# Patient Record
Sex: Male | Born: 1940 | Race: White | Hispanic: No | Marital: Married | Smoking: Former smoker
Health system: Southern US, Community
[De-identification: ages and names within clinical notes are randomized; demographics above are authoritative.]

## PROBLEM LIST (undated history)

## (undated) DIAGNOSIS — L02419 Cutaneous abscess of limb, unspecified: Secondary | ICD-10-CM

## (undated) DIAGNOSIS — I10 Essential (primary) hypertension: Secondary | ICD-10-CM

## (undated) DIAGNOSIS — E1142 Type 2 diabetes mellitus with diabetic polyneuropathy: Secondary | ICD-10-CM

## (undated) DIAGNOSIS — C9002 Multiple myeloma in relapse: Secondary | ICD-10-CM

## (undated) DIAGNOSIS — L03119 Cellulitis of unspecified part of limb: Secondary | ICD-10-CM

## (undated) DIAGNOSIS — N183 Chronic kidney disease, stage 3 unspecified: Secondary | ICD-10-CM

## (undated) DIAGNOSIS — K439 Ventral hernia without obstruction or gangrene: Secondary | ICD-10-CM

## (undated) DIAGNOSIS — M8448XA Pathological fracture, other site, initial encounter for fracture: Secondary | ICD-10-CM

## (undated) DIAGNOSIS — E1121 Type 2 diabetes mellitus with diabetic nephropathy: Secondary | ICD-10-CM

## (undated) DIAGNOSIS — R509 Fever, unspecified: Secondary | ICD-10-CM

## (undated) HISTORY — PX: BACK SURGERY: SHX140

## (undated) HISTORY — DX: Type 2 diabetes mellitus with diabetic nephropathy: E11.21

## (undated) HISTORY — DX: Type 2 diabetes mellitus with diabetic polyneuropathy: E11.42

## (undated) HISTORY — DX: Essential (primary) hypertension: I10

## (undated) HISTORY — DX: Multiple myeloma in relapse: C90.02

## (undated) HISTORY — DX: Pathological fracture, other site, initial encounter for fracture: M84.48XA

---

## 2006-09-08 ENCOUNTER — Encounter (INDEPENDENT_AMBULATORY_CARE_PROVIDER_SITE_OTHER): Payer: Self-pay | Admitting: *Deleted

## 2006-09-08 ENCOUNTER — Ambulatory Visit (HOSPITAL_COMMUNITY): Admission: RE | Admit: 2006-09-08 | Discharge: 2006-09-08 | Payer: Self-pay | Admitting: Neurosurgery

## 2006-09-08 ENCOUNTER — Ambulatory Visit: Payer: Self-pay | Admitting: Oncology

## 2006-09-11 LAB — CBC WITH DIFFERENTIAL/PLATELET
Basophils Absolute: 0 10*3/uL (ref 0.0–0.1)
EOS%: 0 % (ref 0.0–7.0)
Eosinophils Absolute: 0 10*3/uL (ref 0.0–0.5)
HGB: 11.9 g/dL — ABNORMAL LOW (ref 13.0–17.1)
LYMPH%: 5.1 % — ABNORMAL LOW (ref 14.0–48.0)
MCH: 29.2 pg (ref 28.0–33.4)
MCV: 85.3 fL (ref 81.6–98.0)
MONO%: 5.5 % (ref 0.0–13.0)
NEUT#: 12.4 10*3/uL — ABNORMAL HIGH (ref 1.5–6.5)
Platelets: 231 10*3/uL (ref 145–400)
RBC: 4.05 10*6/uL — ABNORMAL LOW (ref 4.20–5.71)
RDW: 14.2 % (ref 11.2–14.6)

## 2006-09-11 LAB — MORPHOLOGY

## 2006-09-11 LAB — ERYTHROCYTE SEDIMENTATION RATE: Sed Rate: 65 mm/hr — ABNORMAL HIGH (ref 0–20)

## 2006-09-11 LAB — CHCC SMEAR

## 2006-09-13 LAB — COMPREHENSIVE METABOLIC PANEL
AST: 16 U/L (ref 0–37)
Albumin: 3.1 g/dL — ABNORMAL LOW (ref 3.5–5.2)
Alkaline Phosphatase: 114 U/L (ref 39–117)
BUN: 65 mg/dL — ABNORMAL HIGH (ref 6–23)
Calcium: 8.4 mg/dL (ref 8.4–10.5)
Chloride: 98 mEq/L (ref 96–112)
Glucose, Bld: 336 mg/dL — ABNORMAL HIGH (ref 70–99)
Potassium: 4 mEq/L (ref 3.5–5.3)
Sodium: 133 mEq/L — ABNORMAL LOW (ref 135–145)
Total Protein: 6.8 g/dL (ref 6.0–8.3)

## 2006-09-13 LAB — ANA: Anti Nuclear Antibody(ANA): NEGATIVE

## 2006-09-13 LAB — IMMUNOFIXATION ELECTROPHORESIS
IgG (Immunoglobin G), Serum: 1720 mg/dL — ABNORMAL HIGH (ref 694–1618)
Total Protein, Serum Electrophoresis: 6.8 g/dL (ref 6.0–8.3)

## 2006-09-13 LAB — KAPPA/LAMBDA LIGHT CHAINS
Kappa:Lambda Ratio: 3.44 — ABNORMAL HIGH (ref 0.26–1.65)
Lambda Free Lght Chn: 3.08 mg/dL — ABNORMAL HIGH (ref 0.57–2.63)

## 2006-09-13 LAB — BETA 2 MICROGLOBULIN, SERUM: Beta-2 Microglobulin: 3.69 mg/L — ABNORMAL HIGH (ref 1.01–1.73)

## 2006-09-18 ENCOUNTER — Ambulatory Visit: Payer: Self-pay | Admitting: Oncology

## 2006-09-18 ENCOUNTER — Encounter (INDEPENDENT_AMBULATORY_CARE_PROVIDER_SITE_OTHER): Payer: Self-pay | Admitting: Specialist

## 2006-09-18 ENCOUNTER — Other Ambulatory Visit: Admission: RE | Admit: 2006-09-18 | Discharge: 2006-09-18 | Payer: Self-pay | Admitting: Oncology

## 2006-09-18 LAB — CBC WITH DIFFERENTIAL/PLATELET
BASO%: 0.3 % (ref 0.0–2.0)
EOS%: 1.1 % (ref 0.0–7.0)
HGB: 12.1 g/dL — ABNORMAL LOW (ref 13.0–17.1)
MCH: 29.5 pg (ref 28.0–33.4)
MCV: 84.7 fL (ref 81.6–98.0)
MONO%: 7.5 % (ref 0.0–13.0)
RBC: 4.09 10*6/uL — ABNORMAL LOW (ref 4.20–5.71)
RDW: 14.2 % (ref 11.2–14.6)
lymph#: 1.1 10*3/uL (ref 0.9–3.3)

## 2006-09-20 LAB — BASIC METABOLIC PANEL
Chloride: 102 mEq/L (ref 96–112)
Creatinine, Ser: 1.6 mg/dL — ABNORMAL HIGH (ref 0.40–1.50)
Sodium: 140 mEq/L (ref 135–145)

## 2006-09-21 ENCOUNTER — Ambulatory Visit: Admission: RE | Admit: 2006-09-21 | Discharge: 2006-11-17 | Payer: Self-pay | Admitting: Radiation Oncology

## 2006-09-21 LAB — UIFE/LIGHT CHAINS/TP QN, 24-HR UR
Albumin, U: DETECTED
Alpha 1, Urine: DETECTED — AB
Alpha 2, Urine: DETECTED — AB
Beta, Urine: DETECTED — AB
Free Kappa/Lambda Ratio: 10.16 ratio — ABNORMAL HIGH (ref 0.46–4.00)
Free Lt Chn Excr Rate: 410.13 mg/d
Total Protein, Urine-Ur/day: 1573 mg/d — ABNORMAL HIGH (ref 10–140)
Total Protein, Urine: 74 mg/dL

## 2006-09-21 LAB — CREATININE CLEARANCE, URINE, 24 HOUR
Collection Interval-CRCL: 24 hours
Creatinine Clearance: 84 mL/min (ref 75–125)
Creatinine: 1.6 mg/dL — ABNORMAL HIGH (ref 0.40–1.50)
Urine Total Volume-CRCL: 2125 mL

## 2006-10-04 ENCOUNTER — Ambulatory Visit (HOSPITAL_COMMUNITY): Admission: RE | Admit: 2006-10-04 | Discharge: 2006-10-04 | Payer: Self-pay | Admitting: Radiation Oncology

## 2006-10-09 LAB — CBC WITH DIFFERENTIAL/PLATELET
BASO%: 0.2 % (ref 0.0–2.0)
EOS%: 2 % (ref 0.0–7.0)
HCT: 35 % — ABNORMAL LOW (ref 38.7–49.9)
LYMPH%: 8.4 % — ABNORMAL LOW (ref 14.0–48.0)
MCH: 28.9 pg (ref 28.0–33.4)
MCHC: 34.4 g/dL (ref 32.0–35.9)
NEUT%: 86.6 % — ABNORMAL HIGH (ref 40.0–75.0)
lymph#: 0.9 10*3/uL (ref 0.9–3.3)

## 2006-10-09 LAB — PROTIME-INR: Protime: 14.4 Seconds — ABNORMAL HIGH (ref 10.6–13.4)

## 2006-10-09 LAB — BASIC METABOLIC PANEL
BUN: 35 mg/dL — ABNORMAL HIGH (ref 6–23)
CO2: 25 mEq/L (ref 19–32)
Chloride: 99 mEq/L (ref 96–112)
Glucose, Bld: 182 mg/dL — ABNORMAL HIGH (ref 70–99)
Potassium: 3.5 mEq/L (ref 3.5–5.3)
Sodium: 139 mEq/L (ref 135–145)

## 2006-10-16 LAB — PROTIME-INR: Protime: 16.8 Seconds — ABNORMAL HIGH (ref 10.6–13.4)

## 2006-10-16 LAB — BASIC METABOLIC PANEL
BUN: 38 mg/dL — ABNORMAL HIGH (ref 6–23)
CO2: 24 mEq/L (ref 19–32)
Calcium: 8.4 mg/dL (ref 8.4–10.5)
Glucose, Bld: 218 mg/dL — ABNORMAL HIGH (ref 70–99)
Potassium: 3.2 mEq/L — ABNORMAL LOW (ref 3.5–5.3)
Sodium: 134 mEq/L — ABNORMAL LOW (ref 135–145)

## 2006-10-16 LAB — CBC WITH DIFFERENTIAL/PLATELET
BASO%: 0.1 % (ref 0.0–2.0)
HCT: 31.7 % — ABNORMAL LOW (ref 38.7–49.9)
LYMPH%: 5.6 % — ABNORMAL LOW (ref 14.0–48.0)
MCHC: 34.4 g/dL (ref 32.0–35.9)
MCV: 83.6 fL (ref 81.6–98.0)
MONO%: 8.1 % (ref 0.0–13.0)
NEUT%: 84.4 % — ABNORMAL HIGH (ref 40.0–75.0)
Platelets: 137 10*3/uL — ABNORMAL LOW (ref 145–400)
RBC: 3.79 10*6/uL — ABNORMAL LOW (ref 4.20–5.71)

## 2006-10-19 ENCOUNTER — Ambulatory Visit: Payer: Self-pay | Admitting: Oncology

## 2006-10-23 LAB — COMPREHENSIVE METABOLIC PANEL
ALT: 19 U/L (ref 0–53)
AST: 13 U/L (ref 0–37)
Creatinine, Ser: 1.15 mg/dL (ref 0.40–1.50)
Total Bilirubin: 0.3 mg/dL (ref 0.3–1.2)

## 2006-10-23 LAB — CBC WITH DIFFERENTIAL/PLATELET
BASO%: 0.1 % (ref 0.0–2.0)
EOS%: 2.1 % (ref 0.0–7.0)
HCT: 32 % — ABNORMAL LOW (ref 38.7–49.9)
LYMPH%: 8.2 % — ABNORMAL LOW (ref 14.0–48.0)
MCH: 28.7 pg (ref 28.0–33.4)
MCHC: 34.3 g/dL (ref 32.0–35.9)
MONO#: 0.6 10*3/uL (ref 0.1–0.9)
NEUT%: 82.4 % — ABNORMAL HIGH (ref 40.0–75.0)
Platelets: 175 10*3/uL (ref 145–400)
RBC: 3.83 10*6/uL — ABNORMAL LOW (ref 4.20–5.71)
WBC: 8.3 10*3/uL (ref 4.0–10.0)

## 2006-10-23 LAB — PROTIME-INR
INR: 2 (ref 2.00–3.50)
Protime: 24 Seconds — ABNORMAL HIGH (ref 10.6–13.4)

## 2006-10-23 LAB — MORPHOLOGY: PLT EST: ADEQUATE

## 2006-10-23 LAB — LACTATE DEHYDROGENASE: LDH: 166 U/L (ref 94–250)

## 2006-10-30 LAB — BASIC METABOLIC PANEL
BUN: 27 mg/dL — ABNORMAL HIGH (ref 6–23)
CO2: 24 mEq/L (ref 19–32)
Chloride: 98 mEq/L (ref 96–112)
Creatinine, Ser: 1.2 mg/dL (ref 0.40–1.50)
Potassium: 3.7 mEq/L (ref 3.5–5.3)

## 2006-10-30 LAB — CBC WITH DIFFERENTIAL/PLATELET
Basophils Absolute: 0 10*3/uL (ref 0.0–0.1)
EOS%: 2 % (ref 0.0–7.0)
HCT: 32.4 % — ABNORMAL LOW (ref 38.7–49.9)
HGB: 11 g/dL — ABNORMAL LOW (ref 13.0–17.1)
MCH: 28.4 pg (ref 28.0–33.4)
MCV: 83.7 fL (ref 81.6–98.0)
NEUT%: 77.4 % — ABNORMAL HIGH (ref 40.0–75.0)
lymph#: 1.1 10*3/uL (ref 0.9–3.3)

## 2006-11-14 LAB — CBC WITH DIFFERENTIAL/PLATELET
Basophils Absolute: 0 10*3/uL (ref 0.0–0.1)
Eosinophils Absolute: 0.7 10*3/uL — ABNORMAL HIGH (ref 0.0–0.5)
HGB: 10.8 g/dL — ABNORMAL LOW (ref 13.0–17.1)
LYMPH%: 11.3 % — ABNORMAL LOW (ref 14.0–48.0)
MCV: 84.3 fL (ref 81.6–98.0)
MONO#: 0.6 10*3/uL (ref 0.1–0.9)
MONO%: 6.9 % (ref 0.0–13.0)
NEUT#: 6 10*3/uL (ref 1.5–6.5)
Platelets: 210 10*3/uL (ref 145–400)
WBC: 8.3 10*3/uL (ref 4.0–10.0)

## 2006-11-14 LAB — PROTHROMBIN TIME
INR: 1.2 (ref 0.0–1.5)
Prothrombin Time: 15.6 seconds — ABNORMAL HIGH (ref 11.6–15.2)

## 2006-11-21 LAB — CBC WITH DIFFERENTIAL/PLATELET
Basophils Absolute: 0.1 10*3/uL (ref 0.0–0.1)
EOS%: 5.7 % (ref 0.0–7.0)
Eosinophils Absolute: 0.4 10*3/uL (ref 0.0–0.5)
HCT: 32.1 % — ABNORMAL LOW (ref 38.7–49.9)
HGB: 10.9 g/dL — ABNORMAL LOW (ref 13.0–17.1)
MCH: 28.6 pg (ref 28.0–33.4)
MCV: 84.5 fL (ref 81.6–98.0)
NEUT#: 5.2 10*3/uL (ref 1.5–6.5)
NEUT%: 70.1 % (ref 40.0–75.0)
RDW: 16.4 % — ABNORMAL HIGH (ref 11.2–14.6)
lymph#: 0.9 10*3/uL (ref 0.9–3.3)

## 2006-11-21 LAB — MORPHOLOGY: PLT EST: ADEQUATE

## 2006-11-21 LAB — PROTHROMBIN TIME
INR: 2.3 — ABNORMAL HIGH (ref 0.0–1.5)
Prothrombin Time: 26.7 seconds — ABNORMAL HIGH (ref 11.6–15.2)

## 2006-11-24 LAB — COMPREHENSIVE METABOLIC PANEL
ALT: 11 U/L (ref 0–53)
AST: 8 U/L (ref 0–37)
Alkaline Phosphatase: 80 U/L (ref 39–117)
Creatinine, Ser: 1.44 mg/dL (ref 0.40–1.50)
Sodium: 134 mEq/L — ABNORMAL LOW (ref 135–145)
Total Bilirubin: 0.5 mg/dL (ref 0.3–1.2)
Total Protein: 5.8 g/dL — ABNORMAL LOW (ref 6.0–8.3)

## 2006-11-24 LAB — KAPPA/LAMBDA LIGHT CHAINS
Kappa free light chain: 6.58 mg/dL — ABNORMAL HIGH (ref 0.33–1.94)
Kappa:Lambda Ratio: 1.65 (ref 0.26–1.65)
Lambda Free Lght Chn: 3.99 mg/dL — ABNORMAL HIGH (ref 0.57–2.63)

## 2006-11-24 LAB — LACTATE DEHYDROGENASE: LDH: 113 U/L (ref 94–250)

## 2006-11-30 ENCOUNTER — Ambulatory Visit: Payer: Self-pay | Admitting: Oncology

## 2006-12-05 LAB — CBC WITH DIFFERENTIAL/PLATELET
BASO%: 0.2 % (ref 0.0–2.0)
EOS%: 1.5 % (ref 0.0–7.0)
HCT: 32.7 % — ABNORMAL LOW (ref 38.7–49.9)
MCH: 28.8 pg (ref 28.0–33.4)
MCHC: 34.4 g/dL (ref 32.0–35.9)
NEUT%: 79.7 % — ABNORMAL HIGH (ref 40.0–75.0)
RBC: 3.91 10*6/uL — ABNORMAL LOW (ref 4.20–5.71)
WBC: 7.2 10*3/uL (ref 4.0–10.0)
lymph#: 1.1 10*3/uL (ref 0.9–3.3)

## 2006-12-05 LAB — PROTIME-INR: INR: 2.7 (ref 2.00–3.50)

## 2006-12-05 LAB — MORPHOLOGY

## 2006-12-26 LAB — CBC WITH DIFFERENTIAL/PLATELET
BASO%: 0.8 % (ref 0.0–2.0)
EOS%: 2.2 % (ref 0.0–7.0)
HGB: 10.6 g/dL — ABNORMAL LOW (ref 13.0–17.1)
MCH: 28.2 pg (ref 28.0–33.4)
MCHC: 34.2 g/dL (ref 32.0–35.9)
RDW: 18.1 % — ABNORMAL HIGH (ref 11.2–14.6)
WBC: 4.8 10*3/uL (ref 4.0–10.0)
lymph#: 0.8 10*3/uL — ABNORMAL LOW (ref 0.9–3.3)

## 2006-12-26 LAB — MORPHOLOGY

## 2006-12-26 LAB — PROTIME-INR: INR: 1.9 — ABNORMAL LOW (ref 2.00–3.50)

## 2006-12-28 LAB — COMPREHENSIVE METABOLIC PANEL
BUN: 33 mg/dL — ABNORMAL HIGH (ref 6–23)
CO2: 30 mEq/L (ref 19–32)
Creatinine, Ser: 1.47 mg/dL (ref 0.40–1.50)
Glucose, Bld: 163 mg/dL — ABNORMAL HIGH (ref 70–99)
Total Bilirubin: 0.3 mg/dL (ref 0.3–1.2)

## 2006-12-28 LAB — KAPPA/LAMBDA LIGHT CHAINS
Kappa free light chain: 4.17 mg/dL — ABNORMAL HIGH (ref 0.33–1.94)
Kappa:Lambda Ratio: 1.55 (ref 0.26–1.65)
Lambda Free Lght Chn: 2.69 mg/dL — ABNORMAL HIGH (ref 0.57–2.63)

## 2006-12-28 LAB — LACTATE DEHYDROGENASE: LDH: 75 U/L — ABNORMAL LOW (ref 94–250)

## 2006-12-28 LAB — BETA 2 MICROGLOBULIN, SERUM: Beta-2 Microglobulin: 3.12 mg/L — ABNORMAL HIGH (ref 1.01–1.73)

## 2006-12-29 LAB — UIFE/LIGHT CHAINS/TP QN, 24-HR UR
Albumin, U: DETECTED
Alpha 1, Urine: DETECTED — AB
Alpha 2, Urine: DETECTED — AB
Beta, Urine: DETECTED — AB
Free Kappa Lt Chains,Ur: 12.2 mg/dL — ABNORMAL HIGH (ref 0.04–1.51)
Free Kappa/Lambda Ratio: 11.62 ratio — ABNORMAL HIGH (ref 0.46–4.00)
Free Lambda Excretion/Day: 25.99 mg/d
Free Lambda Lt Chains,Ur: 1.05 mg/dL — ABNORMAL HIGH (ref 0.08–1.01)
Free Lt Chn Excr Rate: 301.95 mg/d
Gamma Globulin, Urine: DETECTED — AB
Time: 24 hours
Total Protein, Urine-Ur/day: 1673 mg/d — ABNORMAL HIGH (ref 10–140)
Total Protein, Urine: 67.6 mg/dL
Volume, Urine: 2475 mL

## 2007-01-05 ENCOUNTER — Encounter: Admission: RE | Admit: 2007-01-05 | Discharge: 2007-01-05 | Payer: Self-pay | Admitting: Oncology

## 2007-01-22 ENCOUNTER — Ambulatory Visit: Payer: Self-pay | Admitting: Oncology

## 2007-01-24 LAB — PROTIME-INR

## 2007-02-05 ENCOUNTER — Inpatient Hospital Stay (HOSPITAL_COMMUNITY): Admission: RE | Admit: 2007-02-05 | Discharge: 2007-02-08 | Payer: Self-pay | Admitting: Neurosurgery

## 2007-02-05 ENCOUNTER — Encounter (INDEPENDENT_AMBULATORY_CARE_PROVIDER_SITE_OTHER): Payer: Self-pay | Admitting: *Deleted

## 2007-02-07 ENCOUNTER — Ambulatory Visit: Payer: Self-pay | Admitting: Oncology

## 2007-02-19 LAB — MORPHOLOGY

## 2007-02-19 LAB — CBC WITH DIFFERENTIAL/PLATELET
BASO%: 0 % (ref 0.0–2.0)
HCT: 27.6 % — ABNORMAL LOW (ref 38.7–49.9)
MCHC: 35.1 g/dL (ref 32.0–35.9)
MONO#: 0.7 10*3/uL (ref 0.1–0.9)
NEUT%: 79.8 % — ABNORMAL HIGH (ref 40.0–75.0)
RBC: 3.27 10*6/uL — ABNORMAL LOW (ref 4.20–5.71)
WBC: 7.7 10*3/uL (ref 4.0–10.0)
lymph#: 0.8 10*3/uL — ABNORMAL LOW (ref 0.9–3.3)

## 2007-02-22 LAB — KAPPA/LAMBDA LIGHT CHAINS
Kappa:Lambda Ratio: 2.07 — ABNORMAL HIGH (ref 0.26–1.65)
Lambda Free Lght Chn: 2.21 mg/dL (ref 0.57–2.63)

## 2007-02-22 LAB — COMPREHENSIVE METABOLIC PANEL
AST: 12 U/L (ref 0–37)
Albumin: 3.3 g/dL — ABNORMAL LOW (ref 3.5–5.2)
Alkaline Phosphatase: 97 U/L (ref 39–117)
Calcium: 8.7 mg/dL (ref 8.4–10.5)
Chloride: 102 mEq/L (ref 96–112)
Glucose, Bld: 214 mg/dL — ABNORMAL HIGH (ref 70–99)
Potassium: 4 mEq/L (ref 3.5–5.3)
Sodium: 139 mEq/L (ref 135–145)
Total Protein: 6 g/dL (ref 6.0–8.3)

## 2007-02-22 LAB — PROTHROMBIN TIME: Prothrombin Time: 17.5 seconds — ABNORMAL HIGH (ref 11.6–15.2)

## 2007-02-22 LAB — IMMUNOFIXATION ELECTROPHORESIS

## 2007-02-22 LAB — BETA 2 MICROGLOBULIN, SERUM: Beta-2 Microglobulin: 3.11 mg/L — ABNORMAL HIGH (ref 1.01–1.73)

## 2007-02-27 LAB — PROTIME-INR
INR: 2.4 (ref 2.00–3.50)
Protime: 28.8 Seconds — ABNORMAL HIGH (ref 10.6–13.4)

## 2007-03-09 ENCOUNTER — Ambulatory Visit: Payer: Self-pay | Admitting: Oncology

## 2007-03-13 LAB — CBC WITH DIFFERENTIAL/PLATELET
EOS%: 2 % (ref 0.0–7.0)
MCH: 29.6 pg (ref 28.0–33.4)
MCV: 83.8 fL (ref 81.6–98.0)
MONO%: 7.4 % (ref 0.0–13.0)
RBC: 3.5 10*6/uL — ABNORMAL LOW (ref 4.20–5.71)
RDW: 16.3 % — ABNORMAL HIGH (ref 11.2–14.6)

## 2007-03-13 LAB — PROTIME-INR
INR: 4.2 — ABNORMAL HIGH (ref 2.00–3.50)
Protime: 50.4 Seconds — ABNORMAL HIGH (ref 10.6–13.4)

## 2007-03-20 ENCOUNTER — Encounter: Admission: RE | Admit: 2007-03-20 | Discharge: 2007-03-20 | Payer: Self-pay | Admitting: Neurosurgery

## 2007-03-20 LAB — PROTIME-INR

## 2007-03-26 LAB — CBC WITH DIFFERENTIAL/PLATELET
Basophils Absolute: 0.1 10*3/uL (ref 0.0–0.1)
EOS%: 0.4 % (ref 0.0–7.0)
Eosinophils Absolute: 0 10*3/uL (ref 0.0–0.5)
HCT: 33.9 % — ABNORMAL LOW (ref 38.7–49.9)
HGB: 11.5 g/dL — ABNORMAL LOW (ref 13.0–17.1)
MCH: 28.7 pg (ref 28.0–33.4)
NEUT%: 83.9 % — ABNORMAL HIGH (ref 40.0–75.0)
lymph#: 0.6 10*3/uL — ABNORMAL LOW (ref 0.9–3.3)

## 2007-03-26 LAB — PROTIME-INR

## 2007-03-30 LAB — PROTIME-INR: INR: 1.9 — ABNORMAL LOW (ref 2.00–3.50)

## 2007-04-03 LAB — KAPPA/LAMBDA LIGHT CHAINS
Kappa free light chain: 3.16 mg/dL — ABNORMAL HIGH (ref 0.33–1.94)
Kappa:Lambda Ratio: 0.99 (ref 0.26–1.65)
Lambda Free Lght Chn: 3.2 mg/dL — ABNORMAL HIGH (ref 0.57–2.63)

## 2007-04-03 LAB — COMPREHENSIVE METABOLIC PANEL
Albumin: 3.3 g/dL — ABNORMAL LOW (ref 3.5–5.2)
Alkaline Phosphatase: 91 U/L (ref 39–117)
BUN: 36 mg/dL — ABNORMAL HIGH (ref 6–23)
CO2: 25 mEq/L (ref 19–32)
Calcium: 8.2 mg/dL — ABNORMAL LOW (ref 8.4–10.5)
Creatinine, Ser: 1.59 mg/dL — ABNORMAL HIGH (ref 0.40–1.50)
Sodium: 139 mEq/L (ref 135–145)

## 2007-04-10 LAB — CBC WITH DIFFERENTIAL/PLATELET
BASO%: 0.8 % (ref 0.0–2.0)
EOS%: 1.7 % (ref 0.0–7.0)
MCH: 28.9 pg (ref 28.0–33.4)
MCV: 84 fL (ref 81.6–98.0)
MONO%: 9.8 % (ref 0.0–13.0)
RBC: 3.91 10*6/uL — ABNORMAL LOW (ref 4.20–5.71)
RDW: 13.4 % (ref 11.2–14.6)

## 2007-04-17 ENCOUNTER — Encounter: Admission: RE | Admit: 2007-04-17 | Discharge: 2007-04-17 | Payer: Self-pay | Admitting: Neurosurgery

## 2007-04-23 LAB — URINALYSIS, MICROSCOPIC - CHCC
Bilirubin (Urine): NEGATIVE
Ketones: NEGATIVE mg/dL
Specific Gravity, Urine: 1.02 (ref 1.003–1.035)

## 2007-04-23 LAB — CBC WITH DIFFERENTIAL/PLATELET
BASO%: 0.8 % (ref 0.0–2.0)
Basophils Absolute: 0.1 10*3/uL (ref 0.0–0.1)
HCT: 35.9 % — ABNORMAL LOW (ref 38.7–49.9)
HGB: 12.2 g/dL — ABNORMAL LOW (ref 13.0–17.1)
LYMPH%: 8.6 % — ABNORMAL LOW (ref 14.0–48.0)
MCHC: 34.1 g/dL (ref 32.0–35.9)
MONO#: 0.1 10*3/uL (ref 0.1–0.9)
NEUT%: 87.3 % — ABNORMAL HIGH (ref 40.0–75.0)
Platelets: 133 10*3/uL — ABNORMAL LOW (ref 145–400)
WBC: 6.4 10*3/uL (ref 4.0–10.0)
lymph#: 0.5 10*3/uL — ABNORMAL LOW (ref 0.9–3.3)

## 2007-04-23 LAB — PROTIME-INR

## 2007-04-25 LAB — URINE CULTURE

## 2007-05-07 ENCOUNTER — Ambulatory Visit: Payer: Self-pay | Admitting: Oncology

## 2007-05-08 LAB — CBC WITH DIFFERENTIAL/PLATELET
Basophils Absolute: 0.1 10*3/uL (ref 0.0–0.1)
Eosinophils Absolute: 0.1 10*3/uL (ref 0.0–0.5)
HGB: 11.9 g/dL — ABNORMAL LOW (ref 13.0–17.1)
MCV: 81.6 fL (ref 81.6–98.0)
MONO%: 13.2 % — ABNORMAL HIGH (ref 0.0–13.0)
NEUT#: 3.6 10*3/uL (ref 1.5–6.5)
RBC: 4.07 10*6/uL — ABNORMAL LOW (ref 4.20–5.71)
RDW: 13.2 % (ref 11.2–14.6)
WBC: 5.2 10*3/uL (ref 4.0–10.0)
lymph#: 0.7 10*3/uL — ABNORMAL LOW (ref 0.9–3.3)

## 2007-05-08 LAB — PROTIME-INR: Protime: 18 Seconds — ABNORMAL HIGH (ref 10.6–13.4)

## 2007-05-15 LAB — CBC WITH DIFFERENTIAL/PLATELET
Basophils Absolute: 0.1 10*3/uL (ref 0.0–0.1)
Eosinophils Absolute: 0.2 10*3/uL (ref 0.0–0.5)
HGB: 12 g/dL — ABNORMAL LOW (ref 13.0–17.1)
LYMPH%: 10.7 % — ABNORMAL LOW (ref 14.0–48.0)
MCV: 82.4 fL (ref 81.6–98.0)
MONO#: 0.9 10*3/uL (ref 0.1–0.9)
NEUT#: 4.9 10*3/uL (ref 1.5–6.5)
Platelets: 173 10*3/uL (ref 145–400)
RBC: 4.17 10*6/uL — ABNORMAL LOW (ref 4.20–5.71)
RDW: 13.1 % (ref 11.2–14.6)
WBC: 6.8 10*3/uL (ref 4.0–10.0)

## 2007-05-15 LAB — MORPHOLOGY: PLT EST: ADEQUATE

## 2007-05-15 LAB — PROTIME-INR: Protime: 26.4 Seconds — ABNORMAL HIGH (ref 10.6–13.4)

## 2007-05-29 LAB — CBC WITH DIFFERENTIAL/PLATELET
BASO%: 0.6 % (ref 0.0–2.0)
EOS%: 0.3 % (ref 0.0–7.0)
HCT: 33.1 % — ABNORMAL LOW (ref 38.7–49.9)
LYMPH%: 10.5 % — ABNORMAL LOW (ref 14.0–48.0)
MCH: 29.3 pg (ref 28.0–33.4)
MCHC: 35 g/dL (ref 32.0–35.9)
MCV: 83.7 fL (ref 81.6–98.0)
MONO%: 10.8 % (ref 0.0–13.0)
NEUT%: 77.8 % — ABNORMAL HIGH (ref 40.0–75.0)
Platelets: 153 10*3/uL (ref 145–400)
RBC: 3.95 10*6/uL — ABNORMAL LOW (ref 4.20–5.71)

## 2007-05-29 LAB — PROTIME-INR
INR: 2.8 (ref 2.00–3.50)
Protime: 33.6 s — ABNORMAL HIGH (ref 10.6–13.4)

## 2007-05-29 LAB — MORPHOLOGY: PLT EST: ADEQUATE

## 2007-06-12 LAB — CBC WITH DIFFERENTIAL/PLATELET
Eosinophils Absolute: 0 10*3/uL (ref 0.0–0.5)
HCT: 33.4 % — ABNORMAL LOW (ref 38.7–49.9)
LYMPH%: 6.7 % — ABNORMAL LOW (ref 14.0–48.0)
MONO#: 0.8 10*3/uL (ref 0.1–0.9)
NEUT#: 6 10*3/uL (ref 1.5–6.5)
NEUT%: 82.1 % — ABNORMAL HIGH (ref 40.0–75.0)
Platelets: 137 10*3/uL — ABNORMAL LOW (ref 145–400)
WBC: 7.3 10*3/uL (ref 4.0–10.0)
lymph#: 0.5 10*3/uL — ABNORMAL LOW (ref 0.9–3.3)

## 2007-06-12 LAB — PROTIME-INR

## 2007-06-13 LAB — COMPREHENSIVE METABOLIC PANEL
ALT: 24 U/L (ref 0–53)
Albumin: 3.5 g/dL (ref 3.5–5.2)
Alkaline Phosphatase: 91 U/L (ref 39–117)
CO2: 20 mEq/L (ref 19–32)
Glucose, Bld: 283 mg/dL — ABNORMAL HIGH (ref 70–99)
Potassium: 4.1 mEq/L (ref 3.5–5.3)
Sodium: 137 mEq/L (ref 135–145)
Total Bilirubin: 1 mg/dL (ref 0.3–1.2)
Total Protein: 6 g/dL (ref 6.0–8.3)

## 2007-06-13 LAB — PROTIME-INR
INR: 2.9 (ref 2.00–3.50)
Protime: 34.8 Seconds — ABNORMAL HIGH (ref 10.6–13.4)

## 2007-06-27 ENCOUNTER — Encounter: Admission: RE | Admit: 2007-06-27 | Discharge: 2007-06-27 | Payer: Self-pay | Admitting: Neurosurgery

## 2007-06-27 ENCOUNTER — Ambulatory Visit: Payer: Self-pay | Admitting: Oncology

## 2007-06-27 LAB — PROTIME-INR: INR: 1.8 — ABNORMAL LOW (ref 2.00–3.50)

## 2007-07-23 ENCOUNTER — Encounter: Admission: RE | Admit: 2007-07-23 | Discharge: 2007-07-23 | Payer: Self-pay | Admitting: Neurosurgery

## 2007-07-24 ENCOUNTER — Ambulatory Visit (HOSPITAL_COMMUNITY): Admission: RE | Admit: 2007-07-24 | Discharge: 2007-07-24 | Payer: Self-pay | Admitting: Oncology

## 2007-07-24 LAB — CBC WITH DIFFERENTIAL/PLATELET
Eosinophils Absolute: 0 10*3/uL (ref 0.0–0.5)
HCT: 33.8 % — ABNORMAL LOW (ref 38.7–49.9)
LYMPH%: 8.6 % — ABNORMAL LOW (ref 14.0–48.0)
MCH: 30.9 pg (ref 28.0–33.4)
MCHC: 35.8 g/dL (ref 32.0–35.9)
MCV: 86.3 fL (ref 81.6–98.0)
MONO#: 0.6 10*3/uL (ref 0.1–0.9)

## 2007-07-24 LAB — MORPHOLOGY: PLT EST: ADEQUATE

## 2007-07-24 LAB — PROTIME-INR
INR: 1.9 — ABNORMAL LOW (ref 2.00–3.50)
Protime: 22.8 Seconds — ABNORMAL HIGH (ref 10.6–13.4)

## 2007-07-26 LAB — COMPREHENSIVE METABOLIC PANEL
ALT: 29 U/L (ref 0–53)
AST: 10 U/L (ref 0–37)
Albumin: 3.6 g/dL (ref 3.5–5.2)
Alkaline Phosphatase: 84 U/L (ref 39–117)
BUN: 39 mg/dL — ABNORMAL HIGH (ref 6–23)
Calcium: 8.6 mg/dL (ref 8.4–10.5)
Chloride: 103 mEq/L (ref 96–112)
Potassium: 4.1 mEq/L (ref 3.5–5.3)
Sodium: 139 mEq/L (ref 135–145)
Total Protein: 6.3 g/dL (ref 6.0–8.3)

## 2007-07-26 LAB — KAPPA/LAMBDA LIGHT CHAINS
Kappa free light chain: 1.68 mg/dL (ref 0.33–1.94)
Kappa:Lambda Ratio: 0.6 (ref 0.26–1.65)
Lambda Free Lght Chn: 2.78 mg/dL — ABNORMAL HIGH (ref 0.57–2.63)

## 2007-07-26 LAB — IMMUNOFIXATION ELECTROPHORESIS

## 2007-07-31 LAB — CBC WITH DIFFERENTIAL/PLATELET
BASO%: 0.8 % (ref 0.0–2.0)
HCT: 34.3 % — ABNORMAL LOW (ref 38.7–49.9)
LYMPH%: 10.3 % — ABNORMAL LOW (ref 14.0–48.0)
MCHC: 34.9 g/dL (ref 32.0–35.9)
MCV: 84.9 fL (ref 81.6–98.0)
MONO#: 0.7 10*3/uL (ref 0.1–0.9)
MONO%: 13.7 % — ABNORMAL HIGH (ref 0.0–13.0)
NEUT%: 74.8 % (ref 40.0–75.0)
Platelets: 128 10*3/uL — ABNORMAL LOW (ref 145–400)
WBC: 5.2 10*3/uL (ref 4.0–10.0)

## 2007-07-31 LAB — UIFE/LIGHT CHAINS/TP QN, 24-HR UR
Albumin, U: DETECTED
Beta, Urine: DETECTED — AB
Free Kappa Lt Chains,Ur: 11 mg/dL — ABNORMAL HIGH (ref 0.04–1.51)
Free Lambda Excretion/Day: 48.24 mg/d
Free Lambda Lt Chains,Ur: 1.34 mg/dL — ABNORMAL HIGH (ref 0.08–1.01)
Free Lt Chn Excr Rate: 396 mg/d
Gamma Globulin, Urine: DETECTED — AB
Time: 24 hours
Volume, Urine: 3600 mL

## 2007-07-31 LAB — MORPHOLOGY: PLT EST: DECREASED

## 2007-07-31 LAB — PROTIME-INR: INR: 1.7 — ABNORMAL LOW (ref 2.00–3.50)

## 2007-08-14 ENCOUNTER — Ambulatory Visit: Payer: Self-pay | Admitting: Oncology

## 2007-08-14 LAB — CBC WITH DIFFERENTIAL/PLATELET
BASO%: 0.3 % (ref 0.0–2.0)
EOS%: 0.9 % (ref 0.0–7.0)
LYMPH%: 8.4 % — ABNORMAL LOW (ref 14.0–48.0)
MCHC: 35.4 g/dL (ref 32.0–35.9)
MCV: 86.7 fL (ref 81.6–98.0)
MONO%: 12.9 % (ref 0.0–13.0)
Platelets: 160 10*3/uL (ref 145–400)
RBC: 3.8 10*6/uL — ABNORMAL LOW (ref 4.20–5.71)
RDW: 17.9 % — ABNORMAL HIGH (ref 11.2–14.6)

## 2007-08-14 LAB — MORPHOLOGY: PLT EST: ADEQUATE

## 2007-08-14 LAB — PROTHROMBIN TIME: Prothrombin Time: 21 seconds — ABNORMAL HIGH (ref 11.6–15.2)

## 2007-08-28 LAB — BASIC METABOLIC PANEL
BUN: 31 mg/dL — ABNORMAL HIGH (ref 6–23)
CO2: 23 mEq/L (ref 19–32)
Calcium: 8.8 mg/dL (ref 8.4–10.5)
Creatinine, Ser: 1.7 mg/dL — ABNORMAL HIGH (ref 0.40–1.50)
Glucose, Bld: 410 mg/dL — ABNORMAL HIGH (ref 70–99)

## 2007-08-28 LAB — CBC WITH DIFFERENTIAL/PLATELET
Basophils Absolute: 0 10*3/uL (ref 0.0–0.1)
EOS%: 0.2 % (ref 0.0–7.0)
HCT: 35.5 % — ABNORMAL LOW (ref 38.7–49.9)
HGB: 12.5 g/dL — ABNORMAL LOW (ref 13.0–17.1)
LYMPH%: 6.6 % — ABNORMAL LOW (ref 14.0–48.0)
MCH: 30.6 pg (ref 28.0–33.4)
MCV: 86.8 fL (ref 81.6–98.0)
MONO%: 11.1 % (ref 0.0–13.0)
NEUT%: 82 % — ABNORMAL HIGH (ref 40.0–75.0)
Platelets: 161 10*3/uL (ref 145–400)

## 2007-08-28 LAB — MORPHOLOGY: PLT EST: ADEQUATE

## 2007-08-28 LAB — PROTIME-INR: Protime: 56.4 Seconds — ABNORMAL HIGH (ref 10.6–13.4)

## 2007-08-30 LAB — PROTIME-INR
INR: 2.7 (ref 2.00–3.50)
Protime: 32.4 Seconds — ABNORMAL HIGH (ref 10.6–13.4)

## 2007-09-03 LAB — PROTIME-INR: INR: 1.7 — ABNORMAL LOW (ref 2.00–3.50)

## 2007-09-11 LAB — CBC WITH DIFFERENTIAL/PLATELET
BASO%: 0.2 % (ref 0.0–2.0)
EOS%: 0.3 % (ref 0.0–7.0)
HCT: 35.3 % — ABNORMAL LOW (ref 38.7–49.9)
MCH: 30.5 pg (ref 28.0–33.4)
MCHC: 35 g/dL (ref 32.0–35.9)
NEUT%: 83.5 % — ABNORMAL HIGH (ref 40.0–75.0)
RBC: 4.06 10*6/uL — ABNORMAL LOW (ref 4.20–5.71)
WBC: 5.7 10*3/uL (ref 4.0–10.0)
lymph#: 0.4 10*3/uL — ABNORMAL LOW (ref 0.9–3.3)

## 2007-09-11 LAB — COMPREHENSIVE METABOLIC PANEL
ALT: 27 U/L (ref 0–53)
Alkaline Phosphatase: 64 U/L (ref 39–117)
CO2: 21 mEq/L (ref 19–32)
Creatinine, Ser: 1.56 mg/dL — ABNORMAL HIGH (ref 0.40–1.50)
Glucose, Bld: 288 mg/dL — ABNORMAL HIGH (ref 70–99)
Total Bilirubin: 0.4 mg/dL (ref 0.3–1.2)

## 2007-09-11 LAB — PROTIME-INR: Protime: 18 Seconds — ABNORMAL HIGH (ref 10.6–13.4)

## 2007-09-12 LAB — IGG: IgG (Immunoglobin G), Serum: 633 mg/dL — ABNORMAL LOW (ref 694–1618)

## 2007-09-12 LAB — KAPPA/LAMBDA LIGHT CHAINS: Kappa free light chain: 1.21 mg/dL (ref 0.33–1.94)

## 2007-09-20 LAB — PROTIME-INR: INR: 1.6 — ABNORMAL LOW (ref 2.00–3.50)

## 2007-09-25 LAB — BASIC METABOLIC PANEL
BUN: 35 mg/dL — ABNORMAL HIGH (ref 6–23)
Chloride: 101 mEq/L (ref 96–112)
Potassium: 4 mEq/L (ref 3.5–5.3)
Sodium: 138 mEq/L (ref 135–145)

## 2007-09-30 ENCOUNTER — Ambulatory Visit: Payer: Self-pay | Admitting: Oncology

## 2007-10-03 LAB — PROTIME-INR: INR: 1.6 — ABNORMAL LOW (ref 2.00–3.50)

## 2007-10-18 LAB — PROTIME-INR
INR: 1.9 — ABNORMAL LOW (ref 2.00–3.50)
Protime: 22.8 Seconds — ABNORMAL HIGH (ref 10.6–13.4)

## 2007-10-23 LAB — COMPREHENSIVE METABOLIC PANEL
Albumin: 3.1 g/dL — ABNORMAL LOW (ref 3.5–5.2)
Alkaline Phosphatase: 71 U/L (ref 39–117)
CO2: 21 mEq/L (ref 19–32)
Calcium: 8.7 mg/dL (ref 8.4–10.5)
Chloride: 100 mEq/L (ref 96–112)
Glucose, Bld: 544 mg/dL (ref 70–99)
Potassium: 3.7 mEq/L (ref 3.5–5.3)
Sodium: 135 mEq/L (ref 135–145)
Total Protein: 6.2 g/dL (ref 6.0–8.3)

## 2007-10-23 LAB — CBC WITH DIFFERENTIAL/PLATELET
BASO%: 0.1 % (ref 0.0–2.0)
EOS%: 0 % (ref 0.0–7.0)
LYMPH%: 9 % — ABNORMAL LOW (ref 14.0–48.0)
MCHC: 35.2 g/dL (ref 32.0–35.9)
MCV: 86.2 fL (ref 81.6–98.0)
MONO%: 6.3 % (ref 0.0–13.0)
Platelets: 136 10*3/uL — ABNORMAL LOW (ref 145–400)
RBC: 4.18 10*6/uL — ABNORMAL LOW (ref 4.20–5.71)
RDW: 18 % — ABNORMAL HIGH (ref 11.2–14.6)

## 2007-10-23 LAB — PROTIME-INR: Protime: 24 Seconds — ABNORMAL HIGH (ref 10.6–13.4)

## 2007-10-24 LAB — KAPPA/LAMBDA LIGHT CHAINS: Kappa free light chain: 1.17 mg/dL (ref 0.33–1.94)

## 2007-11-16 ENCOUNTER — Ambulatory Visit: Payer: Self-pay | Admitting: Oncology

## 2007-11-20 LAB — BASIC METABOLIC PANEL
BUN: 39 mg/dL — ABNORMAL HIGH (ref 6–23)
Chloride: 102 mEq/L (ref 96–112)
Glucose, Bld: 267 mg/dL — ABNORMAL HIGH (ref 70–99)
Potassium: 3.5 mEq/L (ref 3.5–5.3)

## 2007-11-27 LAB — CBC WITH DIFFERENTIAL/PLATELET
Basophils Absolute: 0.1 10*3/uL (ref 0.0–0.1)
Eosinophils Absolute: 0.1 10*3/uL (ref 0.0–0.5)
HCT: 35.4 % — ABNORMAL LOW (ref 38.7–49.9)
HGB: 12.3 g/dL — ABNORMAL LOW (ref 13.0–17.1)
LYMPH%: 13.7 % — ABNORMAL LOW (ref 14.0–48.0)
MCV: 86.1 fL (ref 81.6–98.0)
MONO%: 6.1 % (ref 0.0–13.0)
NEUT#: 4.7 10*3/uL (ref 1.5–6.5)
NEUT%: 78 % — ABNORMAL HIGH (ref 40.0–75.0)
Platelets: 133 10*3/uL — ABNORMAL LOW (ref 145–400)

## 2007-11-27 LAB — COMPREHENSIVE METABOLIC PANEL
Albumin: 3.5 g/dL (ref 3.5–5.2)
Alkaline Phosphatase: 57 U/L (ref 39–117)
BUN: 34 mg/dL — ABNORMAL HIGH (ref 6–23)
Glucose, Bld: 213 mg/dL — ABNORMAL HIGH (ref 70–99)
Potassium: 3.5 mEq/L (ref 3.5–5.3)
Total Bilirubin: 0.5 mg/dL (ref 0.3–1.2)

## 2007-12-03 LAB — UIFE/LIGHT CHAINS/TP QN, 24-HR UR
Albumin, U: DETECTED
Alpha 1, Urine: DETECTED — AB
Alpha 2, Urine: DETECTED — AB
Beta, Urine: DETECTED — AB
Free Kappa/Lambda Ratio: 4.09 ratio — ABNORMAL HIGH (ref 0.46–4.00)
Free Lt Chn Excr Rate: 281.3 mg/d
Total Protein, Urine-Ur/day: 2894 mg/d — ABNORMAL HIGH (ref 10–140)
Total Protein, Urine: 99.8 mg/dL

## 2007-12-03 LAB — CREATININE CLEARANCE, URINE, 24 HOUR
Collection Interval-CRCL: 24 hours
Creatinine Clearance: 100 mL/min (ref 75–125)
Creatinine: 1.62 mg/dL — ABNORMAL HIGH (ref 0.40–1.50)
Urine Total Volume-CRCL: 2900 mL

## 2007-12-04 LAB — PROTIME-INR
INR: 2.1 (ref 2.00–3.50)
Protime: 25.2 Seconds — ABNORMAL HIGH (ref 10.6–13.4)

## 2007-12-18 LAB — BASIC METABOLIC PANEL
BUN: 41 mg/dL — ABNORMAL HIGH (ref 6–23)
CO2: 18 mEq/L — ABNORMAL LOW (ref 19–32)
Chloride: 103 mEq/L (ref 96–112)
Creatinine, Ser: 1.44 mg/dL (ref 0.40–1.50)
Glucose, Bld: 291 mg/dL — ABNORMAL HIGH (ref 70–99)

## 2008-01-01 ENCOUNTER — Ambulatory Visit: Payer: Self-pay | Admitting: Oncology

## 2008-01-01 LAB — CBC WITH DIFFERENTIAL/PLATELET
Basophils Absolute: 0 10*3/uL (ref 0.0–0.1)
Eosinophils Absolute: 0 10*3/uL (ref 0.0–0.5)
HCT: 34.7 % — ABNORMAL LOW (ref 38.7–49.9)
HGB: 11.9 g/dL — ABNORMAL LOW (ref 13.0–17.1)
LYMPH%: 6 % — ABNORMAL LOW (ref 14.0–48.0)
MCV: 86.9 fL (ref 81.6–98.0)
MONO#: 0.5 10*3/uL (ref 0.1–0.9)
MONO%: 9.4 % (ref 0.0–13.0)
NEUT#: 4.9 10*3/uL (ref 1.5–6.5)
Platelets: 111 10*3/uL — ABNORMAL LOW (ref 145–400)

## 2008-01-01 LAB — MORPHOLOGY: PLT EST: DECREASED

## 2008-01-02 LAB — COMPREHENSIVE METABOLIC PANEL
ALT: 30 U/L (ref 0–53)
BUN: 41 mg/dL — ABNORMAL HIGH (ref 6–23)
CO2: 21 mEq/L (ref 19–32)
Calcium: 8.5 mg/dL (ref 8.4–10.5)
Chloride: 102 mEq/L (ref 96–112)
Creatinine, Ser: 1.75 mg/dL — ABNORMAL HIGH (ref 0.40–1.50)
Glucose, Bld: 213 mg/dL — ABNORMAL HIGH (ref 70–99)

## 2008-01-02 LAB — LACTATE DEHYDROGENASE: LDH: 85 U/L — ABNORMAL LOW (ref 94–250)

## 2008-01-02 LAB — KAPPA/LAMBDA LIGHT CHAINS
Kappa:Lambda Ratio: 0.63 (ref 0.26–1.65)
Lambda Free Lght Chn: 3.33 mg/dL — ABNORMAL HIGH (ref 0.57–2.63)

## 2008-01-08 ENCOUNTER — Ambulatory Visit: Payer: Self-pay | Admitting: Oncology

## 2008-01-15 ENCOUNTER — Ambulatory Visit (HOSPITAL_COMMUNITY): Admission: RE | Admit: 2008-01-15 | Discharge: 2008-01-15 | Payer: Self-pay | Admitting: Oncology

## 2008-01-15 LAB — CBC WITH DIFFERENTIAL/PLATELET
Basophils Absolute: 0 10*3/uL (ref 0.0–0.1)
HCT: 34.3 % — ABNORMAL LOW (ref 38.7–49.9)
HGB: 11.8 g/dL — ABNORMAL LOW (ref 13.0–17.1)
LYMPH%: 9.7 % — ABNORMAL LOW (ref 14.0–48.0)
MCH: 30.1 pg (ref 28.0–33.4)
MCHC: 34.4 g/dL (ref 32.0–35.9)
MONO#: 0.5 10*3/uL (ref 0.1–0.9)
NEUT%: 79 % — ABNORMAL HIGH (ref 40.0–75.0)
Platelets: 121 10*3/uL — ABNORMAL LOW (ref 145–400)
WBC: 5.4 10*3/uL (ref 4.0–10.0)
lymph#: 0.5 10*3/uL — ABNORMAL LOW (ref 0.9–3.3)

## 2008-01-15 LAB — BASIC METABOLIC PANEL
CO2: 20 mEq/L (ref 19–32)
Chloride: 104 mEq/L (ref 96–112)
Glucose, Bld: 307 mg/dL — ABNORMAL HIGH (ref 70–99)
Sodium: 133 mEq/L — ABNORMAL LOW (ref 135–145)

## 2008-01-15 LAB — PROTIME-INR

## 2008-02-12 LAB — PROTIME-INR: Protime: 20.4 Seconds — ABNORMAL HIGH (ref 10.6–13.4)

## 2008-02-12 LAB — BASIC METABOLIC PANEL
BUN: 42 mg/dL — ABNORMAL HIGH (ref 6–23)
Calcium: 8.7 mg/dL (ref 8.4–10.5)
Creatinine, Ser: 1.73 mg/dL — ABNORMAL HIGH (ref 0.40–1.50)
Glucose, Bld: 295 mg/dL — ABNORMAL HIGH (ref 70–99)
Potassium: 4 mEq/L (ref 3.5–5.3)

## 2008-02-19 LAB — CBC WITH DIFFERENTIAL/PLATELET
BASO%: 0 % (ref 0.0–2.0)
LYMPH%: 6.9 % — ABNORMAL LOW (ref 14.0–48.0)
MCHC: 35.1 g/dL (ref 32.0–35.9)
MONO#: 0.6 10*3/uL (ref 0.1–0.9)
MONO%: 10.2 % (ref 0.0–13.0)
Platelets: 136 10*3/uL — ABNORMAL LOW (ref 145–400)
RBC: 3.71 10*6/uL — ABNORMAL LOW (ref 4.20–5.71)
WBC: 6.3 10*3/uL (ref 4.0–10.0)

## 2008-02-20 LAB — IGG: IgG (Immunoglobin G), Serum: 756 mg/dL (ref 694–1618)

## 2008-02-20 LAB — KAPPA/LAMBDA LIGHT CHAINS: Kappa:Lambda Ratio: 0.93 (ref 0.26–1.65)

## 2008-02-29 ENCOUNTER — Ambulatory Visit: Payer: Self-pay | Admitting: Oncology

## 2008-03-11 LAB — BASIC METABOLIC PANEL
BUN: 57 mg/dL — ABNORMAL HIGH (ref 6–23)
Calcium: 8.8 mg/dL (ref 8.4–10.5)
Creatinine, Ser: 2.14 mg/dL — ABNORMAL HIGH (ref 0.40–1.50)

## 2008-03-11 LAB — PROTIME-INR
INR: 1.6 — ABNORMAL LOW (ref 2.00–3.50)
Protime: 19.2 Seconds — ABNORMAL HIGH (ref 10.6–13.4)

## 2008-03-11 LAB — CBC WITH DIFFERENTIAL/PLATELET
Basophils Absolute: 0 10*3/uL (ref 0.0–0.1)
MCH: 30.2 pg (ref 28.0–33.4)
MCHC: 35 g/dL (ref 32.0–35.9)
MCV: 86.2 fL (ref 81.6–98.0)
RBC: 3.76 10*6/uL — ABNORMAL LOW (ref 4.20–5.71)
RDW: 17.4 % — ABNORMAL HIGH (ref 11.2–14.6)

## 2008-03-18 LAB — CBC WITH DIFFERENTIAL/PLATELET
BASO%: 0 % (ref 0.0–2.0)
Basophils Absolute: 0 10*3/uL (ref 0.0–0.1)
EOS%: 0.9 % (ref 0.0–7.0)
HCT: 31.3 % — ABNORMAL LOW (ref 38.7–49.9)
HGB: 11 g/dL — ABNORMAL LOW (ref 13.0–17.1)
LYMPH%: 11.3 % — ABNORMAL LOW (ref 14.0–48.0)
MCH: 30.2 pg (ref 28.0–33.4)
MCHC: 35.2 g/dL (ref 32.0–35.9)
MONO#: 0.6 10*3/uL (ref 0.1–0.9)
NEUT%: 79.1 % — ABNORMAL HIGH (ref 40.0–75.0)
Platelets: 135 10*3/uL — ABNORMAL LOW (ref 145–400)

## 2008-03-19 LAB — KAPPA/LAMBDA LIGHT CHAINS
Kappa free light chain: 2.74 mg/dL — ABNORMAL HIGH (ref 0.33–1.94)
Kappa:Lambda Ratio: 1.09 (ref 0.26–1.65)
Lambda Free Lght Chn: 2.52 mg/dL (ref 0.57–2.63)

## 2008-03-19 LAB — IGG: IgG (Immunoglobin G), Serum: 763 mg/dL (ref 694–1618)

## 2008-04-08 LAB — CBC WITH DIFFERENTIAL/PLATELET
BASO%: 0.8 % (ref 0.0–2.0)
EOS%: 1 % (ref 0.0–7.0)
HGB: 10.8 g/dL — ABNORMAL LOW (ref 13.0–17.1)
LYMPH%: 9.5 % — ABNORMAL LOW (ref 14.0–48.0)
MCV: 86.6 fL (ref 81.6–98.0)
NEUT#: 4.1 10*3/uL (ref 1.5–6.5)
NEUT%: 80.3 % — ABNORMAL HIGH (ref 40.0–75.0)
RDW: 15.5 % — ABNORMAL HIGH (ref 11.2–14.6)
WBC: 5.1 10*3/uL (ref 4.0–10.0)

## 2008-04-08 LAB — COMPREHENSIVE METABOLIC PANEL
Albumin: 3.3 g/dL — ABNORMAL LOW (ref 3.5–5.2)
Alkaline Phosphatase: 60 U/L (ref 39–117)
BUN: 40 mg/dL — ABNORMAL HIGH (ref 6–23)
Glucose, Bld: 218 mg/dL — ABNORMAL HIGH (ref 70–99)
Potassium: 4.1 mEq/L (ref 3.5–5.3)

## 2008-04-08 LAB — PROTIME-INR: Protime: 25.2 Seconds — ABNORMAL HIGH (ref 10.6–13.4)

## 2008-04-25 ENCOUNTER — Ambulatory Visit: Payer: Self-pay | Admitting: Oncology

## 2008-04-29 LAB — CBC WITH DIFFERENTIAL/PLATELET
BASO%: 0.4 % (ref 0.0–2.0)
Basophils Absolute: 0 10*3/uL (ref 0.0–0.1)
EOS%: 0.1 % (ref 0.0–7.0)
HCT: 31.6 % — ABNORMAL LOW (ref 38.7–49.9)
HGB: 11 g/dL — ABNORMAL LOW (ref 13.0–17.1)
MCH: 30.2 pg (ref 28.0–33.4)
MCHC: 34.9 g/dL (ref 32.0–35.9)
MCV: 86.5 fL (ref 81.6–98.0)
MONO%: 11.6 % (ref 0.0–13.0)
NEUT%: 79.3 % — ABNORMAL HIGH (ref 40.0–75.0)

## 2008-05-08 ENCOUNTER — Encounter: Admission: RE | Admit: 2008-05-08 | Discharge: 2008-05-08 | Payer: Self-pay | Admitting: Neurosurgery

## 2008-05-13 ENCOUNTER — Ambulatory Visit (HOSPITAL_COMMUNITY): Admission: RE | Admit: 2008-05-13 | Discharge: 2008-05-13 | Payer: Self-pay | Admitting: Oncology

## 2008-05-13 LAB — BASIC METABOLIC PANEL
Calcium: 8.9 mg/dL (ref 8.4–10.5)
Creatinine, Ser: 2.56 mg/dL — ABNORMAL HIGH (ref 0.40–1.50)

## 2008-05-13 LAB — CBC WITH DIFFERENTIAL/PLATELET
BASO%: 1.1 % (ref 0.0–2.0)
EOS%: 0.3 % (ref 0.0–7.0)
MCH: 30.3 pg (ref 28.0–33.4)
MCHC: 35.2 g/dL (ref 32.0–35.9)
RBC: 3.6 10*6/uL — ABNORMAL LOW (ref 4.20–5.71)
RDW: 15.1 % — ABNORMAL HIGH (ref 11.2–14.6)
lymph#: 0.7 10*3/uL — ABNORMAL LOW (ref 0.9–3.3)

## 2008-05-13 LAB — PROTIME-INR: INR: 1.8 — ABNORMAL LOW (ref 2.00–3.50)

## 2008-05-14 LAB — KAPPA/LAMBDA LIGHT CHAINS
Kappa free light chain: 2.04 mg/dL — ABNORMAL HIGH (ref 0.33–1.94)
Lambda Free Lght Chn: 2.43 mg/dL (ref 0.57–2.63)

## 2008-05-19 LAB — UIFE/LIGHT CHAINS/TP QN, 24-HR UR
Albumin, U: DETECTED
Alpha 1, Urine: DETECTED — AB
Beta, Urine: DETECTED — AB
Free Lambda Excretion/Day: 35 mg/d
Gamma Globulin, Urine: DETECTED — AB

## 2008-05-19 LAB — CREATININE CLEARANCE, URINE, 24 HOUR
Creatinine, 24H Ur: 2098 mg/d — ABNORMAL HIGH (ref 800–2000)
Creatinine, Urine: 83.9 mg/dL
Creatinine: 2.56 mg/dL — ABNORMAL HIGH (ref 0.40–1.50)
Urine Total Volume-CRCL: 2500 mL

## 2008-06-10 ENCOUNTER — Ambulatory Visit: Payer: Self-pay | Admitting: Oncology

## 2008-06-10 LAB — BASIC METABOLIC PANEL
BUN: 30 mg/dL — ABNORMAL HIGH (ref 6–23)
CO2: 21 mEq/L (ref 19–32)
Chloride: 106 mEq/L (ref 96–112)
Creatinine, Ser: 1.77 mg/dL — ABNORMAL HIGH (ref 0.40–1.50)
Glucose, Bld: 257 mg/dL — ABNORMAL HIGH (ref 70–99)
Potassium: 4.1 mEq/L (ref 3.5–5.3)

## 2008-06-24 LAB — MORPHOLOGY: PLT EST: ADEQUATE

## 2008-06-24 LAB — COMPREHENSIVE METABOLIC PANEL
ALT: 48 U/L (ref 0–53)
CO2: 22 mEq/L (ref 19–32)
Calcium: 8.6 mg/dL (ref 8.4–10.5)
Chloride: 100 mEq/L (ref 96–112)
Creatinine, Ser: 2.16 mg/dL — ABNORMAL HIGH (ref 0.40–1.50)
Glucose, Bld: 267 mg/dL — ABNORMAL HIGH (ref 70–99)
Total Bilirubin: 0.4 mg/dL (ref 0.3–1.2)

## 2008-06-24 LAB — CBC WITH DIFFERENTIAL/PLATELET
BASO%: 0.3 % (ref 0.0–2.0)
EOS%: 2.5 % (ref 0.0–7.0)
LYMPH%: 12.8 % — ABNORMAL LOW (ref 14.0–48.0)
MCH: 30.6 pg (ref 28.0–33.4)
MCHC: 34.4 g/dL (ref 32.0–35.9)
MCV: 89 fL (ref 81.6–98.0)
MONO%: 10.7 % (ref 0.0–13.0)
NEUT#: 3.7 10*3/uL (ref 1.5–6.5)
Platelets: 167 10*3/uL (ref 145–400)
RBC: 3.56 10*6/uL — ABNORMAL LOW (ref 4.20–5.71)
RDW: 18.9 % — ABNORMAL HIGH (ref 11.2–14.6)

## 2008-06-24 LAB — LACTATE DEHYDROGENASE: LDH: 94 U/L (ref 94–250)

## 2008-07-28 ENCOUNTER — Ambulatory Visit: Payer: Self-pay | Admitting: Oncology

## 2008-07-30 LAB — CBC WITH DIFFERENTIAL/PLATELET
BASO%: 0.3 % (ref 0.0–2.0)
EOS%: 1.3 % (ref 0.0–7.0)
LYMPH%: 12.8 % — ABNORMAL LOW (ref 14.0–48.0)
MCH: 30.9 pg (ref 28.0–33.4)
MCHC: 34.6 g/dL (ref 32.0–35.9)
MONO#: 0.6 10*3/uL (ref 0.1–0.9)
NEUT%: 76.3 % — ABNORMAL HIGH (ref 40.0–75.0)
Platelets: 177 10*3/uL (ref 145–400)
RBC: 3.68 10*6/uL — ABNORMAL LOW (ref 4.20–5.71)
WBC: 6.1 10*3/uL (ref 4.0–10.0)
lymph#: 0.8 10*3/uL — ABNORMAL LOW (ref 0.9–3.3)

## 2008-07-30 LAB — MORPHOLOGY

## 2008-07-31 LAB — COMPREHENSIVE METABOLIC PANEL

## 2008-07-31 LAB — LACTATE DEHYDROGENASE: LDH: 95 U/L (ref 94–250)

## 2008-07-31 LAB — KAPPA/LAMBDA LIGHT CHAINS
Kappa free light chain: 2.39 mg/dL — ABNORMAL HIGH (ref 0.33–1.94)
Kappa:Lambda Ratio: 0.82 (ref 0.26–1.65)
Lambda Free Lght Chn: 2.91 mg/dL — ABNORMAL HIGH (ref 0.57–2.63)

## 2008-08-07 LAB — UIFE/LIGHT CHAINS/TP QN, 24-HR UR
Alpha 2, Urine: DETECTED — AB
Free Kappa Lt Chains,Ur: 13.4 mg/dL — ABNORMAL HIGH (ref 0.04–1.51)
Free Kappa/Lambda Ratio: 5.73 ratio — ABNORMAL HIGH (ref 0.46–4.00)
Free Lt Chn Excr Rate: 388.6 mg/d
Gamma Globulin, Urine: DETECTED — AB
Total Protein, Urine: 50 mg/dL

## 2008-09-02 LAB — BASIC METABOLIC PANEL
CO2: 27 mEq/L (ref 19–32)
Chloride: 99 mEq/L (ref 96–112)
Creatinine, Ser: 1.98 mg/dL — ABNORMAL HIGH (ref 0.40–1.50)
Glucose, Bld: 199 mg/dL — ABNORMAL HIGH (ref 70–99)

## 2008-09-26 ENCOUNTER — Ambulatory Visit: Payer: Self-pay | Admitting: Oncology

## 2008-09-30 ENCOUNTER — Ambulatory Visit (HOSPITAL_COMMUNITY): Admission: RE | Admit: 2008-09-30 | Discharge: 2008-09-30 | Payer: Self-pay | Admitting: Oncology

## 2008-09-30 LAB — CBC WITH DIFFERENTIAL/PLATELET
BASO%: 0.2 % (ref 0.0–2.0)
Basophils Absolute: 0 10*3/uL (ref 0.0–0.1)
Eosinophils Absolute: 0.1 10*3/uL (ref 0.0–0.5)
HCT: 34.9 % — ABNORMAL LOW (ref 38.7–49.9)
HGB: 12.1 g/dL — ABNORMAL LOW (ref 13.0–17.1)
MCHC: 34.7 g/dL (ref 32.0–35.9)
MONO#: 0.6 10*3/uL (ref 0.1–0.9)
NEUT#: 5.3 10*3/uL (ref 1.5–6.5)
NEUT%: 76.6 % — ABNORMAL HIGH (ref 40.0–75.0)
Platelets: 174 10*3/uL (ref 145–400)
WBC: 6.9 10*3/uL (ref 4.0–10.0)
lymph#: 0.9 10*3/uL (ref 0.9–3.3)

## 2008-09-30 LAB — BASIC METABOLIC PANEL
CO2: 27 mEq/L (ref 19–32)
Chloride: 102 mEq/L (ref 96–112)
Creatinine, Ser: 1.99 mg/dL — ABNORMAL HIGH (ref 0.40–1.50)
Glucose, Bld: 202 mg/dL — ABNORMAL HIGH (ref 70–99)

## 2008-10-01 LAB — KAPPA/LAMBDA LIGHT CHAINS: Kappa:Lambda Ratio: 1.52 (ref 0.26–1.65)

## 2008-10-29 LAB — BASIC METABOLIC PANEL
Calcium: 9 mg/dL (ref 8.4–10.5)
Potassium: 4.1 mEq/L (ref 3.5–5.3)
Sodium: 138 mEq/L (ref 135–145)

## 2008-11-14 ENCOUNTER — Ambulatory Visit: Payer: Self-pay | Admitting: Oncology

## 2008-11-18 ENCOUNTER — Ambulatory Visit (HOSPITAL_COMMUNITY): Admission: RE | Admit: 2008-11-18 | Discharge: 2008-11-18 | Payer: Self-pay | Admitting: Oncology

## 2008-11-18 LAB — CBC WITH DIFFERENTIAL/PLATELET
BASO%: 0.3 % (ref 0.0–2.0)
Eosinophils Absolute: 0.1 10*3/uL (ref 0.0–0.5)
MCHC: 34.4 g/dL (ref 32.0–35.9)
MONO#: 0.5 10*3/uL (ref 0.1–0.9)
MONO%: 8 % (ref 0.0–13.0)
NEUT#: 5 10*3/uL (ref 1.5–6.5)
RBC: 4.25 10*6/uL (ref 4.20–5.71)
RDW: 15.1 % — ABNORMAL HIGH (ref 11.2–14.6)
WBC: 6.4 10*3/uL (ref 4.0–10.0)

## 2008-11-20 LAB — COMPREHENSIVE METABOLIC PANEL
Albumin: 3.9 g/dL (ref 3.5–5.2)
CO2: 25 mEq/L (ref 19–32)
Chloride: 101 mEq/L (ref 96–112)
Glucose, Bld: 160 mg/dL — ABNORMAL HIGH (ref 70–99)
Potassium: 3.9 mEq/L (ref 3.5–5.3)
Sodium: 138 mEq/L (ref 135–145)
Total Protein: 6.9 g/dL (ref 6.0–8.3)

## 2008-11-20 LAB — IMMUNOFIXATION ELECTROPHORESIS: Total Protein, Serum Electrophoresis: 6.9 g/dL (ref 6.0–8.3)

## 2008-11-20 LAB — KAPPA/LAMBDA LIGHT CHAINS
Kappa:Lambda Ratio: 1.43 (ref 0.26–1.65)
Lambda Free Lght Chn: 2.44 mg/dL (ref 0.57–2.63)

## 2008-11-20 LAB — LACTATE DEHYDROGENASE: LDH: 88 U/L — ABNORMAL LOW (ref 94–250)

## 2008-11-24 LAB — UIFE/LIGHT CHAINS/TP QN, 24-HR UR
Albumin, U: DETECTED
Alpha 1, Urine: DETECTED — AB
Free Kappa/Lambda Ratio: 8.31 ratio — ABNORMAL HIGH (ref 0.46–4.00)
Free Lambda Excretion/Day: 23.75 mg/d
Free Lambda Lt Chains,Ur: 0.95 mg/dL (ref 0.08–1.01)
Time: 24 hours
Total Protein, Urine-Ur/day: 1018 mg/d — ABNORMAL HIGH (ref 10–140)
Volume, Urine: 2500 mL

## 2008-12-24 LAB — BASIC METABOLIC PANEL
BUN: 29 mg/dL — ABNORMAL HIGH (ref 6–23)
Creatinine, Ser: 1.85 mg/dL — ABNORMAL HIGH (ref 0.40–1.50)
Glucose, Bld: 294 mg/dL — ABNORMAL HIGH (ref 70–99)

## 2009-01-19 ENCOUNTER — Ambulatory Visit: Payer: Self-pay | Admitting: Oncology

## 2009-01-21 LAB — BASIC METABOLIC PANEL
BUN: 29 mg/dL — ABNORMAL HIGH (ref 6–23)
CO2: 22 mEq/L (ref 19–32)
Chloride: 101 mEq/L (ref 96–112)
Creatinine, Ser: 1.97 mg/dL — ABNORMAL HIGH (ref 0.40–1.50)
Glucose, Bld: 234 mg/dL — ABNORMAL HIGH (ref 70–99)
Potassium: 3.9 mEq/L (ref 3.5–5.3)

## 2009-02-17 LAB — CBC WITH DIFFERENTIAL/PLATELET
BASO%: 0.4 % (ref 0.0–2.0)
Basophils Absolute: 0 10*3/uL (ref 0.0–0.1)
EOS%: 1.7 % (ref 0.0–7.0)
HCT: 37.4 % — ABNORMAL LOW (ref 38.4–49.9)
HGB: 12.7 g/dL — ABNORMAL LOW (ref 13.0–17.1)
MONO#: 0.5 10*3/uL (ref 0.1–0.9)
NEUT#: 4.7 10*3/uL (ref 1.5–6.5)
NEUT%: 76.6 % — ABNORMAL HIGH (ref 39.0–75.0)
RDW: 15.2 % — ABNORMAL HIGH (ref 11.0–14.6)
WBC: 6.2 10*3/uL (ref 4.0–10.3)
lymph#: 0.8 10*3/uL — ABNORMAL LOW (ref 0.9–3.3)

## 2009-02-17 LAB — MORPHOLOGY: PLT EST: ADEQUATE

## 2009-02-19 LAB — COMPREHENSIVE METABOLIC PANEL
ALT: 48 U/L (ref 0–53)
AST: 31 U/L (ref 0–37)
Alkaline Phosphatase: 57 U/L (ref 39–117)
BUN: 37 mg/dL — ABNORMAL HIGH (ref 6–23)
Calcium: 9 mg/dL (ref 8.4–10.5)
Chloride: 100 mEq/L (ref 96–112)
Creatinine, Ser: 2.13 mg/dL — ABNORMAL HIGH (ref 0.40–1.50)
Potassium: 4.2 mEq/L (ref 3.5–5.3)

## 2009-02-19 LAB — IMMUNOFIXATION ELECTROPHORESIS: IgM, Serum: 60 mg/dL (ref 60–263)

## 2009-02-19 LAB — KAPPA/LAMBDA LIGHT CHAINS
Kappa free light chain: 4.16 mg/dL — ABNORMAL HIGH (ref 0.33–1.94)
Kappa:Lambda Ratio: 1.36 (ref 0.26–1.65)
Lambda Free Lght Chn: 3.06 mg/dL — ABNORMAL HIGH (ref 0.57–2.63)

## 2009-02-24 LAB — UIFE/LIGHT CHAINS/TP QN, 24-HR UR
Alpha 2, Urine: DETECTED — AB
Beta, Urine: DETECTED — AB
Free Kappa Lt Chains,Ur: 10.3 mg/dL — ABNORMAL HIGH (ref 0.04–1.51)
Gamma Globulin, Urine: DETECTED — AB
Total Protein, Urine: 34.6 mg/dL

## 2009-03-16 ENCOUNTER — Ambulatory Visit: Payer: Self-pay | Admitting: Oncology

## 2009-03-18 LAB — BASIC METABOLIC PANEL
Calcium: 8.9 mg/dL (ref 8.4–10.5)
Creatinine, Ser: 2.4 mg/dL — ABNORMAL HIGH (ref 0.40–1.50)
Sodium: 138 mEq/L (ref 135–145)

## 2009-04-15 LAB — BASIC METABOLIC PANEL
CO2: 23 mEq/L (ref 19–32)
Calcium: 8.6 mg/dL (ref 8.4–10.5)
Creatinine, Ser: 1.96 mg/dL — ABNORMAL HIGH (ref 0.40–1.50)
Glucose, Bld: 198 mg/dL — ABNORMAL HIGH (ref 70–99)

## 2009-05-15 ENCOUNTER — Ambulatory Visit: Payer: Self-pay | Admitting: Oncology

## 2009-05-19 ENCOUNTER — Ambulatory Visit (HOSPITAL_COMMUNITY): Admission: RE | Admit: 2009-05-19 | Discharge: 2009-05-19 | Payer: Self-pay | Admitting: Oncology

## 2009-05-19 LAB — MORPHOLOGY

## 2009-05-19 LAB — CBC WITH DIFFERENTIAL/PLATELET
BASO%: 0.3 % (ref 0.0–2.0)
Basophils Absolute: 0 10*3/uL (ref 0.0–0.1)
EOS%: 1.8 % (ref 0.0–7.0)
HCT: 37.2 % — ABNORMAL LOW (ref 38.4–49.9)
HGB: 12.8 g/dL — ABNORMAL LOW (ref 13.0–17.1)
LYMPH%: 16.5 % (ref 14.0–49.0)
MCH: 28.5 pg (ref 27.2–33.4)
MCHC: 34.4 g/dL (ref 32.0–36.0)
MCV: 82.9 fL (ref 79.3–98.0)
MONO%: 9.1 % (ref 0.0–14.0)
NEUT%: 72.3 % (ref 39.0–75.0)
Platelets: 127 10*3/uL — ABNORMAL LOW (ref 140–400)

## 2009-05-21 LAB — IMMUNOFIXATION ELECTROPHORESIS
IgA: 458 mg/dL — ABNORMAL HIGH (ref 68–378)
IgM, Serum: 60 mg/dL (ref 60–263)
Total Protein, Serum Electrophoresis: 7.1 g/dL (ref 6.0–8.3)

## 2009-05-21 LAB — COMPREHENSIVE METABOLIC PANEL
Alkaline Phosphatase: 50 U/L (ref 39–117)
CO2: 22 mEq/L (ref 19–32)
Creatinine, Ser: 1.83 mg/dL — ABNORMAL HIGH (ref 0.40–1.50)
Glucose, Bld: 155 mg/dL — ABNORMAL HIGH (ref 70–99)
Sodium: 138 mEq/L (ref 135–145)
Total Bilirubin: 0.4 mg/dL (ref 0.3–1.2)
Total Protein: 7.1 g/dL (ref 6.0–8.3)

## 2009-05-21 LAB — LACTATE DEHYDROGENASE: LDH: 90 U/L — ABNORMAL LOW (ref 94–250)

## 2009-05-21 LAB — KAPPA/LAMBDA LIGHT CHAINS: Kappa:Lambda Ratio: 1.02 (ref 0.26–1.65)

## 2009-05-25 LAB — UIFE/LIGHT CHAINS/TP QN, 24-HR UR
Albumin, U: DETECTED
Beta, Urine: DETECTED — AB
Free Lambda Excretion/Day: 40.89 mg/d
Free Lambda Lt Chains,Ur: 1.74 mg/dL — ABNORMAL HIGH (ref 0.08–1.01)
Free Lt Chn Excr Rate: 223.25 mg/d
Time: 24 hours
Total Protein, Urine-Ur/day: 952 mg/d — ABNORMAL HIGH (ref 10–140)
Total Protein, Urine: 40.5 mg/dL
Volume, Urine: 2350 mL

## 2009-06-10 LAB — BASIC METABOLIC PANEL
BUN: 41 mg/dL — ABNORMAL HIGH (ref 6–23)
CO2: 21 mEq/L (ref 19–32)
Calcium: 8.6 mg/dL (ref 8.4–10.5)
Glucose, Bld: 300 mg/dL — ABNORMAL HIGH (ref 70–99)
Potassium: 4.1 mEq/L (ref 3.5–5.3)

## 2009-08-10 ENCOUNTER — Ambulatory Visit: Payer: Self-pay | Admitting: Oncology

## 2009-08-12 LAB — CBC WITH DIFFERENTIAL/PLATELET
Basophils Absolute: 0 10*3/uL (ref 0.0–0.1)
EOS%: 1.6 % (ref 0.0–7.0)
HGB: 13.1 g/dL (ref 13.0–17.1)
LYMPH%: 19.7 % (ref 14.0–49.0)
MCH: 28.7 pg (ref 27.2–33.4)
MCV: 84.9 fL (ref 79.3–98.0)
MONO%: 9 % (ref 0.0–14.0)
NEUT%: 69.3 % (ref 39.0–75.0)
Platelets: 128 10*3/uL — ABNORMAL LOW (ref 140–400)
RDW: 14.8 % — ABNORMAL HIGH (ref 11.0–14.6)

## 2009-08-12 LAB — COMPREHENSIVE METABOLIC PANEL
Alkaline Phosphatase: 53 U/L (ref 39–117)
BUN: 38 mg/dL — ABNORMAL HIGH (ref 6–23)
Creatinine, Ser: 1.88 mg/dL — ABNORMAL HIGH (ref 0.40–1.50)
Glucose, Bld: 101 mg/dL — ABNORMAL HIGH (ref 70–99)
Sodium: 141 mEq/L (ref 135–145)
Total Bilirubin: 0.6 mg/dL (ref 0.3–1.2)

## 2009-08-12 LAB — LACTATE DEHYDROGENASE: LDH: 85 U/L — ABNORMAL LOW (ref 94–250)

## 2009-08-13 LAB — KAPPA/LAMBDA LIGHT CHAINS
Kappa free light chain: 2.82 mg/dL — ABNORMAL HIGH (ref 0.33–1.94)
Kappa:Lambda Ratio: 1.23 (ref 0.26–1.65)
Lambda Free Lght Chn: 2.3 mg/dL (ref 0.57–2.63)

## 2009-09-25 ENCOUNTER — Ambulatory Visit: Payer: Self-pay | Admitting: Oncology

## 2009-09-30 LAB — CBC WITH DIFFERENTIAL/PLATELET
Basophils Absolute: 0 10*3/uL (ref 0.0–0.1)
EOS%: 1.7 % (ref 0.0–7.0)
HCT: 39.1 % (ref 38.4–49.9)
HGB: 13.1 g/dL (ref 13.0–17.1)
MCH: 29.2 pg (ref 27.2–33.4)
MCV: 87 fL (ref 79.3–98.0)
MONO%: 9.7 % (ref 0.0–14.0)
NEUT%: 71.6 % (ref 39.0–75.0)
lymph#: 1.1 10*3/uL (ref 0.9–3.3)

## 2009-09-30 LAB — BASIC METABOLIC PANEL
BUN: 30 mg/dL — ABNORMAL HIGH (ref 6–23)
Chloride: 107 mEq/L (ref 96–112)
Creatinine, Ser: 1.7 mg/dL — ABNORMAL HIGH (ref 0.40–1.50)

## 2009-10-02 LAB — KAPPA/LAMBDA LIGHT CHAINS: Lambda Free Lght Chn: 2.41 mg/dL (ref 0.57–2.63)

## 2009-12-17 ENCOUNTER — Ambulatory Visit: Payer: Self-pay | Admitting: Oncology

## 2009-12-21 LAB — CBC WITH DIFFERENTIAL/PLATELET
Basophils Absolute: 0 10*3/uL (ref 0.0–0.1)
Eosinophils Absolute: 0.1 10*3/uL (ref 0.0–0.5)
HCT: 36.5 % — ABNORMAL LOW (ref 38.4–49.9)
HGB: 12.4 g/dL — ABNORMAL LOW (ref 13.0–17.1)
MONO#: 0.5 10*3/uL (ref 0.1–0.9)
NEUT#: 3.4 10*3/uL (ref 1.5–6.5)
NEUT%: 66.5 % (ref 39.0–75.0)
WBC: 5.1 10*3/uL (ref 4.0–10.3)
lymph#: 1.1 10*3/uL (ref 0.9–3.3)

## 2009-12-21 LAB — MORPHOLOGY: PLT EST: DECREASED

## 2009-12-23 LAB — COMPREHENSIVE METABOLIC PANEL
ALT: 51 U/L (ref 0–53)
Albumin: 3.8 g/dL (ref 3.5–5.2)
Alkaline Phosphatase: 53 U/L (ref 39–117)
Glucose, Bld: 92 mg/dL (ref 70–99)
Potassium: 3.7 mEq/L (ref 3.5–5.3)
Sodium: 140 mEq/L (ref 135–145)
Total Bilirubin: 0.3 mg/dL (ref 0.3–1.2)
Total Protein: 6.8 g/dL (ref 6.0–8.3)

## 2009-12-23 LAB — KAPPA/LAMBDA LIGHT CHAINS: Kappa free light chain: 5.48 mg/dL — ABNORMAL HIGH (ref 0.33–1.94)

## 2009-12-28 LAB — UIFE/LIGHT CHAINS/TP QN, 24-HR UR
Albumin, U: DETECTED
Alpha 1, Urine: DETECTED — AB
Free Lambda Lt Chains,Ur: 2.96 mg/dL — ABNORMAL HIGH (ref 0.08–1.01)
Gamma Globulin, Urine: DETECTED — AB
Total Protein, Urine: 130.3 mg/dL

## 2010-01-13 ENCOUNTER — Ambulatory Visit (HOSPITAL_COMMUNITY): Admission: RE | Admit: 2010-01-13 | Discharge: 2010-01-13 | Payer: Self-pay | Admitting: Oncology

## 2010-01-13 LAB — BASIC METABOLIC PANEL
CO2: 25 mEq/L (ref 19–32)
Calcium: 9.1 mg/dL (ref 8.4–10.5)
Sodium: 141 mEq/L (ref 135–145)

## 2010-02-18 ENCOUNTER — Ambulatory Visit: Payer: Self-pay | Admitting: Oncology

## 2010-02-23 LAB — UIFE/LIGHT CHAINS/TP QN, 24-HR UR
Alpha 2, Urine: DETECTED — AB
Beta, Urine: DETECTED — AB
Free Lt Chn Excr Rate: 372.5 mg/d
Total Protein, Urine-Ur/day: 3123 mg/d — ABNORMAL HIGH (ref 10–140)

## 2010-02-23 LAB — KAPPA/LAMBDA LIGHT CHAINS
Kappa free light chain: 4.36 mg/dL — ABNORMAL HIGH (ref 0.33–1.94)
Kappa:Lambda Ratio: 1.43 (ref 0.26–1.65)
Lambda Free Lght Chn: 3.04 mg/dL — ABNORMAL HIGH (ref 0.57–2.63)

## 2010-04-19 ENCOUNTER — Ambulatory Visit: Payer: Self-pay | Admitting: Oncology

## 2010-04-21 LAB — CBC WITH DIFFERENTIAL/PLATELET
BASO%: 0.3 % (ref 0.0–2.0)
MCHC: 34 g/dL (ref 32.0–36.0)
MONO#: 0.6 10*3/uL (ref 0.1–0.9)
RBC: 4.58 10*6/uL (ref 4.20–5.82)
RDW: 15.8 % — ABNORMAL HIGH (ref 11.0–14.6)
WBC: 6.5 10*3/uL (ref 4.0–10.3)
lymph#: 1 10*3/uL (ref 0.9–3.3)

## 2010-04-21 LAB — MORPHOLOGY: PLT EST: ADEQUATE

## 2010-04-26 LAB — COMPREHENSIVE METABOLIC PANEL
ALT: 44 U/L (ref 0–53)
Alkaline Phosphatase: 55 U/L (ref 39–117)
Glucose, Bld: 112 mg/dL — ABNORMAL HIGH (ref 70–99)
Sodium: 141 mEq/L (ref 135–145)
Total Bilirubin: 0.3 mg/dL (ref 0.3–1.2)
Total Protein: 6.7 g/dL (ref 6.0–8.3)

## 2010-04-26 LAB — IMMUNOFIXATION ELECTROPHORESIS
IgM, Serum: 67 mg/dL (ref 60–263)
Total Protein, Serum Electrophoresis: 6.7 g/dL (ref 6.0–8.3)

## 2010-04-26 LAB — KAPPA/LAMBDA LIGHT CHAINS: Kappa:Lambda Ratio: 1.8 — ABNORMAL HIGH (ref 0.26–1.65)

## 2010-06-10 ENCOUNTER — Ambulatory Visit: Payer: Self-pay | Admitting: Oncology

## 2010-06-14 LAB — CBC WITH DIFFERENTIAL/PLATELET
Basophils Absolute: 0 10*3/uL (ref 0.0–0.1)
Eosinophils Absolute: 0.1 10*3/uL (ref 0.0–0.5)
HCT: 40.9 % (ref 38.4–49.9)
HGB: 14.1 g/dL (ref 13.0–17.1)
MCH: 29.9 pg (ref 27.2–33.4)
MONO#: 0.6 10*3/uL (ref 0.1–0.9)
NEUT#: 5.1 10*3/uL (ref 1.5–6.5)
NEUT%: 72.3 % (ref 39.0–75.0)
RDW: 14.7 % — ABNORMAL HIGH (ref 11.0–14.6)
lymph#: 1.2 10*3/uL (ref 0.9–3.3)

## 2010-06-14 LAB — MORPHOLOGY: PLT EST: ADEQUATE

## 2010-06-15 LAB — COMPREHENSIVE METABOLIC PANEL
Alkaline Phosphatase: 60 U/L (ref 39–117)
BUN: 43 mg/dL — ABNORMAL HIGH (ref 6–23)
Glucose, Bld: 138 mg/dL — ABNORMAL HIGH (ref 70–99)
Potassium: 4.2 mEq/L (ref 3.5–5.3)
Sodium: 139 mEq/L (ref 135–145)
Total Bilirubin: 0.3 mg/dL (ref 0.3–1.2)

## 2010-06-15 LAB — KAPPA/LAMBDA LIGHT CHAINS
Kappa:Lambda Ratio: 1.96 — ABNORMAL HIGH (ref 0.26–1.65)
Lambda Free Lght Chn: 2.82 mg/dL — ABNORMAL HIGH (ref 0.57–2.63)

## 2010-07-13 ENCOUNTER — Ambulatory Visit: Payer: Self-pay | Admitting: Oncology

## 2010-07-13 LAB — BASIC METABOLIC PANEL
CO2: 27 mEq/L (ref 19–32)
Calcium: 9.2 mg/dL (ref 8.4–10.5)
Chloride: 102 mEq/L (ref 96–112)
Creatinine, Ser: 1.65 mg/dL — ABNORMAL HIGH (ref 0.40–1.50)
Sodium: 140 mEq/L (ref 135–145)

## 2010-08-20 ENCOUNTER — Ambulatory Visit: Payer: Self-pay | Admitting: Oncology

## 2010-08-23 LAB — CBC WITH DIFFERENTIAL/PLATELET
EOS%: 1.7 % (ref 0.0–7.0)
HCT: 37 % — ABNORMAL LOW (ref 38.4–49.9)
MCHC: 34.5 g/dL (ref 32.0–36.0)
MCV: 87.8 fL (ref 79.3–98.0)
MONO#: 0.7 10*3/uL (ref 0.1–0.9)
MONO%: 8.8 % (ref 0.0–14.0)
NEUT#: 6.2 10*3/uL (ref 1.5–6.5)
Platelets: 155 10*3/uL (ref 140–400)
RDW: 15.1 % — ABNORMAL HIGH (ref 11.0–14.6)

## 2010-08-25 LAB — UIFE/LIGHT CHAINS/TP QN, 24-HR UR
Albumin, U: DETECTED
Free Kappa Lt Chains,Ur: 37 mg/dL — ABNORMAL HIGH (ref 0.04–1.51)
Free Lambda Excretion/Day: 68.8 mg/d
Free Lambda Lt Chains,Ur: 3.44 mg/dL — ABNORMAL HIGH (ref 0.08–1.01)
Free Lt Chn Excr Rate: 740 mg/d
Total Protein, Urine-Ur/day: 4768 mg/d — ABNORMAL HIGH (ref 10–140)
Total Protein, Urine: 238.4 mg/dL

## 2010-08-25 LAB — CREATININE CLEARANCE, URINE, 24 HOUR
Collection Interval-CRCL: 24 hours
Creatinine Clearance: 80 mL/min (ref 75–125)
Creatinine, 24H Ur: 2154 mg/d — ABNORMAL HIGH (ref 800–2000)
Creatinine, Urine: 107.7 mg/dL
Creatinine: 1.88 mg/dL — ABNORMAL HIGH (ref 0.40–1.50)
Urine Total Volume-CRCL: 2000 mL

## 2010-08-25 LAB — KAPPA/LAMBDA LIGHT CHAINS: Kappa free light chain: 5.3 mg/dL — ABNORMAL HIGH (ref 0.33–1.94)

## 2010-08-25 LAB — COMPREHENSIVE METABOLIC PANEL
AST: 23 U/L (ref 0–37)
Chloride: 104 mEq/L (ref 96–112)
Creatinine, Ser: 1.88 mg/dL — ABNORMAL HIGH (ref 0.40–1.50)
Potassium: 4.2 mEq/L (ref 3.5–5.3)
Total Bilirubin: 0.4 mg/dL (ref 0.3–1.2)
Total Protein: 6.5 g/dL (ref 6.0–8.3)

## 2010-08-25 LAB — PROTEIN ELECTROPHORESIS, SERUM: Albumin ELP: 49.5 % — ABNORMAL LOW (ref 55.8–66.1)

## 2010-08-25 LAB — LACTATE DEHYDROGENASE: LDH: 91 U/L — ABNORMAL LOW (ref 94–250)

## 2010-10-20 ENCOUNTER — Ambulatory Visit (HOSPITAL_BASED_OUTPATIENT_CLINIC_OR_DEPARTMENT_OTHER): Payer: Medicare Other | Admitting: Oncology

## 2010-10-22 LAB — CBC WITH DIFFERENTIAL/PLATELET
BASO%: 0.2 % (ref 0.0–2.0)
Eosinophils Absolute: 0.2 10*3/uL (ref 0.0–0.5)
LYMPH%: 11.5 % — ABNORMAL LOW (ref 14.0–49.0)
MCHC: 33.8 g/dL (ref 32.0–36.0)
MONO#: 0.7 10*3/uL (ref 0.1–0.9)
NEUT#: 5.8 10*3/uL (ref 1.5–6.5)
RBC: 4.15 10*6/uL — ABNORMAL LOW (ref 4.20–5.82)
RDW: 15 % — ABNORMAL HIGH (ref 11.0–14.6)
WBC: 7.6 10*3/uL (ref 4.0–10.3)
lymph#: 0.9 10*3/uL (ref 0.9–3.3)

## 2010-10-22 LAB — MORPHOLOGY

## 2010-10-25 LAB — BASIC METABOLIC PANEL
BUN: 26 mg/dL — ABNORMAL HIGH (ref 6–23)
Calcium: 8.7 mg/dL (ref 8.4–10.5)
Glucose, Bld: 196 mg/dL — ABNORMAL HIGH (ref 70–99)
Potassium: 4.1 mEq/L (ref 3.5–5.3)
Sodium: 139 mEq/L (ref 135–145)

## 2010-10-25 LAB — KAPPA/LAMBDA LIGHT CHAINS
Kappa free light chain: 11 mg/dL — ABNORMAL HIGH (ref 0.33–1.94)
Lambda Free Lght Chn: 3.18 mg/dL — ABNORMAL HIGH (ref 0.57–2.63)

## 2010-10-29 LAB — UIFE/LIGHT CHAINS/TP QN, 24-HR UR
Albumin, U: DETECTED
Alpha 1, Urine: DETECTED — AB
Beta, Urine: DETECTED — AB
Free Kappa Lt Chains,Ur: 122 mg/dL — ABNORMAL HIGH (ref 0.04–1.51)
Gamma Globulin, Urine: DETECTED — AB
Time: 24 hours
Volume, Urine: 1500 mL

## 2010-10-29 LAB — CREATININE CLEARANCE, URINE, 24 HOUR
Creatinine Clearance: 78 mL/min (ref 75–125)
Creatinine, 24H Ur: 2211 mg/d — ABNORMAL HIGH (ref 800–2000)
Creatinine, Urine: 147.4 mg/dL
Creatinine: 1.98 mg/dL — ABNORMAL HIGH (ref 0.40–1.50)
Urine Total Volume-CRCL: 1500 mL

## 2010-11-25 ENCOUNTER — Other Ambulatory Visit: Payer: Self-pay | Admitting: Oncology

## 2010-11-25 DIAGNOSIS — C9000 Multiple myeloma not having achieved remission: Secondary | ICD-10-CM

## 2010-11-28 ENCOUNTER — Encounter: Payer: Self-pay | Admitting: Oncology

## 2010-11-28 ENCOUNTER — Encounter: Payer: Self-pay | Admitting: Neurosurgery

## 2010-11-28 ENCOUNTER — Encounter: Payer: Self-pay | Admitting: Radiation Oncology

## 2010-12-09 ENCOUNTER — Other Ambulatory Visit (HOSPITAL_COMMUNITY)
Admission: RE | Admit: 2010-12-09 | Discharge: 2010-12-09 | Disposition: A | Discharge status: Home / Self Care | Payer: Medicare Other | Source: Ambulatory Visit | Attending: Oncology | Admitting: Oncology

## 2010-12-09 ENCOUNTER — Ambulatory Visit (HOSPITAL_COMMUNITY)
Admission: RE | Admit: 2010-12-09 | Discharge: 2010-12-09 | Disposition: A | Discharge status: Home / Self Care | Payer: Medicare Other | Source: Ambulatory Visit | Attending: Oncology | Admitting: Oncology

## 2010-12-09 ENCOUNTER — Encounter (HOSPITAL_BASED_OUTPATIENT_CLINIC_OR_DEPARTMENT_OTHER): Payer: Medicare Other | Admitting: Oncology

## 2010-12-09 ENCOUNTER — Other Ambulatory Visit: Payer: Self-pay | Admitting: Oncology

## 2010-12-09 DIAGNOSIS — E118 Type 2 diabetes mellitus with unspecified complications: Secondary | ICD-10-CM

## 2010-12-09 DIAGNOSIS — C9000 Multiple myeloma not having achieved remission: Secondary | ICD-10-CM

## 2010-12-09 DIAGNOSIS — R809 Proteinuria, unspecified: Secondary | ICD-10-CM

## 2010-12-09 DIAGNOSIS — N049 Nephrotic syndrome with unspecified morphologic changes: Secondary | ICD-10-CM

## 2010-12-09 DIAGNOSIS — M899 Disorder of bone, unspecified: Secondary | ICD-10-CM | POA: Insufficient documentation

## 2010-12-09 LAB — CBC WITH DIFFERENTIAL/PLATELET
BASO%: 0 % (ref 0.0–2.0)
Basophils Absolute: 0 10*3/uL (ref 0.0–0.1)
EOS%: 2.5 % (ref 0.0–7.0)
HCT: 36.3 % — ABNORMAL LOW (ref 38.4–49.9)
LYMPH%: 17.9 % (ref 14.0–49.0)
MCH: 29.1 pg (ref 27.2–33.4)
MCHC: 33.7 g/dL (ref 32.0–36.0)
MCV: 86.3 fL (ref 79.3–98.0)
MONO#: 0.5 10*3/uL (ref 0.1–0.9)
NEUT#: 4.4 10*3/uL (ref 1.5–6.5)
Platelets: 163 10*3/uL (ref 140–400)
RBC: 4.21 10*6/uL (ref 4.20–5.82)
RDW: 15.5 % — ABNORMAL HIGH (ref 11.0–14.6)
lymph#: 1.1 10*3/uL (ref 0.9–3.3)

## 2010-12-09 LAB — MORPHOLOGY

## 2010-12-13 LAB — IMMUNOFIXATION ELECTROPHORESIS
IgG (Immunoglobin G), Serum: 1560 mg/dL (ref 694–1618)
IgM, Serum: 68 mg/dL (ref 60–263)
Total Protein, Serum Electrophoresis: 6.6 g/dL (ref 6.0–8.3)

## 2010-12-13 LAB — COMPREHENSIVE METABOLIC PANEL
BUN: 35 mg/dL — ABNORMAL HIGH (ref 6–23)
CO2: 25 mEq/L (ref 19–32)
Potassium: 4 mEq/L (ref 3.5–5.3)
Sodium: 142 mEq/L (ref 135–145)
Total Bilirubin: 0.3 mg/dL (ref 0.3–1.2)

## 2010-12-17 ENCOUNTER — Encounter (HOSPITAL_BASED_OUTPATIENT_CLINIC_OR_DEPARTMENT_OTHER): Payer: Medicare Other | Admitting: Oncology

## 2010-12-17 ENCOUNTER — Other Ambulatory Visit: Payer: Self-pay | Admitting: Oncology

## 2010-12-17 DIAGNOSIS — R809 Proteinuria, unspecified: Secondary | ICD-10-CM

## 2010-12-17 DIAGNOSIS — C9000 Multiple myeloma not having achieved remission: Secondary | ICD-10-CM

## 2010-12-17 DIAGNOSIS — N049 Nephrotic syndrome with unspecified morphologic changes: Secondary | ICD-10-CM

## 2010-12-23 ENCOUNTER — Ambulatory Visit (HOSPITAL_COMMUNITY)
Admission: RE | Admit: 2010-12-23 | Discharge: 2010-12-23 | Disposition: A | Discharge status: Home / Self Care | Payer: Medicare Other | Source: Ambulatory Visit | Attending: Oncology | Admitting: Oncology

## 2010-12-23 DIAGNOSIS — C9000 Multiple myeloma not having achieved remission: Secondary | ICD-10-CM | POA: Insufficient documentation

## 2010-12-23 DIAGNOSIS — M899 Disorder of bone, unspecified: Secondary | ICD-10-CM | POA: Insufficient documentation

## 2010-12-23 DIAGNOSIS — M949 Disorder of cartilage, unspecified: Secondary | ICD-10-CM | POA: Insufficient documentation

## 2011-01-03 ENCOUNTER — Ambulatory Visit: Payer: Medicare Other | Attending: Radiation Oncology | Admitting: Radiation Oncology

## 2011-01-03 DIAGNOSIS — C9000 Multiple myeloma not having achieved remission: Secondary | ICD-10-CM | POA: Insufficient documentation

## 2011-01-03 DIAGNOSIS — Z510 Encounter for antineoplastic radiation therapy: Secondary | ICD-10-CM | POA: Insufficient documentation

## 2011-01-20 ENCOUNTER — Encounter (HOSPITAL_BASED_OUTPATIENT_CLINIC_OR_DEPARTMENT_OTHER): Payer: Medicare Other | Admitting: Oncology

## 2011-01-20 ENCOUNTER — Other Ambulatory Visit: Payer: Self-pay | Admitting: Oncology

## 2011-01-20 DIAGNOSIS — C9000 Multiple myeloma not having achieved remission: Secondary | ICD-10-CM

## 2011-01-20 LAB — BASIC METABOLIC PANEL
BUN: 30 mg/dL — ABNORMAL HIGH (ref 6–23)
Calcium: 8.5 mg/dL (ref 8.4–10.5)
Creatinine, Ser: 1.7 mg/dL — ABNORMAL HIGH (ref 0.40–1.50)

## 2011-02-11 ENCOUNTER — Other Ambulatory Visit: Payer: Self-pay | Admitting: Oncology

## 2011-02-11 ENCOUNTER — Encounter (HOSPITAL_BASED_OUTPATIENT_CLINIC_OR_DEPARTMENT_OTHER): Payer: Medicare Other | Admitting: Oncology

## 2011-02-11 DIAGNOSIS — R809 Proteinuria, unspecified: Secondary | ICD-10-CM

## 2011-02-11 DIAGNOSIS — N049 Nephrotic syndrome with unspecified morphologic changes: Secondary | ICD-10-CM

## 2011-02-11 DIAGNOSIS — C9000 Multiple myeloma not having achieved remission: Secondary | ICD-10-CM

## 2011-02-11 DIAGNOSIS — N390 Urinary tract infection, site not specified: Secondary | ICD-10-CM

## 2011-02-11 LAB — CBC WITH DIFFERENTIAL/PLATELET
EOS%: 2.3 % (ref 0.0–7.0)
Eosinophils Absolute: 0.1 10*3/uL (ref 0.0–0.5)
LYMPH%: 14.2 % (ref 14.0–49.0)
MCH: 28.9 pg (ref 27.2–33.4)
MCV: 86.2 fL (ref 79.3–98.0)
MONO%: 8.3 % (ref 0.0–14.0)
NEUT#: 4.2 10*3/uL (ref 1.5–6.5)
Platelets: 142 10*3/uL (ref 140–400)
RBC: 4.34 10*6/uL (ref 4.20–5.82)
RDW: 15.7 % — ABNORMAL HIGH (ref 11.0–14.6)

## 2011-02-14 LAB — KAPPA/LAMBDA LIGHT CHAINS
Kappa free light chain: 11.2 mg/dL — ABNORMAL HIGH (ref 0.33–1.94)
Lambda Free Lght Chn: 3.53 mg/dL — ABNORMAL HIGH (ref 0.57–2.63)

## 2011-02-14 LAB — BASIC METABOLIC PANEL
BUN: 34 mg/dL — ABNORMAL HIGH (ref 6–23)
Calcium: 8.8 mg/dL (ref 8.4–10.5)
Glucose, Bld: 119 mg/dL — ABNORMAL HIGH (ref 70–99)
Sodium: 141 mEq/L (ref 135–145)

## 2011-02-18 ENCOUNTER — Encounter (HOSPITAL_BASED_OUTPATIENT_CLINIC_OR_DEPARTMENT_OTHER): Payer: Medicare Other | Admitting: Oncology

## 2011-02-18 DIAGNOSIS — N049 Nephrotic syndrome with unspecified morphologic changes: Secondary | ICD-10-CM

## 2011-02-18 DIAGNOSIS — R809 Proteinuria, unspecified: Secondary | ICD-10-CM

## 2011-02-18 DIAGNOSIS — E119 Type 2 diabetes mellitus without complications: Secondary | ICD-10-CM

## 2011-02-18 DIAGNOSIS — C9000 Multiple myeloma not having achieved remission: Secondary | ICD-10-CM

## 2011-02-24 ENCOUNTER — Ambulatory Visit: Payer: Medicare Other | Attending: Radiation Oncology | Admitting: Radiation Oncology

## 2011-03-25 ENCOUNTER — Other Ambulatory Visit: Payer: Self-pay | Admitting: Oncology

## 2011-03-25 ENCOUNTER — Encounter (HOSPITAL_BASED_OUTPATIENT_CLINIC_OR_DEPARTMENT_OTHER): Payer: Medicare Other | Admitting: Oncology

## 2011-03-25 DIAGNOSIS — R809 Proteinuria, unspecified: Secondary | ICD-10-CM

## 2011-03-25 DIAGNOSIS — N049 Nephrotic syndrome with unspecified morphologic changes: Secondary | ICD-10-CM

## 2011-03-25 DIAGNOSIS — C9000 Multiple myeloma not having achieved remission: Secondary | ICD-10-CM

## 2011-03-25 LAB — CBC WITH DIFFERENTIAL/PLATELET
BASO%: 0.2 % (ref 0.0–2.0)
EOS%: 2.2 % (ref 0.0–7.0)
HCT: 36.9 % — ABNORMAL LOW (ref 38.4–49.9)
MCH: 28.3 pg (ref 27.2–33.4)
MCHC: 33.3 g/dL (ref 32.0–36.0)
MCV: 85 fL (ref 79.3–98.0)
MONO%: 9 % (ref 0.0–14.0)
NEUT%: 72.1 % (ref 39.0–75.0)
RDW: 15 % — ABNORMAL HIGH (ref 11.0–14.6)
lymph#: 1 10*3/uL (ref 0.9–3.3)

## 2011-03-25 NOTE — Discharge Summary (Signed)
William Gaines, William Gaines               ACCOUNT NO.:  [id number]   MEDICAL RECORD NO.:  [id number]          PATIENT TYPE:  INP   LOCATION:  3011                         FACILITY:  [facility]   PHYSICIAN:  Donalee Citrin, M.D.        DATE OF BIRTH:  [birth year]   DATE OF ADMISSION:  02/05/2007  DATE OF DISCHARGE:  02/08/2007                               DISCHARGE SUMMARY   ADMISSION DIAGNOSIS:  Multiple myeloma with L2 and L4 vertebral body  involvement.   PROCEDURES THIS HOSPITALIZATION:  An L2 __________ vertebrectomy and  posterior lumbar stabilization from L1-5.   HOSPITAL COURSE:  Patient was admitted.  __________  Micah Flesher to the  operating room.  Underwent the aforementioned procedure.  Postoperatively did very well in the recovery room.  On the floor, the  patient was mobilized in his brace.  He was doing very well.  Significant improvement in strength and feeling in his lower  extremities.  Patient was initiated on Lovenox and managed  prophylactically for DVT.  Patient was seen by hematology while in the  hospital and patient continued to do well with therapy and was able to  be discharged home with outpatient therapy on April 3.  At the time of  discharge, the patient was ambulating and voiding spontaneously,  tolerating pain and p.o. medications.           ______________________________  Donalee Citrin, M.D.     GC/MEDQ  D:  03/29/2007  T:  03/29/2007  Job:  [id number]

## 2011-03-25 NOTE — Op Note (Signed)
William Gaines, William Gaines               ACCOUNT NO.:  [id number]   MEDICAL RECORD NO.:  [id number]          PATIENT TYPE:  INP   LOCATION:  2550                         FACILITY:  [facility]   PHYSICIAN:  Donalee Citrin, M.D.        DATE OF BIRTH:  Mar 11, 1941   DATE OF PROCEDURE:  02/05/2007  DATE OF DISCHARGE:                               OPERATIVE REPORT   PREOPERATIVE DIAGNOSIS:  Multiple myeloma with pathologic burst fracture  of L2, severe lumbar spinal stenosis at L2.  Multiple involvement at  multiple vertebral levels with lumbar spinal stenosis L4-5 with left-  sided L5 radiculopathy.   POSTOPERATIVE DIAGNOSIS:   PROCEDURE:  1. Posterior transpedicular vertebrectomy at L2 with reconstruction      using a Pyramesh ovoid cage packed, filled with methyl methacrylate      both in the cage and in the corpectomy vertebrectomy defect.  2. Right-sided L4-5 decompressive laminectomy with microscopic      dissection of the right L5 nerve root and microscopic foraminotomy.  3. Posterior segmental fixation L1 to L5 using the 6.35 Legacy pedicle      screw system with posterolateral arthrodesis L1 to L5 using      Mastergraft matrix synthetic bone substitute secondary to the      patient's history of multiple myeloma.   SURGEON:  Donalee Citrin, M.D.   ASSISTANT:  Marikay Alar, M.D.   ANESTHESIA:  General endotracheal.   HISTORY OF PRESENT ILLNESS:  The patient is a very pleasant 70 year old  gentleman who has had multiple myeloma diagnosed several months ago when  he presented with stenosis at L4-5.  The patient received maximal  medical treatment with chemotherapy and radiation.  The patient  responded well at L4-5, however developed a pathologic burst fracture at  L2 causing severe and critical lumbar spinal stenosis and spinal cord  compression with a recent loss of the height of his L2 vertebral body.  Due to the patient's progression at a level above at L2 as well as some  residual stenosis  and some persistent L5 radicular symptoms and the  failure of conservative treatment and medical treatment, the patient was  recommended decompression, reconstruction and stabilization procedure.  Due to his involvement of both the L2 vertebral body as well as the L4-5  posterior elements it was felt that best to do this with a posterior  approach.   The risks and benefits of the operation were explained to the patient  and family, and they understand and agree to proceed forward.   OPERATION:  The patient was brought to the operating room and was  induced under anesthesia.  He was positioned prone on the Wilson frame.  The back was prepped and draped in the usual sterile fashion.  A midline  incision was made infiltration with 10 cc of lidocaine with epinephrine,  and Bovie electrocautery was used to take down the subcutaneous tissue.  Subperiosteal dissection was carried out on the lamina of L2, 3, 4 and 5  bilaterally.  TP's at L1, L2, L3, L4 and L5 were also dissected out.  After adequate exposure had been obtained,  intraoperative x-ray  confirmed localization and identification of the L3 pedicle.  So it was  felt to start here with decompression and vertebrectomy at L2.  The  posterior elements, spinous process and lamina were completely removed  at L2.  Complete medial facetectomies were performed at L2 skeletonizing  the L2 nerve root out the foramen.  The L1-2 disk space was identified  as well as the L2-3 disk space with the L3 nerve root skeletonized along  the L3 pedicle.   After identification and incising both disk spaces as margins, attention  was taken to the transpedicular decompression.  So, with the thecal sac  and nerve root protected both pedicles at L2 were drilled medially and  into the vertebral body defect.  The L2 vertebral bodies were markedly  collapsed and disintegrated with tumor riddled throughout it.  After  this was drilled in the vertebral body and  several large components of  the residual tumor, bone as well as tumor specimen were sent off to  pathology and removed, this radically decompressed the ventral aspect of  the thecal sac which had been noted to be markedly stenotic on the  initial part of the decompression.   Both end plates at L2 and L3 were scraped and squared off taking care to  preserve the bony end-plate.  Then fluoroscopy confirmed the depth of  the corpectomy leaving a little bit of residual rim as protection from  the peritoneal structures and vasculature.  The remainder of this was  squared off.  Then a medium size ovoid Pyramesh cage was selected, about  28 mm using calipers to measure.  This was inserted into the corpectomy  defect from behind taking care to thread it around the L2 nerve root and  then was turned and rotated into position, had good apposition of the  end plates at L1 and L3.  After this was positioned and fluoroscopy  confirmed good position both in the lateral and in the AP trajectory,  methyl methacrylate was mixed up and in liquid form was injected through  a large 60-mL syringe with a 14-gauge Angiocath into the cage filling up  the cage and then filling up the area around the cage, taking care not  to allow any methyl methacrylate to create any ventral stenosis.   After this had all been inserted and hardened up, attention was taken to  the L4-5 decompression.  Using a high-speed drill, immediately grasped  with _______ circumflex drilled down using a 3-mm Kerrison punch.  ________ medial facet complex was taken off and bent off.  The  ligamentum flavum was then removed in piecemeal fashion exposing the  thecal sac and proximal L5 nerve root.  This was all underbitten  laterally. At this point the operative microscope draped _______  elimination.  The undersurface of the gutter was again underbitten to  gain lateral access to the lateral aspect of the L5 nerve root.  The L5 nerve root  was identified flush with the pedicle, decompressed as  foramen.  There was a marked stenosis from what appeared to be  degenerative synovial cyst versus tumor, probably tumor coming off the  facet of L4-5.  After this was all removed in piecemeal fashion  decompressing the L5 root, the coronary dilator was used to explore the  5 and 4 foramen, and both were noted to be decompressed.  Then Gelfoam  was overlaid around this site.  The microscope was  removed. Attention  was taken back to the reconstruction phase.   At this point I scrubbed out, put a light on and using a high-speed  drill pilot holes were drilled at L1 on the right genuinely with the  _______ probed from within the pedicle as well as within the canal.  Tapped with a 5.5 tap and 65/45 screws inserted at L1 on the right.  Fluoroscopy confirmed good depth and correct trajectory and depth of the  pedicle screws as well as probing from within the pedicle and within the  canal and using the external bony landmarks confirmed good position of  the screw.  The L3 screw was inserted in similar fashion as well as the  L4 screw and the L5 screw on the right.  The L4 screw on the right broke  out laterally, so it was repositioned more medially and had excellent  bony purchase.   Then the screws on the left side in similar fashion, L1, L3, L4 and 5  were inserted.  Again the L4 screw on the left due to I think the  abnormal anatomy from this tumor at L4, this screw broke out inferiorly  and laterally.  So, a new pilot hole was drilled after utilizing AP  fluoroscopy to confirm good position as well as lateral fluoroscopy.  A  new pilot hole was drilled in the trajectory.  After all 8 screws were  placed fluoroscopy confirmed good position and trajectory of all 8  screws.  Then the wound was copiously irrigated.  Excellent hemostasis  was obtained.  Aggressive decortication was carried out in the TPs and  lateral gutters.  Mastergraft  Matrix was packed in the lateral gutters  along the TPs.  Rods were fashioned and cut, slightly lordosed.  Nuts  were tightened down at L5, 4 and 3 as well as the L1 was gently  compressed against L3 just to load the cage a little bit but not so much  to fracture the endplate.   Then the wound was inspected.  Meticulous hemostasis was maintained.  Gelfoam was placed on top of the dura.  A _______ cross loop was  inserted.  Postop fluoroscopy confirmed good position of the screws,  rods and bone graft.  Then the wound was closed in layers with Vicryl,  and the skin was closed with running nylon secondary to his medical  condition and preoperative chemotherapy and radiation. At the end of  case the counts were correct.           ______________________________  Donalee Citrin, M.D.     GC/MEDQ  D:  02/05/2007  T:  02/06/2007  Job:  [id number]

## 2011-03-25 NOTE — Consult Note (Signed)
William Gaines, William Gaines               ACCOUNT NO.:  [id number]   MEDICAL RECORD NO.:  [id number]          PATIENT TYPE:  OUT   LOCATION:  XRAY                         FACILITY:  [facility]   PHYSICIAN:  Marin Roberts, MDDATE OF BIRTH:  1941/01/21   DATE OF CONSULTATION:  DATE OF DISCHARGE:  09/26/2006                                   CONSULTATION   CHIEF COMPLAINT:  Back pain.   HISTORY OF PRESENT ILLNESS:  This is a pleasant 70 year old male who has a  long history of back pain.  The patient was evaluated by Dr. Wynetta Emery and  subsequently referred to Dr. Cyndie Chime.  We do not have all of those  records. The patient had a CT-guided L4 needle biopsy performed by Dr. Lowella Dandy  on September 08, 2006, for multiple vertebral body lesions and central canal  stenosis with a question of  metastatic disease of unknown etiology.  Apparently this biopsy was unrevealing. The patient has had a bone marrow  biopsy and some other studies performed.  He has a tentative diagnosis of  multiple myeloma; however, this has not been confirmed. Radiation therapy  was recommended with possible vertebral body augmentation.  The patient has  been referred to Dr. Alfredo Batty for further evaluation.  The patient presents  today with his wife and daughter for this consult.   PAST MEDICAL HISTORY:  1. Significant for borderline diabetes mellitus.  The patient was recently      put on steroid therapy and may require medical management of his      diabetes.  2. He has borderline hyperlipidemia.  3. He has a history of hypertension is on multiple medications for this      problem.  4. He has gastroesophageal reflux disease.  5. Chronic renal insufficiency.  6. A remote history of gout.  7. He had a normal echocardiogram performed on July 17, 2006, except      for minimal tricuspid regurgitation.  The ejection fraction was 60%.   SURGICAL HISTORY:  Significant for testicular surgery.  He has had some skin  cancer  lesions removed as well.   ALLERGIES:  LORAZEPAM given for a bone marrow biopsy caused extreme  agitation. He has no other known allergies.  He denies allergies to shrimp,  iodine, latex or shellfish.   CURRENT MEDICATIONS:  1. Benicar 40 mg daily.  2. Diltiazem 420 mg daily.  3. Allopurinol 300 mg daily.  4. Lisinopril 40 mg daily.  5. Metoprolol 50 mg b.i.d.  6. Doxazosin 8 mg at bedtime.  7. Lasix 40 mg daily.  8. He is also now on steroid therapy.  9. Pepcid.  10.Aspirin 325 mg daily.  11.Percocet p.r.n.  12.Morphine patch for pain.   SOCIAL HISTORY:  The patient is married. He has four children.  He lives in  [place]. He did smoke up to two packs of cigarettes per day for greater  than 40 years.  He quit in 1997.  He does not use alcohol.  He is retired  from [employer].   FAMILY HISTORY:  His mother died in her  80s from complications of diabetes  and congestive heart failure. His father died in his 4s from COPD.  He has  one sister alive and well.  Two brothers, one of whom has kidney cancer.   IMPRESSION AND PLAN:  Unfortunately, we did not have the patient's recent  MRI films to review for the consult today. The images were mistakenly taken  to [facility] instead of [facility]. We did discuss the  vertebroplasty procedure in detail with the patient as well as the  ablation  procedure.  The procedures were described in great detail along with risks  and benefits.  All of the patient's and his family's questions were  answered.  The patient is anxious to proceed for relief of pain and  stabilization of the fracture.  Other treatment options were discussed as  well such as continued pain management with narcotic pain medications.  As  noted, the patient's family are anxious to proceed.  We will try to schedule  his intervention for Friday, September 29, 2006, pending evaluation of his  MRI study by Dr. Alfredo Batty.  Dr. Alfredo Batty will also contact  Dr. Cyndie Chime to  discuss this case.   Greater than 40 minutes was spent on this consult.      Delton See, P.A.    ______________________________  Marin Roberts, MD    DR/MEDQ  D:  09/26/2006  T:  09/26/2006  Job:  [id number]   cc:   Dellis Anes. Idell Pickles, M.D.  Donalee Citrin, M.D.  Genene Churn. Cyndie Chime, M.D.

## 2011-03-28 LAB — BASIC METABOLIC PANEL
BUN: 38 mg/dL — ABNORMAL HIGH (ref 6–23)
Calcium: 8.8 mg/dL (ref 8.4–10.5)
Potassium: 4.2 mEq/L (ref 3.5–5.3)

## 2011-03-28 LAB — KAPPA/LAMBDA LIGHT CHAINS
Kappa free light chain: 32.5 mg/dL — ABNORMAL HIGH (ref 0.33–1.94)
Lambda Free Lght Chn: 4.07 mg/dL — ABNORMAL HIGH (ref 0.57–2.63)

## 2011-03-30 LAB — UIFE/LIGHT CHAINS/TP QN, 24-HR UR
Albumin, U: DETECTED
Alpha 1, Urine: DETECTED — AB
Alpha 2, Urine: DETECTED — AB
Beta, Urine: DETECTED — AB
Free Kappa/Lambda Ratio: 37.57 ratio — ABNORMAL HIGH (ref 0.46–4.00)
Free Lt Chn Excr Rate: 1548 mg/d
Total Protein, Urine-Ur/day: 5822 mg/d — ABNORMAL HIGH (ref 10–140)
Total Protein, Urine: 291.1 mg/dL

## 2011-03-30 LAB — CREATININE CLEARANCE, URINE, 24 HOUR
Collection Interval-CRCL: 24 hours
Creatinine, Urine: 110.8 mg/dL
Creatinine: 1.9 mg/dL — ABNORMAL HIGH (ref 0.40–1.50)

## 2011-04-01 ENCOUNTER — Encounter (HOSPITAL_BASED_OUTPATIENT_CLINIC_OR_DEPARTMENT_OTHER): Payer: Medicare Other | Admitting: Oncology

## 2011-04-01 ENCOUNTER — Other Ambulatory Visit: Payer: Self-pay | Admitting: Oncology

## 2011-04-01 DIAGNOSIS — C9000 Multiple myeloma not having achieved remission: Secondary | ICD-10-CM

## 2011-04-01 DIAGNOSIS — N19 Unspecified kidney failure: Secondary | ICD-10-CM

## 2011-04-01 DIAGNOSIS — N049 Nephrotic syndrome with unspecified morphologic changes: Secondary | ICD-10-CM

## 2011-04-01 DIAGNOSIS — E119 Type 2 diabetes mellitus without complications: Secondary | ICD-10-CM

## 2011-05-27 ENCOUNTER — Ambulatory Visit (HOSPITAL_COMMUNITY)
Admission: RE | Admit: 2011-05-27 | Discharge: 2011-05-27 | Disposition: A | Discharge status: Home / Self Care | Payer: Medicare Other | Source: Ambulatory Visit | Attending: Oncology | Admitting: Oncology

## 2011-05-27 ENCOUNTER — Encounter (HOSPITAL_BASED_OUTPATIENT_CLINIC_OR_DEPARTMENT_OTHER): Payer: Medicare Other | Admitting: Oncology

## 2011-05-27 ENCOUNTER — Other Ambulatory Visit: Payer: Self-pay | Admitting: Oncology

## 2011-05-27 DIAGNOSIS — E119 Type 2 diabetes mellitus without complications: Secondary | ICD-10-CM

## 2011-05-27 DIAGNOSIS — N049 Nephrotic syndrome with unspecified morphologic changes: Secondary | ICD-10-CM | POA: Insufficient documentation

## 2011-05-27 DIAGNOSIS — C9000 Multiple myeloma not having achieved remission: Secondary | ICD-10-CM

## 2011-05-27 DIAGNOSIS — R809 Proteinuria, unspecified: Secondary | ICD-10-CM

## 2011-05-27 DIAGNOSIS — M545 Low back pain, unspecified: Secondary | ICD-10-CM | POA: Insufficient documentation

## 2011-05-27 DIAGNOSIS — M899 Disorder of bone, unspecified: Secondary | ICD-10-CM | POA: Insufficient documentation

## 2011-05-27 LAB — CBC WITH DIFFERENTIAL/PLATELET
BASO%: 0.2 % (ref 0.0–2.0)
Eosinophils Absolute: 0.2 10*3/uL (ref 0.0–0.5)
HCT: 34.9 % — ABNORMAL LOW (ref 38.4–49.9)
LYMPH%: 14.8 % (ref 14.0–49.0)
MCHC: 34.3 g/dL (ref 32.0–36.0)
MCV: 87.4 fL (ref 79.3–98.0)
MONO#: 0.4 10*3/uL (ref 0.1–0.9)
MONO%: 6.4 % (ref 0.0–14.0)
NEUT%: 75.5 % — ABNORMAL HIGH (ref 39.0–75.0)
Platelets: 162 10*3/uL (ref 140–400)
RBC: 4 10*6/uL — ABNORMAL LOW (ref 4.20–5.82)
WBC: 6.9 10*3/uL (ref 4.0–10.3)

## 2011-05-30 LAB — COMPREHENSIVE METABOLIC PANEL
ALT: 26 U/L (ref 0–53)
AST: 18 U/L (ref 0–37)
Albumin: 3.6 g/dL (ref 3.5–5.2)
Alkaline Phosphatase: 60 U/L (ref 39–117)
BUN: 39 mg/dL — ABNORMAL HIGH (ref 6–23)
CO2: 27 mEq/L (ref 19–32)
Calcium: 8.9 mg/dL (ref 8.4–10.5)
Chloride: 101 mEq/L (ref 96–112)
Creatinine, Ser: 2.12 mg/dL — ABNORMAL HIGH (ref 0.50–1.35)
Glucose, Bld: 95 mg/dL (ref 70–99)
Potassium: 4.3 mEq/L (ref 3.5–5.3)
Sodium: 141 mEq/L (ref 135–145)
Total Bilirubin: 0.4 mg/dL (ref 0.3–1.2)
Total Protein: 6.8 g/dL (ref 6.0–8.3)

## 2011-05-30 LAB — KAPPA/LAMBDA LIGHT CHAINS
Kappa free light chain: 61.2 mg/dL — ABNORMAL HIGH (ref 0.33–1.94)
Kappa:Lambda Ratio: 12.91 — ABNORMAL HIGH (ref 0.26–1.65)
Lambda Free Lght Chn: 4.74 mg/dL — ABNORMAL HIGH (ref 0.57–2.63)

## 2011-06-03 ENCOUNTER — Encounter (HOSPITAL_BASED_OUTPATIENT_CLINIC_OR_DEPARTMENT_OTHER): Payer: Medicare Other | Admitting: Oncology

## 2011-06-03 DIAGNOSIS — C9000 Multiple myeloma not having achieved remission: Secondary | ICD-10-CM

## 2011-06-10 ENCOUNTER — Encounter (HOSPITAL_BASED_OUTPATIENT_CLINIC_OR_DEPARTMENT_OTHER): Payer: Medicare Other | Admitting: Hematology & Oncology

## 2011-06-10 DIAGNOSIS — C9000 Multiple myeloma not having achieved remission: Secondary | ICD-10-CM

## 2011-06-10 DIAGNOSIS — Z5112 Encounter for antineoplastic immunotherapy: Secondary | ICD-10-CM

## 2011-06-17 ENCOUNTER — Other Ambulatory Visit: Payer: Self-pay | Admitting: Oncology

## 2011-06-17 ENCOUNTER — Encounter (HOSPITAL_BASED_OUTPATIENT_CLINIC_OR_DEPARTMENT_OTHER): Payer: Medicare Other | Admitting: Hematology & Oncology

## 2011-06-17 DIAGNOSIS — C9000 Multiple myeloma not having achieved remission: Secondary | ICD-10-CM

## 2011-06-17 DIAGNOSIS — Z5112 Encounter for antineoplastic immunotherapy: Secondary | ICD-10-CM

## 2011-06-17 LAB — CBC WITH DIFFERENTIAL (CANCER CENTER ONLY)
BASO%: 0.4 % (ref 0.0–2.0)
LYMPH%: 14 % (ref 14.0–48.0)
MCV: 87 fL (ref 82–98)
MONO#: 0.1 10*3/uL (ref 0.1–0.9)
NEUT#: 4.2 10*3/uL (ref 1.5–6.5)
Platelets: 119 10*3/uL — ABNORMAL LOW (ref 145–400)
RDW: 13.9 % (ref 11.1–15.7)
WBC: 5.2 10*3/uL (ref 4.0–10.0)

## 2011-06-24 ENCOUNTER — Encounter (HOSPITAL_BASED_OUTPATIENT_CLINIC_OR_DEPARTMENT_OTHER): Payer: Medicare Other | Admitting: Oncology

## 2011-06-24 ENCOUNTER — Other Ambulatory Visit: Payer: Self-pay | Admitting: Oncology

## 2011-06-24 DIAGNOSIS — C9000 Multiple myeloma not having achieved remission: Secondary | ICD-10-CM

## 2011-06-24 DIAGNOSIS — E119 Type 2 diabetes mellitus without complications: Secondary | ICD-10-CM

## 2011-06-24 DIAGNOSIS — N049 Nephrotic syndrome with unspecified morphologic changes: Secondary | ICD-10-CM

## 2011-06-24 DIAGNOSIS — N19 Unspecified kidney failure: Secondary | ICD-10-CM

## 2011-06-24 LAB — CBC WITH DIFFERENTIAL/PLATELET
BASO%: 0.1 % (ref 0.0–2.0)
HCT: 29.4 % — ABNORMAL LOW (ref 38.4–49.9)
MCHC: 35.1 g/dL (ref 32.0–36.0)
MONO#: 0.3 10*3/uL (ref 0.1–0.9)
NEUT%: 71.9 % (ref 39.0–75.0)
RBC: 3.4 10*6/uL — ABNORMAL LOW (ref 4.20–5.82)
WBC: 2.9 10*3/uL — ABNORMAL LOW (ref 4.0–10.3)
lymph#: 0.5 10*3/uL — ABNORMAL LOW (ref 0.9–3.3)

## 2011-07-01 ENCOUNTER — Other Ambulatory Visit: Payer: Self-pay | Admitting: Oncology

## 2011-07-01 ENCOUNTER — Encounter (HOSPITAL_BASED_OUTPATIENT_CLINIC_OR_DEPARTMENT_OTHER): Payer: Medicare Other | Admitting: Oncology

## 2011-07-01 DIAGNOSIS — N049 Nephrotic syndrome with unspecified morphologic changes: Secondary | ICD-10-CM

## 2011-07-01 DIAGNOSIS — R609 Edema, unspecified: Secondary | ICD-10-CM

## 2011-07-01 DIAGNOSIS — C9000 Multiple myeloma not having achieved remission: Secondary | ICD-10-CM

## 2011-07-01 DIAGNOSIS — E119 Type 2 diabetes mellitus without complications: Secondary | ICD-10-CM

## 2011-07-01 DIAGNOSIS — N19 Unspecified kidney failure: Secondary | ICD-10-CM

## 2011-07-01 LAB — COMPREHENSIVE METABOLIC PANEL
AST: 15 U/L (ref 0–37)
Albumin: 3.6 g/dL (ref 3.5–5.2)
Alkaline Phosphatase: 89 U/L (ref 39–117)
Potassium: 4.2 mEq/L (ref 3.5–5.3)
Sodium: 142 mEq/L (ref 135–145)
Total Bilirubin: 0.3 mg/dL (ref 0.3–1.2)
Total Protein: 6.6 g/dL (ref 6.0–8.3)

## 2011-07-01 LAB — CBC WITH DIFFERENTIAL/PLATELET
Basophils Absolute: 0 10*3/uL (ref 0.0–0.1)
Eosinophils Absolute: 0.1 10*3/uL (ref 0.0–0.5)
HCT: 27.9 % — ABNORMAL LOW (ref 38.4–49.9)
HGB: 9.7 g/dL — ABNORMAL LOW (ref 13.0–17.1)
MONO#: 0.3 10*3/uL (ref 0.1–0.9)
NEUT%: 73.4 % (ref 39.0–75.0)
WBC: 2.5 10*3/uL — ABNORMAL LOW (ref 4.0–10.3)
lymph#: 0.3 10*3/uL — ABNORMAL LOW (ref 0.9–3.3)

## 2011-07-07 ENCOUNTER — Other Ambulatory Visit: Payer: Self-pay | Admitting: Oncology

## 2011-07-07 ENCOUNTER — Other Ambulatory Visit: Payer: Self-pay | Admitting: Hematology & Oncology

## 2011-07-07 ENCOUNTER — Encounter (HOSPITAL_BASED_OUTPATIENT_CLINIC_OR_DEPARTMENT_OTHER): Payer: Medicare Other | Admitting: Hematology & Oncology

## 2011-07-07 DIAGNOSIS — Z5181 Encounter for therapeutic drug level monitoring: Secondary | ICD-10-CM

## 2011-07-07 LAB — BASIC METABOLIC PANEL - CANCER CENTER ONLY
CO2: 29 mEq/L (ref 18–33)
Chloride: 99 mEq/L (ref 98–108)
Creat: 2.1 mg/dl — ABNORMAL HIGH (ref 0.6–1.2)
Sodium: 137 mEq/L (ref 128–145)

## 2011-07-07 LAB — CBC WITH DIFFERENTIAL (CANCER CENTER ONLY)
BASO%: 0.3 % (ref 0.0–2.0)
EOS%: 1.3 % (ref 0.0–7.0)
HCT: 29.3 % — ABNORMAL LOW (ref 38.7–49.9)
LYMPH#: 0.5 10*3/uL — ABNORMAL LOW (ref 0.9–3.3)
MCHC: 34.1 g/dL (ref 32.0–35.9)
MONO#: 0.4 10*3/uL (ref 0.1–0.9)
NEUT#: 2.2 10*3/uL (ref 1.5–6.5)
NEUT%: 71.4 % (ref 40.0–80.0)
Platelets: 195 10*3/uL (ref 145–400)
RDW: 15.2 % (ref 11.1–15.7)
WBC: 3.1 10*3/uL — ABNORMAL LOW (ref 4.0–10.0)

## 2011-07-15 ENCOUNTER — Other Ambulatory Visit: Payer: Self-pay | Admitting: Oncology

## 2011-07-15 ENCOUNTER — Encounter (HOSPITAL_BASED_OUTPATIENT_CLINIC_OR_DEPARTMENT_OTHER): Payer: Medicare Other | Admitting: Oncology

## 2011-07-15 DIAGNOSIS — R609 Edema, unspecified: Secondary | ICD-10-CM

## 2011-07-15 DIAGNOSIS — C9000 Multiple myeloma not having achieved remission: Secondary | ICD-10-CM

## 2011-07-15 DIAGNOSIS — Z5112 Encounter for antineoplastic immunotherapy: Secondary | ICD-10-CM

## 2011-07-15 LAB — CBC WITH DIFFERENTIAL/PLATELET
Basophils Absolute: 0 10*3/uL (ref 0.0–0.1)
Eosinophils Absolute: 0 10*3/uL (ref 0.0–0.5)
HCT: 30.7 % — ABNORMAL LOW (ref 38.4–49.9)
HGB: 10.5 g/dL — ABNORMAL LOW (ref 13.0–17.1)
MCV: 88.7 fL (ref 79.3–98.0)
MONO%: 12.6 % (ref 0.0–14.0)
NEUT#: 3 10*3/uL (ref 1.5–6.5)
NEUT%: 72.7 % (ref 39.0–75.0)
RDW: 17.7 % — ABNORMAL HIGH (ref 11.0–14.6)
lymph#: 0.5 10*3/uL — ABNORMAL LOW (ref 0.9–3.3)

## 2011-07-15 LAB — BASIC METABOLIC PANEL
BUN: 36 mg/dL — ABNORMAL HIGH (ref 6–23)
CO2: 28 mEq/L (ref 19–32)
Calcium: 9.3 mg/dL (ref 8.4–10.5)
Creatinine, Ser: 2.31 mg/dL — ABNORMAL HIGH (ref 0.50–1.35)
Glucose, Bld: 147 mg/dL — ABNORMAL HIGH (ref 70–99)
Sodium: 141 mEq/L (ref 135–145)

## 2011-07-22 ENCOUNTER — Other Ambulatory Visit: Payer: Self-pay | Admitting: Oncology

## 2011-07-22 ENCOUNTER — Encounter (HOSPITAL_BASED_OUTPATIENT_CLINIC_OR_DEPARTMENT_OTHER): Payer: Medicare Other | Admitting: Oncology

## 2011-07-22 DIAGNOSIS — Z5112 Encounter for antineoplastic immunotherapy: Secondary | ICD-10-CM

## 2011-07-22 DIAGNOSIS — C9000 Multiple myeloma not having achieved remission: Secondary | ICD-10-CM

## 2011-07-22 LAB — CBC WITH DIFFERENTIAL/PLATELET
BASO%: 0 % (ref 0.0–2.0)
EOS%: 2.1 % (ref 0.0–7.0)
MCH: 30.8 pg (ref 27.2–33.4)
MCHC: 34.6 g/dL (ref 32.0–36.0)
MONO#: 0.4 10*3/uL (ref 0.1–0.9)
RBC: 3.33 10*6/uL — ABNORMAL LOW (ref 4.20–5.82)
RDW: 18.4 % — ABNORMAL HIGH (ref 11.0–14.6)
WBC: 5 10*3/uL (ref 4.0–10.3)
lymph#: 0.5 10*3/uL — ABNORMAL LOW (ref 0.9–3.3)

## 2011-07-22 LAB — BASIC METABOLIC PANEL
CO2: 28 mEq/L (ref 19–32)
Chloride: 104 mEq/L (ref 96–112)
Creatinine, Ser: 2.12 mg/dL — ABNORMAL HIGH (ref 0.50–1.35)
Sodium: 143 mEq/L (ref 135–145)

## 2011-07-29 ENCOUNTER — Other Ambulatory Visit: Payer: Self-pay | Admitting: Oncology

## 2011-07-29 ENCOUNTER — Encounter (HOSPITAL_BASED_OUTPATIENT_CLINIC_OR_DEPARTMENT_OTHER): Payer: Medicare Other | Admitting: Oncology

## 2011-07-29 DIAGNOSIS — Z5112 Encounter for antineoplastic immunotherapy: Secondary | ICD-10-CM

## 2011-07-29 DIAGNOSIS — C9000 Multiple myeloma not having achieved remission: Secondary | ICD-10-CM

## 2011-07-29 LAB — CBC WITH DIFFERENTIAL/PLATELET
BASO%: 0.1 % (ref 0.0–2.0)
LYMPH%: 8.4 % — ABNORMAL LOW (ref 14.0–49.0)
MCHC: 34.9 g/dL (ref 32.0–36.0)
MONO#: 0.2 10*3/uL (ref 0.1–0.9)
NEUT#: 4.6 10*3/uL (ref 1.5–6.5)
Platelets: 144 10*3/uL (ref 140–400)
RBC: 3.32 10*6/uL — ABNORMAL LOW (ref 4.20–5.82)

## 2011-08-01 ENCOUNTER — Other Ambulatory Visit: Payer: Self-pay | Admitting: Oncology

## 2011-08-01 LAB — COMPREHENSIVE METABOLIC PANEL
Albumin: 3.7 g/dL (ref 3.5–5.2)
BUN: 52 mg/dL — ABNORMAL HIGH (ref 6–23)
CO2: 25 mEq/L (ref 19–32)
Calcium: 8.4 mg/dL (ref 8.4–10.5)
Glucose, Bld: 173 mg/dL — ABNORMAL HIGH (ref 70–99)
Potassium: 4 mEq/L (ref 3.5–5.3)
Sodium: 141 mEq/L (ref 135–145)
Total Protein: 6.4 g/dL (ref 6.0–8.3)

## 2011-08-01 LAB — IGG, IGA, IGM
IgA: 337 mg/dL (ref 68–379)
IgM, Serum: 39 mg/dL — ABNORMAL LOW (ref 41–251)

## 2011-08-01 LAB — KAPPA/LAMBDA LIGHT CHAINS
Kappa free light chain: 12.8 mg/dL — ABNORMAL HIGH (ref 0.33–1.94)
Kappa:Lambda Ratio: 2.63 — ABNORMAL HIGH (ref 0.26–1.65)
Lambda Free Lght Chn: 4.86 mg/dL — ABNORMAL HIGH (ref 0.57–2.63)

## 2011-08-01 LAB — LACTATE DEHYDROGENASE: LDH: 82 U/L — ABNORMAL LOW (ref 94–250)

## 2011-08-03 LAB — UIFE/LIGHT CHAINS/TP QN, 24-HR UR
Alpha 2, Urine: DETECTED — AB
Beta, Urine: DETECTED — AB
Free Lambda Lt Chains,Ur: 0.9 mg/dL — ABNORMAL HIGH (ref 0.02–0.67)
Free Lt Chn Excr Rate: 472.6 mg/d
Gamma Globulin, Urine: DETECTED — AB
Total Protein, Urine-Ur/day: 2465 mg/d — ABNORMAL HIGH (ref 10–140)
Volume, Urine: 3400 mL

## 2011-08-12 ENCOUNTER — Other Ambulatory Visit: Payer: Self-pay | Admitting: Oncology

## 2011-08-12 ENCOUNTER — Encounter (HOSPITAL_BASED_OUTPATIENT_CLINIC_OR_DEPARTMENT_OTHER): Payer: Medicare Other | Admitting: Oncology

## 2011-08-12 DIAGNOSIS — Z5112 Encounter for antineoplastic immunotherapy: Secondary | ICD-10-CM

## 2011-08-12 DIAGNOSIS — R609 Edema, unspecified: Secondary | ICD-10-CM

## 2011-08-12 DIAGNOSIS — N049 Nephrotic syndrome with unspecified morphologic changes: Secondary | ICD-10-CM

## 2011-08-12 DIAGNOSIS — C9000 Multiple myeloma not having achieved remission: Secondary | ICD-10-CM

## 2011-08-12 LAB — CBC WITH DIFFERENTIAL/PLATELET
Basophils Absolute: 0 10*3/uL (ref 0.0–0.1)
EOS%: 4.6 % (ref 0.0–7.0)
HCT: 30.1 % — ABNORMAL LOW (ref 38.4–49.9)
HGB: 10.4 g/dL — ABNORMAL LOW (ref 13.0–17.1)
LYMPH%: 13.4 % — ABNORMAL LOW (ref 14.0–49.0)
MCH: 31.5 pg (ref 27.2–33.4)
MONO#: 0.5 10*3/uL (ref 0.1–0.9)
NEUT%: 65.4 % (ref 39.0–75.0)
Platelets: 123 10*3/uL — ABNORMAL LOW (ref 140–400)
lymph#: 0.4 10*3/uL — ABNORMAL LOW (ref 0.9–3.3)

## 2011-08-12 LAB — BASIC METABOLIC PANEL
BUN: 53 mg/dL — ABNORMAL HIGH (ref 6–23)
CO2: 31 mEq/L (ref 19–32)
Calcium: 9.1 mg/dL (ref 8.4–10.5)
Chloride: 101 mEq/L (ref 96–112)
Creatinine, Ser: 2.23 mg/dL — ABNORMAL HIGH (ref 0.50–1.35)

## 2011-08-19 ENCOUNTER — Other Ambulatory Visit: Payer: Self-pay | Admitting: Oncology

## 2011-08-19 ENCOUNTER — Encounter (HOSPITAL_BASED_OUTPATIENT_CLINIC_OR_DEPARTMENT_OTHER): Payer: Medicare Other | Admitting: Oncology

## 2011-08-19 DIAGNOSIS — C9000 Multiple myeloma not having achieved remission: Secondary | ICD-10-CM

## 2011-08-19 DIAGNOSIS — E1165 Type 2 diabetes mellitus with hyperglycemia: Secondary | ICD-10-CM

## 2011-08-19 DIAGNOSIS — Z5112 Encounter for antineoplastic immunotherapy: Secondary | ICD-10-CM

## 2011-08-19 LAB — BASIC METABOLIC PANEL
BUN: 42 mg/dL — ABNORMAL HIGH (ref 6–23)
Chloride: 103 mEq/L (ref 96–112)
Creatinine, Ser: 2.1 mg/dL — ABNORMAL HIGH (ref 0.50–1.35)
Glucose, Bld: 196 mg/dL — ABNORMAL HIGH (ref 70–99)
Potassium: 4.2 mEq/L (ref 3.5–5.3)

## 2011-08-19 LAB — CBC WITH DIFFERENTIAL/PLATELET
Basophils Absolute: 0 10*3/uL (ref 0.0–0.1)
EOS%: 1.2 % (ref 0.0–7.0)
Eosinophils Absolute: 0.1 10*3/uL (ref 0.0–0.5)
HCT: 30.8 % — ABNORMAL LOW (ref 38.4–49.9)
HGB: 10.5 g/dL — ABNORMAL LOW (ref 13.0–17.1)
MCH: 31.2 pg (ref 27.2–33.4)
MCV: 91.7 fL (ref 79.3–98.0)
NEUT#: 4.9 10*3/uL (ref 1.5–6.5)
NEUT%: 83.7 % — ABNORMAL HIGH (ref 39.0–75.0)
RDW: 21.1 % — ABNORMAL HIGH (ref 11.0–14.6)
lymph#: 0.5 10*3/uL — ABNORMAL LOW (ref 0.9–3.3)

## 2011-08-26 ENCOUNTER — Encounter (HOSPITAL_BASED_OUTPATIENT_CLINIC_OR_DEPARTMENT_OTHER): Payer: Medicare Other | Admitting: Oncology

## 2011-08-26 ENCOUNTER — Other Ambulatory Visit: Payer: Self-pay | Admitting: Oncology

## 2011-08-26 DIAGNOSIS — R609 Edema, unspecified: Secondary | ICD-10-CM

## 2011-08-26 DIAGNOSIS — Z5112 Encounter for antineoplastic immunotherapy: Secondary | ICD-10-CM

## 2011-08-26 DIAGNOSIS — C9000 Multiple myeloma not having achieved remission: Secondary | ICD-10-CM

## 2011-08-26 LAB — CBC WITH DIFFERENTIAL/PLATELET
Basophils Absolute: 0 10*3/uL (ref 0.0–0.1)
Eosinophils Absolute: 0.1 10*3/uL (ref 0.0–0.5)
HCT: 30 % — ABNORMAL LOW (ref 38.4–49.9)
MONO#: 0.2 10*3/uL (ref 0.1–0.9)
NEUT%: 86.2 % — ABNORMAL HIGH (ref 39.0–75.0)
lymph#: 0.4 10*3/uL — ABNORMAL LOW (ref 0.9–3.3)

## 2011-08-30 LAB — KAPPA/LAMBDA LIGHT CHAINS: Kappa:Lambda Ratio: 2.5 — ABNORMAL HIGH (ref 0.26–1.65)

## 2011-08-30 LAB — IMMUNOFIXATION ELECTROPHORESIS
IgA: 421 mg/dL — ABNORMAL HIGH (ref 68–379)
IgM, Serum: 77 mg/dL (ref 41–251)

## 2011-08-30 LAB — BASIC METABOLIC PANEL
BUN: 50 mg/dL — ABNORMAL HIGH (ref 6–23)
CO2: 24 mEq/L (ref 19–32)
Calcium: 8.6 mg/dL (ref 8.4–10.5)
Chloride: 101 mEq/L (ref 96–112)
Creatinine, Ser: 2.19 mg/dL — ABNORMAL HIGH (ref 0.50–1.35)

## 2011-09-09 ENCOUNTER — Other Ambulatory Visit: Payer: Self-pay | Admitting: Oncology

## 2011-09-09 ENCOUNTER — Telehealth: Payer: Self-pay | Admitting: Oncology

## 2011-09-09 ENCOUNTER — Encounter (HOSPITAL_BASED_OUTPATIENT_CLINIC_OR_DEPARTMENT_OTHER): Payer: Medicare Other | Admitting: Oncology

## 2011-09-09 DIAGNOSIS — N049 Nephrotic syndrome with unspecified morphologic changes: Secondary | ICD-10-CM

## 2011-09-09 DIAGNOSIS — C9000 Multiple myeloma not having achieved remission: Secondary | ICD-10-CM

## 2011-09-09 DIAGNOSIS — R609 Edema, unspecified: Secondary | ICD-10-CM

## 2011-09-09 DIAGNOSIS — Z5112 Encounter for antineoplastic immunotherapy: Secondary | ICD-10-CM

## 2011-09-09 LAB — CBC WITH DIFFERENTIAL/PLATELET
BASO%: 0.6 % (ref 0.0–2.0)
EOS%: 3.3 % (ref 0.0–7.0)
LYMPH%: 22.1 % (ref 14.0–49.0)
MCH: 30.6 pg (ref 27.2–33.4)
MCHC: 33.6 g/dL (ref 32.0–36.0)
MCV: 91.1 fL (ref 79.3–98.0)
MONO%: 17.3 % — ABNORMAL HIGH (ref 0.0–14.0)
Platelets: 79 10*3/uL — ABNORMAL LOW (ref 140–400)
RBC: 3.37 10*6/uL — ABNORMAL LOW (ref 4.20–5.82)
RDW: 19 % — ABNORMAL HIGH (ref 11.0–14.6)
nRBC: 0 % (ref 0–0)

## 2011-09-09 NOTE — Telephone Encounter (Signed)
gv relative appt schedule for nov.  °

## 2011-09-12 NOTE — Progress Notes (Deleted)
CC:   Matthew A Manning, M.D. Vicki Stovall, MD Shane Anderson, MD  HISTORY OF PRESENT ILLNESS:  Mr. 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  He is currently on active treatment with Velcade 1.5 mg/sq m subcutaneous 3 weeks on, followed by a 1-week break and melphalan 5 mg/sq m 4 days each month. He began the most recent cycle of melphalan on 08/19/2011.  He is seen today prior to proceeding with the next cycle of subcutaneous Velcade.  Mr. Crossno reports that overall he feels well.  He denies nausea or vomiting.  He has an occasional loose stool.  He denies mouth sores.  He has stable numbness involving the fourth digit on the left hand.  This predated the start of Velcade.  Back pain is stable.  Leg swelling is stable to improved.  PHYSICAL EXAMINATION:  Vital Signs:  Temperature 97.3, heart rate 64, respirations 20, blood pressure 135/56, weight 267.8 pounds (268 pounds on 08/12/2011).  HEENT:  Oropharynx is without thrush or ulceration. Lungs:  Clear.  No wheezes or rales.  Cardiac:  Regular cardiac rhythm. No murmur.  Abdomen:  Soft and nontender.  He declined to lay flat on the exam table.  No obvious organomegaly when examined upright.  He appears to have an umbilical hernia.  Extremities:  He has 2 to 3+ pitting edema below the knees.  There are venous stasis changes bilaterally.  Neurologic:  Motor strength is 5/5.  Vibratory sense is mildly decreased to intact over multiple fingertips.  LABORATORIES:  Hemoglobin 10.3, white count 3.3, absolute neutrophil count 1.9, platelet count 79,000.  IMPRESSIONS AND PLAN: 1. 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  Followup     24-hour urine on 08/01/2011 was improved with a significant     decrease in the urine protein, as well as free kappa light chains     in the urine. 2. Thrombocytopenia secondary to  chemotherapy. 3. Chronic nephrotic syndrome. 4. Significant peripheral edema secondary to chronic nephrotic     syndrome. 5. Essential hypertension. 6. Insulin-dependent diabetes. 7. Obesity. 8. Valtrex prophylaxis while on Velcade.  The Valtrex is cost     prohibitive.  We will switch to acyclovir 400 mg twice daily. 9. Disposition:  Mr. Pinho is scheduled to begin the next cycle of     Velcade today.  Due to thrombocytopenia, we are going to delay for     1 week.  The next cycle of melphalan will be due on 09/16/2011 as     well.  He understands that he should not begin the melphalan until     we check a CBC in the office on 09/16/2011.  Mr.     Goldner's next office visit is 10/07/2011.  He will contact the     office in the interim with any problems. Plan discussed with Dr. Sherrill in Dr. Granfortuna's absence.    ______________________________ Cletus Paris C Zeenat Jeanbaptiste, NP LCT/MEDQ  D:  09/09/2011  T:  09/12/2011  Job:  [id number] 

## 2011-09-14 NOTE — Progress Notes (Unsigned)
CC:   Oneita Hurt, M.D. [name], MD Azzie Roup, MD  HISTORY OF PRESENT ILLNESS:  Mr. 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  He is currently on active treatment with Velcade 1.5 mg/sq m subcutaneous 3 weeks on, followed by a 1-week break and melphalan 5 mg/sq m 4 days each month. He began the most recent cycle of melphalan on 08/19/2011.  He is seen today prior to proceeding with the next cycle of subcutaneous Velcade.  Mr. Kelter reports that overall he feels well.  He denies nausea or vomiting.  He has an occasional loose stool.  He denies mouth sores.  He has stable numbness involving the fourth digit on the left hand.  This predated the start of Velcade.  Back pain is stable.  Leg swelling is stable to improved.  PHYSICAL EXAMINATION:  Vital Signs:  Temperature 97.3, heart rate 64, respirations 20, blood pressure 135/56, weight 267.8 pounds (268 pounds on 08/12/2011).  HEENT:  Oropharynx is without thrush or ulceration. Lungs:  Clear.  No wheezes or rales.  Cardiac:  Regular cardiac rhythm. No murmur.  Abdomen:  Soft and nontender.  He declined to lay flat on the exam table.  No obvious organomegaly when examined upright.  He appears to have an umbilical hernia.  Extremities:  He has 2 to 3+ pitting edema below the knees.  There are venous stasis changes bilaterally.  Neurologic:  Motor strength is 5/5.  Vibratory sense is mildly decreased to intact over multiple fingertips.  LABORATORIES:  Hemoglobin 10.3, white count 3.3, absolute neutrophil count 1.9, platelet count 79,000.  IMPRESSIONS AND PLAN: 1. 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  Followup     24-hour urine on 08/01/2011 was improved with a significant     decrease in the urine protein, as well as free kappa light chains     in the urine. 2. Thrombocytopenia secondary to  chemotherapy. 3. Chronic nephrotic syndrome. 4. Significant peripheral edema secondary to chronic nephrotic     syndrome. 5. Essential hypertension. 6. Insulin-dependent diabetes. 7. Obesity. 8. Valtrex prophylaxis while on Velcade.  The Valtrex is cost     prohibitive.  We will switch to acyclovir 400 mg twice daily. 9. Disposition:  Mr. Kasparek is scheduled to begin the next cycle of     Velcade today.  Due to thrombocytopenia, we are going to delay for     1 week.  The next cycle of melphalan will be due on 09/16/2011 as     well.  He understands that he should not begin the melphalan until     we check a CBC in the office on 09/16/2011.  Mr.     Erbes next office visit is 10/07/2011.  He will contact the     office in the interim with any problems. Plan discussed with Dr. Truett Perna in Dr. Patsy Lager absence.    ______________________________ Arnaldo Natal, NP LCT/MEDQ  D:  09/09/2011  T:  09/12/2011  Job:  [id number]

## 2011-09-15 ENCOUNTER — Other Ambulatory Visit: Payer: Self-pay | Admitting: Oncology

## 2011-09-15 DIAGNOSIS — I10 Essential (primary) hypertension: Secondary | ICD-10-CM

## 2011-09-15 DIAGNOSIS — N049 Nephrotic syndrome with unspecified morphologic changes: Secondary | ICD-10-CM | POA: Insufficient documentation

## 2011-09-15 DIAGNOSIS — E1159 Type 2 diabetes mellitus with other circulatory complications: Secondary | ICD-10-CM | POA: Insufficient documentation

## 2011-09-15 DIAGNOSIS — E0842 Diabetes mellitus due to underlying condition with diabetic polyneuropathy: Secondary | ICD-10-CM | POA: Insufficient documentation

## 2011-09-15 DIAGNOSIS — C9000 Multiple myeloma not having achieved remission: Secondary | ICD-10-CM | POA: Insufficient documentation

## 2011-09-15 DIAGNOSIS — N048 Nephrotic syndrome with other morphologic changes: Secondary | ICD-10-CM

## 2011-09-16 ENCOUNTER — Other Ambulatory Visit (HOSPITAL_BASED_OUTPATIENT_CLINIC_OR_DEPARTMENT_OTHER): Payer: Medicare Other | Admitting: Lab

## 2011-09-16 ENCOUNTER — Ambulatory Visit (HOSPITAL_BASED_OUTPATIENT_CLINIC_OR_DEPARTMENT_OTHER): Payer: Medicare Other

## 2011-09-16 DIAGNOSIS — C9000 Multiple myeloma not having achieved remission: Secondary | ICD-10-CM

## 2011-09-16 DIAGNOSIS — N08 Glomerular disorders in diseases classified elsewhere: Secondary | ICD-10-CM

## 2011-09-16 DIAGNOSIS — Z5112 Encounter for antineoplastic immunotherapy: Secondary | ICD-10-CM

## 2011-09-16 LAB — CBC WITH DIFFERENTIAL/PLATELET
Basophils Absolute: 0 10*3/uL (ref 0.0–0.1)
Eosinophils Absolute: 0.1 10*3/uL (ref 0.0–0.5)
HCT: 29.6 % — ABNORMAL LOW (ref 38.4–49.9)
LYMPH%: 15.2 % (ref 14.0–49.0)
MCH: 30.7 pg (ref 27.2–33.4)
MCHC: 33.1 g/dL (ref 32.0–36.0)
MONO#: 0.5 10*3/uL (ref 0.1–0.9)
MONO%: 9.3 % (ref 0.0–14.0)
RBC: 3.19 10*6/uL — ABNORMAL LOW (ref 4.20–5.82)
RDW: 18.7 % — ABNORMAL HIGH (ref 11.0–14.6)
lymph#: 0.9 10*3/uL (ref 0.9–3.3)

## 2011-09-16 MED ORDER — BORTEZOMIB CHEMO SQ INJECTION 3.5 MG (2.5MG/ML)
1.0000 mg/m2 | Freq: Once | INTRAMUSCULAR | Status: AC
  Administered 2011-09-16: 2.25 mg via SUBCUTANEOUS
  Filled 2011-09-16: qty 2.25

## 2011-09-16 MED ORDER — ONDANSETRON HCL 8 MG PO TABS: 8.0000 mg | ORAL_TABLET | Freq: Once | ORAL | Status: DC

## 2011-09-19 NOTE — Progress Notes (Signed)
Received fax notification from [employer] that pt's Alkeran shipped on 11/7. dph

## 2011-09-22 ENCOUNTER — Other Ambulatory Visit: Payer: Self-pay | Admitting: Oncology

## 2011-09-23 ENCOUNTER — Other Ambulatory Visit: Payer: Self-pay | Admitting: Oncology

## 2011-09-23 ENCOUNTER — Other Ambulatory Visit (HOSPITAL_BASED_OUTPATIENT_CLINIC_OR_DEPARTMENT_OTHER): Payer: Medicare Other

## 2011-09-23 ENCOUNTER — Ambulatory Visit (HOSPITAL_BASED_OUTPATIENT_CLINIC_OR_DEPARTMENT_OTHER): Payer: Medicare Other

## 2011-09-23 DIAGNOSIS — C9000 Multiple myeloma not having achieved remission: Secondary | ICD-10-CM

## 2011-09-23 DIAGNOSIS — Z5112 Encounter for antineoplastic immunotherapy: Secondary | ICD-10-CM

## 2011-09-23 LAB — CBC WITH DIFFERENTIAL/PLATELET
BASO%: 0.2 % (ref 0.0–2.0)
Basophils Absolute: 0 10*3/uL (ref 0.0–0.1)
HCT: 28.2 % — ABNORMAL LOW (ref 38.4–49.9)
HGB: 9.5 g/dL — ABNORMAL LOW (ref 13.0–17.1)
LYMPH%: 9.8 % — ABNORMAL LOW (ref 14.0–49.0)
MCH: 32.2 pg (ref 27.2–33.4)
MCHC: 33.8 g/dL (ref 32.0–36.0)
MONO#: 0.2 10*3/uL (ref 0.1–0.9)
NEUT%: 80.9 % — ABNORMAL HIGH (ref 39.0–75.0)
Platelets: 123 10*3/uL — ABNORMAL LOW (ref 140–400)
WBC: 3.5 10*3/uL — ABNORMAL LOW (ref 4.0–10.3)
lymph#: 0.3 10*3/uL — ABNORMAL LOW (ref 0.9–3.3)

## 2011-09-23 MED ORDER — MELPHALAN 2 MG PO TABS: 5.0000 mg/m2 | ORAL_TABLET | Freq: Every day | ORAL | Status: DC

## 2011-09-23 MED ORDER — ONDANSETRON HCL 8 MG PO TABS: 8.0000 mg | ORAL_TABLET | Freq: Once | ORAL | Status: DC

## 2011-09-23 MED ORDER — BORTEZOMIB CHEMO SQ INJECTION 3.5 MG (2.5MG/ML)
1.0000 mg/m2 | Freq: Once | INTRAMUSCULAR | Status: AC
  Administered 2011-09-23: 2.25 mg via SUBCUTANEOUS
  Filled 2011-09-23: qty 2.25

## 2011-09-23 NOTE — Progress Notes (Signed)
Patient requested to move his 11/30 visit to 12/7, but made him aware MD not in office on 12/7.

## 2011-09-27 LAB — COMPREHENSIVE METABOLIC PANEL
ALT: 22 U/L (ref 0–53)
AST: 19 U/L (ref 0–37)
Alkaline Phosphatase: 65 U/L (ref 39–117)
CO2: 26 mEq/L (ref 19–32)
Sodium: 141 mEq/L (ref 135–145)
Total Bilirubin: 0.3 mg/dL (ref 0.3–1.2)
Total Protein: 6.7 g/dL (ref 6.0–8.3)

## 2011-09-27 LAB — LACTATE DEHYDROGENASE: LDH: 83 U/L — ABNORMAL LOW (ref 94–250)

## 2011-09-30 ENCOUNTER — Other Ambulatory Visit: Payer: Self-pay | Admitting: Oncology

## 2011-09-30 ENCOUNTER — Other Ambulatory Visit: Payer: Medicare Other

## 2011-09-30 ENCOUNTER — Ambulatory Visit (HOSPITAL_BASED_OUTPATIENT_CLINIC_OR_DEPARTMENT_OTHER): Payer: Medicare Other

## 2011-09-30 DIAGNOSIS — Z5112 Encounter for antineoplastic immunotherapy: Secondary | ICD-10-CM

## 2011-09-30 DIAGNOSIS — N08 Glomerular disorders in diseases classified elsewhere: Secondary | ICD-10-CM

## 2011-09-30 DIAGNOSIS — C9000 Multiple myeloma not having achieved remission: Secondary | ICD-10-CM

## 2011-09-30 LAB — CBC WITH DIFFERENTIAL/PLATELET
BASO%: 0 % (ref 0.0–2.0)
EOS%: 2.3 % (ref 0.0–7.0)
HCT: 28.2 % — ABNORMAL LOW (ref 38.4–49.9)
LYMPH%: 8 % — ABNORMAL LOW (ref 14.0–49.0)
MCH: 31.7 pg (ref 27.2–33.4)
MCHC: 33.7 g/dL (ref 32.0–36.0)
NEUT%: 79.8 % — ABNORMAL HIGH (ref 39.0–75.0)
Platelets: 91 10*3/uL — ABNORMAL LOW (ref 140–400)

## 2011-09-30 MED ORDER — ONDANSETRON HCL 8 MG PO TABS: 8.0000 mg | ORAL_TABLET | Freq: Once | ORAL | Status: DC

## 2011-09-30 MED ORDER — BORTEZOMIB CHEMO SQ INJECTION 3.5 MG (2.5MG/ML)
1.0000 mg/m2 | Freq: Once | INTRAMUSCULAR | Status: AC
  Administered 2011-09-30: 2.25 mg via SUBCUTANEOUS
  Filled 2011-09-30: qty 2.25

## 2011-10-04 ENCOUNTER — Ambulatory Visit: Payer: Medicare Other | Admitting: Oncology

## 2011-10-04 ENCOUNTER — Encounter: Payer: Self-pay | Admitting: *Deleted

## 2011-10-04 ENCOUNTER — Inpatient Hospital Stay (HOSPITAL_COMMUNITY)
Admission: AD | Admit: 2011-10-04 | Discharge: 2011-10-06 | DRG: 866 | Disposition: A | Discharge status: Home / Self Care | Payer: Medicare Other | Source: Ambulatory Visit | Attending: Oncology | Admitting: Oncology

## 2011-10-04 ENCOUNTER — Other Ambulatory Visit: Payer: Self-pay | Admitting: *Deleted

## 2011-10-04 ENCOUNTER — Encounter (HOSPITAL_COMMUNITY): Payer: Self-pay | Admitting: Oncology

## 2011-10-04 ENCOUNTER — Telehealth: Payer: Self-pay | Admitting: *Deleted

## 2011-10-04 ENCOUNTER — Inpatient Hospital Stay (HOSPITAL_COMMUNITY): Payer: Medicare Other

## 2011-10-04 ENCOUNTER — Encounter: Payer: Self-pay | Admitting: Oncology

## 2011-10-04 ENCOUNTER — Other Ambulatory Visit: Payer: Self-pay | Admitting: Oncology

## 2011-10-04 DIAGNOSIS — R609 Edema, unspecified: Secondary | ICD-10-CM | POA: Diagnosis present

## 2011-10-04 DIAGNOSIS — E119 Type 2 diabetes mellitus without complications: Secondary | ICD-10-CM | POA: Diagnosis present

## 2011-10-04 DIAGNOSIS — N049 Nephrotic syndrome with unspecified morphologic changes: Secondary | ICD-10-CM | POA: Diagnosis present

## 2011-10-04 DIAGNOSIS — M7989 Other specified soft tissue disorders: Secondary | ICD-10-CM

## 2011-10-04 DIAGNOSIS — E1321 Other specified diabetes mellitus with diabetic nephropathy: Secondary | ICD-10-CM

## 2011-10-04 DIAGNOSIS — C9002 Multiple myeloma in relapse: Secondary | ICD-10-CM

## 2011-10-04 DIAGNOSIS — N189 Chronic kidney disease, unspecified: Secondary | ICD-10-CM | POA: Diagnosis present

## 2011-10-04 DIAGNOSIS — B9789 Other viral agents as the cause of diseases classified elsewhere: Principal | ICD-10-CM | POA: Diagnosis present

## 2011-10-04 DIAGNOSIS — E118 Type 2 diabetes mellitus with unspecified complications: Secondary | ICD-10-CM

## 2011-10-04 DIAGNOSIS — E0842 Diabetes mellitus due to underlying condition with diabetic polyneuropathy: Secondary | ICD-10-CM

## 2011-10-04 DIAGNOSIS — C9000 Multiple myeloma not having achieved remission: Secondary | ICD-10-CM | POA: Diagnosis present

## 2011-10-04 DIAGNOSIS — R509 Fever, unspecified: Secondary | ICD-10-CM

## 2011-10-04 DIAGNOSIS — N08 Glomerular disorders in diseases classified elsewhere: Secondary | ICD-10-CM

## 2011-10-04 DIAGNOSIS — N048 Nephrotic syndrome with other morphologic changes: Secondary | ICD-10-CM

## 2011-10-04 DIAGNOSIS — I10 Essential (primary) hypertension: Secondary | ICD-10-CM

## 2011-10-04 DIAGNOSIS — N289 Disorder of kidney and ureter, unspecified: Secondary | ICD-10-CM

## 2011-10-04 DIAGNOSIS — M109 Gout, unspecified: Secondary | ICD-10-CM

## 2011-10-04 DIAGNOSIS — I129 Hypertensive chronic kidney disease with stage 1 through stage 4 chronic kidney disease, or unspecified chronic kidney disease: Secondary | ICD-10-CM | POA: Diagnosis present

## 2011-10-04 HISTORY — DX: Multiple myeloma in relapse: C90.02

## 2011-10-04 HISTORY — DX: Fever, unspecified: R50.9

## 2011-10-04 HISTORY — DX: Chronic kidney disease, stage 3 unspecified: N18.30

## 2011-10-04 HISTORY — DX: Cutaneous abscess of limb, unspecified: L02.419

## 2011-10-04 HISTORY — DX: Cellulitis of unspecified part of limb: L03.119

## 2011-10-04 HISTORY — DX: Chronic kidney disease, stage 3 (moderate): N18.3

## 2011-10-04 HISTORY — DX: Ventral hernia without obstruction or gangrene: K43.9

## 2011-10-04 LAB — DIFFERENTIAL
Basophils Absolute: 0 10*3/uL (ref 0.0–0.1)
Basophils Relative: 0 % (ref 0–1)
Eosinophils Relative: 0 % (ref 0–5)
Lymphs Abs: 0.2 10*3/uL — ABNORMAL LOW (ref 0.7–4.0)
Monocytes Absolute: 0.9 10*3/uL (ref 0.1–1.0)
Neutro Abs: 4.1 10*3/uL (ref 1.7–7.7)

## 2011-10-04 LAB — URINE MICROSCOPIC-ADD ON

## 2011-10-04 LAB — COMPREHENSIVE METABOLIC PANEL
ALT: 26 U/L (ref 0–53)
AST: 22 U/L (ref 0–37)
Alkaline Phosphatase: 68 U/L (ref 39–117)
GFR calc Af Amer: 40 mL/min — ABNORMAL LOW (ref >90)
Glucose, Bld: 181 mg/dL — ABNORMAL HIGH (ref 70–99)
Potassium: 3.9 mEq/L (ref 3.5–5.1)
Sodium: 134 mEq/L — ABNORMAL LOW (ref 135–145)
Total Protein: 7.5 g/dL (ref 6.0–8.3)

## 2011-10-04 LAB — CBC
Hemoglobin: 9.7 g/dL — ABNORMAL LOW (ref 13.0–17.0)
MCHC: 33.8 g/dL (ref 30.0–36.0)
RBC: 3.03 MIL/uL — ABNORMAL LOW (ref 4.22–5.81)

## 2011-10-04 LAB — UIFE/LIGHT CHAINS/TP QN, 24-HR UR
Albumin, U: DETECTED
Alpha 1, Urine: DETECTED — AB
Alpha 2, Urine: DETECTED — AB
Free Kappa Lt Chains,Ur: 6.91 mg/dL — ABNORMAL HIGH (ref 0.14–2.42)
Free Kappa/Lambda Ratio: 12.8 ratio — ABNORMAL HIGH (ref 2.04–10.37)
Total Protein, Urine: 79.3 mg/dL

## 2011-10-04 LAB — URINALYSIS, ROUTINE W REFLEX MICROSCOPIC
Leukocytes, UA: NEGATIVE
Nitrite: NEGATIVE
Specific Gravity, Urine: 1.016 (ref 1.005–1.030)
Urobilinogen, UA: 0.2 mg/dL (ref 0.0–1.0)

## 2011-10-04 LAB — HEMOGLOBIN A1C: Hgb A1c MFr Bld: 7.5 % — ABNORMAL HIGH (ref <5.7)

## 2011-10-04 LAB — GLUCOSE, CAPILLARY

## 2011-10-04 MED ORDER — DOCUSATE SODIUM 100 MG PO CAPS
100.0000 mg | ORAL_CAPSULE | Freq: Two times a day (BID) | ORAL | Status: DC
  Administered 2011-10-04 – 2011-10-06 (×4): 100 mg via ORAL
  Filled 2011-10-04 (×5): qty 1

## 2011-10-04 MED ORDER — PROMETHAZINE HCL 25 MG PO TABS: 25.0000 mg | ORAL_TABLET | Freq: Four times a day (QID) | ORAL | Status: DC | PRN

## 2011-10-04 MED ORDER — DOXAZOSIN MESYLATE 8 MG PO TABS
8.0000 mg | ORAL_TABLET | Freq: Every day | ORAL | Status: DC
  Administered 2011-10-04 – 2011-10-05 (×2): 8 mg via ORAL
  Filled 2011-10-04 (×3): qty 1

## 2011-10-04 MED ORDER — SODIUM CHLORIDE 0.9 % IV SOLN
INTRAVENOUS | Status: DC
  Administered 2011-10-04 – 2011-10-05 (×2): via INTRAVENOUS

## 2011-10-04 MED ORDER — ACETAMINOPHEN 650 MG RE SUPP: 650.0000 mg | Freq: Four times a day (QID) | RECTAL | Status: DC | PRN

## 2011-10-04 MED ORDER — ONDANSETRON HCL 8 MG PO TABS: 8.0000 mg | ORAL_TABLET | Freq: Three times a day (TID) | ORAL | Status: DC | PRN

## 2011-10-04 MED ORDER — PROMETHAZINE HCL 25 MG/ML IJ SOLN: 12.5000 mg | Freq: Four times a day (QID) | INTRAMUSCULAR | Status: DC | PRN

## 2011-10-04 MED ORDER — ASPIRIN EC 81 MG PO TBEC
81.0000 mg | DELAYED_RELEASE_TABLET | Freq: Every day | ORAL | Status: DC
  Administered 2011-10-04 – 2011-10-06 (×3): 81 mg via ORAL
  Filled 2011-10-04 (×3): qty 1

## 2011-10-04 MED ORDER — ALUM & MAG HYDROXIDE-SIMETH 200-200-20 MG/5ML PO SUSP
30.0000 mL | Freq: Four times a day (QID) | ORAL | Status: DC | PRN
Start: 2011-10-04 — End: 2011-10-06

## 2011-10-04 MED ORDER — INSULIN GLARGINE 100 UNIT/ML ~~LOC~~ SOLN
50.0000 [IU] | Freq: Every day | SUBCUTANEOUS | Status: DC
  Administered 2011-10-04 – 2011-10-06 (×3): 50 [IU] via SUBCUTANEOUS
  Filled 2011-10-04: qty 3

## 2011-10-04 MED ORDER — ONDANSETRON 8 MG/NS 50 ML IVPB
8.0000 mg | Freq: Three times a day (TID) | INTRAVENOUS | Status: DC | PRN
  Filled 2011-10-04: qty 8

## 2011-10-04 MED ORDER — ACYCLOVIR 400 MG PO TABS
400.0000 mg | ORAL_TABLET | Freq: Two times a day (BID) | ORAL | Status: DC
  Administered 2011-10-04 – 2011-10-06 (×4): 400 mg via ORAL
  Filled 2011-10-04 (×5): qty 1

## 2011-10-04 MED ORDER — METOPROLOL TARTRATE 50 MG PO TABS
50.0000 mg | ORAL_TABLET | Freq: Two times a day (BID) | ORAL | Status: DC
  Administered 2011-10-04 – 2011-10-06 (×4): 50 mg via ORAL
  Filled 2011-10-04 (×5): qty 1

## 2011-10-04 MED ORDER — ENOXAPARIN SODIUM 30 MG/0.3ML ~~LOC~~ SOLN
30.0000 mg | SUBCUTANEOUS | Status: DC
  Administered 2011-10-04: 30 mg via SUBCUTANEOUS
  Filled 2011-10-04 (×3): qty 0.3

## 2011-10-04 MED ORDER — LISINOPRIL 10 MG PO TABS
10.0000 mg | ORAL_TABLET | Freq: Every day | ORAL | Status: DC
  Administered 2011-10-05 – 2011-10-06 (×2): 10 mg via ORAL
  Filled 2011-10-04 (×3): qty 1

## 2011-10-04 MED ORDER — ACETAMINOPHEN 325 MG PO TABS
650.0000 mg | ORAL_TABLET | Freq: Four times a day (QID) | ORAL | Status: DC | PRN
  Administered 2011-10-04 – 2011-10-06 (×2): 650 mg via ORAL
  Filled 2011-10-04 (×2): qty 2

## 2011-10-04 MED ORDER — OSELTAMIVIR PHOSPHATE 75 MG PO CAPS
75.0000 mg | ORAL_CAPSULE | Freq: Two times a day (BID) | ORAL | Status: DC
  Administered 2011-10-04 – 2011-10-06 (×4): 75 mg via ORAL
  Filled 2011-10-04 (×6): qty 1

## 2011-10-04 MED ORDER — ALLOPURINOL 300 MG PO TABS
300.0000 mg | ORAL_TABLET | Freq: Every day | ORAL | Status: DC
Start: 2011-10-04 — End: 2011-10-06
  Administered 2011-10-05 – 2011-10-06 (×2): 300 mg via ORAL
  Filled 2011-10-04 (×3): qty 1

## 2011-10-04 MED ORDER — FUROSEMIDE 40 MG PO TABS
40.0000 mg | ORAL_TABLET | Freq: Two times a day (BID) | ORAL | Status: DC
  Administered 2011-10-04 – 2011-10-06 (×4): 40 mg via ORAL
  Filled 2011-10-04 (×5): qty 1

## 2011-10-04 MED ORDER — INSULIN ASPART 100 UNIT/ML ~~LOC~~ SOLN
0.0000 [IU] | Freq: Three times a day (TID) | SUBCUTANEOUS | Status: DC
  Administered 2011-10-05 (×2): 3 [IU] via SUBCUTANEOUS
  Administered 2011-10-06: 7 [IU] via SUBCUTANEOUS
  Filled 2011-10-04: qty 3

## 2011-10-04 MED ORDER — POTASSIUM CHLORIDE CRYS ER 20 MEQ PO TBCR
20.0000 meq | EXTENDED_RELEASE_TABLET | Freq: Every day | ORAL | Status: DC
  Administered 2011-10-04 – 2011-10-06 (×3): 20 meq via ORAL
  Filled 2011-10-04 (×3): qty 1

## 2011-10-04 MED ORDER — HYDROCODONE-ACETAMINOPHEN 5-325 MG PO TABS: 1.0000 | ORAL_TABLET | ORAL | Status: DC | PRN

## 2011-10-04 MED ORDER — GLIPIZIDE 5 MG PO TABS
5.0000 mg | ORAL_TABLET | Freq: Every day | ORAL | Status: DC
  Administered 2011-10-05 – 2011-10-06 (×2): 5 mg via ORAL
  Filled 2011-10-04 (×3): qty 1

## 2011-10-04 MED ORDER — TRAZODONE 25 MG HALF TABLET
25.0000 mg | ORAL_TABLET | Freq: Every evening | ORAL | Status: DC | PRN
  Filled 2011-10-04: qty 1

## 2011-10-04 MED ORDER — INSULIN GLARGINE 100 UNIT/ML ~~LOC~~ SOLN
15.0000 [IU] | Freq: Every day | SUBCUTANEOUS | Status: DC
  Filled 2011-10-04: qty 3

## 2011-10-04 NOTE — Progress Notes (Signed)
Pt. Arrived @ 5pm & per Dr. Cyndie Chime sent to admitting.  Pt. has a bed on 3rd floor, 1335

## 2011-10-04 NOTE — Telephone Encounter (Signed)
Received call from wife stating pt has a fever of 102.6 & she can't wake him good.  He does arouse & talk to her but goes back to sleep.  He has a head cold.  Notified Dr. Cyndie Chime & wife was told to bring pt to office for admit.  Called [facility]/Admitting/Kim & set up admission for today.  Notified [facility].

## 2011-10-04 NOTE — H&P (Signed)
CC: William Gaines, M.D. William Spice, MD A. 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  Due to recent significant rise in his urine protein level to over 6 gm, and development of a new asymptomatic bone lesion that was in his hip that was treated with radiation, I put him back on a chemotherapy program beginning in August with Velcade 1.5 mg/sq m subcutaneous, 3 weeks on, 1 week rest, and oral melphalan (Alkeran) initially 7 mg/sq m 4 days each month.  I did not give him any steroids due to his poorly controlled diabetes.  Problems with steroids in the past.   He is tolerating the program well.  I had to make a dose adjustment down to 5 mg/sq m on the Alkeran going into the second cycle which he started on 07/22/2011 due to nadir counts that were unacceptably low after the first cycle with white count 2500, neutrophils 1800, and platelets 50,000.  Counts did better with this cycle.  He is now out about 3 weeks, and total white count is 3200, hemoglobin 10.4, platelets 123,000.   At the time of the most recent visit, he had gained a significant amount of peripheral edema.  Weight up from 268 to 275.  I doubled up on his Lasix to 40 mg twice a day and told him to get in touch with his nephrologist.  She did not make any additional changes.  Weight is back down to 268 again today, but legs still significantly swollen.     Despite the leg swelling, follow-up 24-hour urine protein has fallen by more than 50% and was 2465 mg on 09/24 compared with 5822 on 03/25/2011, and 6866 on 10/25/2010.  Concomitant fall in free kappa light chains in the urine from peak value of 1830 mg% in December to 1548 in May, and current value 472.6.  Immunofixation electrophoresis shows only a "area of slightly restricted mobility in the IgG and free kappa light chain lanes."  This suggests that we have impacted on the myeloma component of his  chronic nephrotic syndrome.     He has had no interim infections.  He is on Valtrex prophylaxis while he is receiving the Velcade.  PHYSICAL EXAMINATION:  Vital Signs:  On exam, blood pressure is 154/63, pulse 80, regular, respirations 20, temperature 97.5, weight 268.  Head and neck normal.  Lungs:  Clear, resonant to percussion.  Heart:  Regular cardiac rhythm.  No murmur.  Abdomen:  Obese, umbilical hernia.  Extremities:  With 2 to 3+ edema.  Skin is starting to buckle.  Small ulcer, posterior right calf.  Neurologic:  Motor strength 5/5.  Reflexes 1+, symmetric.  He denies any paresthesias.  ADDITIONAL LABORATORY:  BUN 53, creatinine 2.2, both at his recent baseline.  Random glucose 153, potassium 4.5, bicarbonate 31.  IMPRESSION:   1. Multiple myeloma, evidence for treatment response with regimen outlined above.  Plan:  Continue the same. 2. Chronic nephrotic syndrome. 3. Significant peripheral edema secondary to number 2. 4. Essential hypertension.  5. Insulin-dependent diabetes. 6. Obesity.    ______________________________ William Gaines, M.D., F.WilliamC.P. William Gaines  D:  08/12/2011  T:  08/13/2011  Job:  [id number]   .td:   HISTORY AND PHYSICAL EXAM   70 year old man with multiple medical problems who initially came to my attention back in October of 2007 when he presented with a six-month history of rapidly progressive low back pain and left  thigh and hip pain. He was found to have diffuse bone marrow abnormality on MRI of the spine with a destructive lesion at L4. Needle biopsy showed a plasma cell infiltrate which was CD138 positive. Additional disease at L2. Bone marrow biopsy done 11 57 showed 4% plasma cells overall but there were focal clusters of abnormal plasma cells throughout the specimen. Laboratory studies showed initially no anemia with a hemoglobin of 14.6, mild renal insufficiency with creatinine 1.5, borderline calcium of 10.2 with albumin 3.5, slight elevation of serum  IgG at 1980 mg percent and IgA at 455 mg percent with free kappa light chains on immunofixation electrophoresis. Serum free kappa light chains elevated at 10.6 mg percent kappa lambda ratio elevated at 3.4 normal up to 1.65. 24-hour urine showed 1.6 g of protein 410 mg percent free kappa light chains. He had known chronic renal insufficiency and previously diagnosed nephrotic syndrome. He had long-standing type 2 diabetes. Urine studies have been difficult to interpret due to this.  Diagnosis of kappa light chain versus non-secretory multiple myeloma was made. He was initially treated with palliative radiation 20 gray 10 fractions L1-L5 12/3 through 10/1406. He was started on 8 immunotherapy program with steroids plus Revlimid. His condition stabilized. He went on to receive surgery by Dr. Glee Gaines in April 2008. He underwent an L2 vertebrectomy with reconstruction and L4-5 decompressive laminectomy with a screw plate fixation and bone graft.  His condition stabilized significantly. Free light chains decreased. Urine protein decreased. The steroid component of his program had to be stopped due to uncontrolled diabetes. Initial treatment was continued through July of 2009. In addition to the Revlimid Decadron he received monthly Zometa infusions. He was watched closely until February of this year when a restaging evaluation was done. Although bone marrow biopsy done on 12/09/10 showed only 4% plasma cells with no monoclonal population by CD138 testing, a metastatic bone survey showed a large asymptomatic lesion in the left iliac bone. Urine protein jump to 6 g. He was referred back to radiation oncology and received another 20 grays in 10 fractions to the left hip lesion between 3/8 and 01/2111. He was put back on a chemotherapy program with oral melphalan initial dose 7 mg per meter squared 4 days each month plus Velcade 1.5 mg per meter squared subcutaneous injection weekly 3 weeks on one week rest. He is tolerated  the program well. 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 He has again showed stabilization of his myeloma. A 24-hour urine collection done last week showed protein levels down to his baseline of 2 g. Serum immunofixation electrophoresis on 10/19 showed no monoclonal protein. Kappa free light chain analysis done 11/16 showed a significant fall from peak value of 61.2 mg percent on 05/27/11 down to 10.3 with lambda free light chains 4.96 kappa lambda ratio 2.09. Last Velcade treatment was given 11/23. Of note he has not had any significant neutropenia on this regimen. On 11/23 white count 4880% neutrophils hemoglobin 9.5 platelets 91,000.  His wife called our office late this afternoon to report he had a fever of 102.6. One of their grandchildren had a viral illness last week. The wife contracted the same viral illness and has been sick for 1 week. The patient has been coughing for the last 3 days. Intermittent green sputum production. Intermittent mild headache. Intermittent nausea but no vomiting. Loose stools yesterday. No significant myalgias or arthralgias. Mild sore throat.  In view of  the high fever and his immunocompromised status he is admitted at this time for further evaluation          Past Medical History  Diagnosis Date  . Diabetes mellitus   . Hypertension   . Multiple myeloma, in relapse   . Nephrotic syndrome in diabetes mellitus   . Fever, unspecified 10/04/2011  . Multiple myeloma, in relapse 10/04/2011   No current facility-administered medications on file prior to encounter.   No current outpatient prescriptions on file prior to encounter.    No family history on file. History   Social History  . Marital Status: Married    Spouse Name: N/A    Number of Children: N/A  . Years of Education: N/A   Occupational History  . Not on file.   Social History Main Topics  . Smoking status: Not on file  . Smokeless  tobacco: Not on file  . Alcohol Use: No  . Drug Use:   . Sexually Active:    Other Topics Concern  . Not on file   Social History Narrative  . No narrative on file    ROS: Eyes: Normal no change in vision, no double vision, no blurred vision Throat: mild sore throat last 24 hours Neck:Normal no stiff neck Resp: Normal no dyspnea at rest, chronic dyspnea on exertion, no paroxysmal nocturnal dyspnea, no orthopnea, + cough x 3 days with green sputum production, no hemoptysis. Cardio: No chest pain, chest pressure, palpitations GI:  no abdominal pain, no dysphagia, no nausea, no vomiting, no diarrhea; loose BM yesterday; no hematochezia or melena Extremities: sginificant increase in chronic  Swelling of both legs but more pronounced swelling of the right leg over the last week. Lymph nodes: Normal No swollen glands Neurologic: Mild headache, no change in vision, no focal weakness, no slurred speech, no numbness or tingling Skin:  No rashes or lesion. No bruising. Genitourinary: no dysuria, frequency, hematuria. Nocturia x 3 chronic  PHYSICAL EXAM: General appearance:Normal alert, oriented, obese Head: Normal atraumatic Eyes: Normal pupils equal, round, reactive to light Throat: Normal no erythema, exudate, or ulcer  Neck: Normal  No thyromegaly or thyroid mass; carotids 1+ Resp: Normal clear to auscultation, resonant to percussion Cardio: Normal Regular rhythm, no murmur, gallop, or rub GI: Normal soft, non-tender, normal bowel sounds, no mass or organomegaly; ombelical hernia. Extremities: chronic bilateral 2+ edema; new 3+  Edema and firmness right calf Vascular: venous stasis changes; chronic brawny edema Lymph nodes: no cervical, supraclavicular, or axillary adenopathy Neurologic:Normal mental status intact, cranial nerves intact, formal vision and hearing testing not done. Motor strength 5/5 all extremities; reflexes 2+ symmetric upper extrems; absent symmetric lower extrems  upper body coordination normal; gait not tested; sensation intact.   Skin: color, texture, turgor normal. No rashes or lesions ; no ecchymoses or petecchia    Impression: #1. Likely  viral illness. He already had the flu vaccine this season. However, in view of his immunocompromised status, I will treat with a 1 week course of Tamiflu. Obtain cultures for both virus some bacteria. Chest x-ray. Hydration.  #2. Kappa light chain myeloma in first relapse. Responding to recent treatment with oral melphalan plus subcutaneous Velcade. I will hold all chemotherapy treatments until infection is under control.  #3. Insulin-dependent diabetes mellitus. I will continue his home insulin. Monitor  blood sugars and use when necessary  regular insulin.  #4. Chronic renal insufficiency multifactorial diabetes mellitus, hypertension, pre-existing nephrotic syndrome, and multiple myeloma.  #5. Gout. Continue allopurinol.  #  6. Asymmetric swelling right calf. Rule out DVT. Venous Doppler study.  #7. Obesity.   William Gaines  10/04/11 5:12 PM

## 2011-10-05 DIAGNOSIS — B9789 Other viral agents as the cause of diseases classified elsewhere: Principal | ICD-10-CM

## 2011-10-05 DIAGNOSIS — M7989 Other specified soft tissue disorders: Secondary | ICD-10-CM

## 2011-10-05 LAB — BASIC METABOLIC PANEL
BUN: 36 mg/dL — ABNORMAL HIGH (ref 6–23)
GFR calc Af Amer: 33 mL/min — ABNORMAL LOW (ref >90)
GFR calc non Af Amer: 28 mL/min — ABNORMAL LOW (ref >90)
Potassium: 3.8 mEq/L (ref 3.5–5.1)
Sodium: 134 mEq/L — ABNORMAL LOW (ref 135–145)

## 2011-10-05 LAB — GLUCOSE, CAPILLARY
Glucose-Capillary: 72 mg/dL (ref 70–99)
Glucose-Capillary: 185 mg/dL — ABNORMAL HIGH (ref 70–99)

## 2011-10-05 NOTE — Progress Notes (Signed)
*  PRELIMINARY RESULTS*  Right leg venous duplex has been performed.  Right leg negative for DVT.  [name] 10/05/2011, 10:08 AM

## 2011-10-05 NOTE — Progress Notes (Signed)
Progress Note:  Subjective:  Temperature 101 on admission but has come down overnight. Currently afebrile 98.4. He has no new complaints. Baseline laboratory studies were remarkably good considering his underlying diabetes and chronic renal insufficiency. Glucose 243 creatinine 2.2. CBC with a hemoglobin of 9.7 which is his baseline white count 5200 with 78% neutrophils platelet count 67,000 reflecting recent chemotherapy.  Vitals: Filed Vitals:   10/05/11 0516  BP: 123/72  Pulse: 99  Temp: 98.4 F (36.9 C)  Resp: 21    PHYSICAL EXAM:  General alert and oriented Head: normal Eyes: It was equal and reactive  Throat: There is no erythema but no exudate Neck: Supple Resp: Scattered wheezes  Breasts:  Cardio: Regular cardiac rhythm GI: Distended soft nontender, ombelical hernia Extremities: Edema and venous stasis changes and now asymmetric swelling right calf but no tenderness Vascular:  Lymph nodes:  Neurologic no focal deficit Skin:   Labs:   Basename 10/04/11 1750  WBC 5.2  HGB 9.7*  HCT 28.7*  PLT 67*    Basename 10/05/11 0345 10/04/11 1750  NA 134* 134*  K 3.8 3.9  CL 96 95*  CO2 26 27  GLUCOSE 113* 181*  BUN 36* 34*  CREATININE 2.21* 1.87*  CALCIUM 9.0 9.4      Images Studies/Results:   X-ray Chest Pa And Lateral   10/04/2011  *RADIOLOGY REPORT*  Clinical Data: Fever, weakness, myeloma.  CHEST - 2 VIEW  Comparison: None  Findings: The heart is normal in size.  The mediastinal and hilar contours are within normal limits.  Low lung volumes with vascular crowding and atelectasis.  Could not exclude a developing lingular infiltrate.  No pleural effusion or pneumothorax.  The bony thorax is intact.  IMPRESSION: Low lung volumes with vascular crowding and bibasilar atelectasis, left greater than right.  Cannot exclude a developing left lingular infiltrate.  Original Report Authenticated By: P. Loralie Champagne, M.D.     Patient Active Problem List    Diagnoses  . Multiple myeloma without mention of remission  . Myeloma kidney  . Diabetes mellitus due to underlying condition with diabetic polyneuropathy  . Hypertension associated with diabetes  . Nephrotic syndrome NEC  . Fever, unspecified  . Multiple myeloma, in relapse  . Diabetes mellitus type 2 with complications  . Gout    Assessment and Plan:  #1. Acute viral illness. Plan continue Tamiflu.gentle hydration 2. Multiple myeloma in first relapse holding chemotherapy until infection resolves. 3. Insulin-dependent diabetes. You current insulin regimen. #4. Multifactorial chronic renal insufficiency diabetes, hypertension, nephrotic syndrome, and multiple myeloma. #5. Rule out right lower extremity deep venous thrombosis. Venous Doppler study today.Marland Kitchen    William Gaines M 10/05/2011, 5:07 PM

## 2011-10-06 ENCOUNTER — Telehealth: Payer: Self-pay | Admitting: Oncology

## 2011-10-06 ENCOUNTER — Other Ambulatory Visit: Payer: Self-pay | Admitting: Oncology

## 2011-10-06 DIAGNOSIS — C9002 Multiple myeloma in relapse: Secondary | ICD-10-CM

## 2011-10-06 LAB — INFLUENZA VIRUS AG, A+B (DFA)

## 2011-10-06 MED ORDER — OSELTAMIVIR PHOSPHATE 75 MG PO CAPS: 75.0000 mg | ORAL_CAPSULE | Freq: Two times a day (BID) | ORAL | Status: AC

## 2011-10-06 NOTE — Progress Notes (Signed)
DC to home. To car by wc. No change from am assessment.

## 2011-10-06 NOTE — Discharge Summary (Signed)
Physician Discharge Summary  Patient ID: William Gaines @ATTENDINGNPI @ MRN: [id number] DOB/AGE: [birth year] 70 y.o.  Admit date: 10/04/2011 Discharge date: 10/06/2011  Admission Diagnoses: Fever-likely viral illness Hospital Course:  Complicated 70 year old man with multiple medical problems including hypertension, insulin-dependent diabetes, a chronic nephrotic syndrome , chronic renal insufficiency, kappa light chain multiple myeloma initially diagnosed in October 2007 when he presented with a pathologic fractures of his spine. Please see history and physical for full details of diagnosis and treatment. His current chemotherapy program includes oral Alkeran and subcutaneous Velcade. He is on prophylactic Valtrex in view of increased incidence of herpes infection seen patients on Velcade.  He presented on the day of the current admission with sudden onset of high fever to 102.6. There was a viral illness circulating in his family. Initial contact was a grandchild then his wife contracted the illness and then the patient. He had a mild sore throat, intermittent mild headache, cough intermittently productive of sputum for about 72 hours prior to the fever. No stiff neck. No myalgias or arthralgias. Due to his immunocompromised status he was admitted for further evaluation and treatment.  He was started on gentle hydration. Cultures were obtained for both bacteria and virus. Blood cultures are negative at 72 hours. Viral studies for influenza A and B. are still outstanding. A urine culture is still being processed. He was started nontender flu oral antiviral agent in addition to the Valtrex. No antibacterial antibiotics were administered. His fever resolved very rapidly with observation alone and oral Tamiflu. Of note the initial white blood count was normal. Platelet count was decreased due to recent chemotherapy.  Additional problems identified on admission included increased asymmetric brawny  edema of the right lower extremity. He has chronic edema due to  nephrotic syndrome. Urine total protein was up to 6 cramps prior to initiating recent chemotherapy program but has come down to 2 g. His diuretic therapy was recently intensified and he is now on Lasix 40 mg twice daily. Due to the discrepancy in the edema right greater than left, I obtained a venous Doppler study which was negative for acute or chronic thrombosis. He was given routine thromboprophylaxis during the admission.  Blood sugars were monitored. He takes Lantus insulin 50 units daily at home before supper every day. This was continued. He received PR and short acting insulin to cover blood sugars during the day.  His condition stabilized rapidly and he was stable for discharge today.  Discharge Labs:   Consults: None  Procedures: Venous Doppler study right lower extremity negative.   Discharge Diagnoses:  #1. Fever due to acute viral illness.  #2. Acute viral illness.  #3. Asymmetric leg edema. #4. Nephrotic syndrome. #5. Relapsed kappa light chain multiple myeloma. #6. Insulin-dependent diabetes. #7. Essential hypertension. #8. Gout  Disposition:  Discharged Condition: stable  discharge activity: Resume regular activity.  PLAN; DISCHARGE DIET continue diabetic diet.  Follow-up Information    Follow up with Levert Feinstein, MD. Call in 2 weeks.   Contact information:   [address] [place] [address] [phone]          Current Discharge Medication List    START taking these medications   Details  oseltamivir (TAMIFLU) 75 MG capsule Take 1 capsule (75 mg total) by mouth 2 (two) times daily. Qty: 10 capsule, Refills: 0      CONTINUE these medications which have NOT CHANGED   Details  acyclovir (ZOVIRAX) 400 MG tablet Take 400 mg by mouth  2 (two) times daily.      allopurinol (ZYLOPRIM) 300 MG tablet Take 300 mg by mouth daily.      doxazosin (CARDURA) 8 MG tablet  Take 8 mg by mouth at bedtime.      furosemide (LASIX) 40 MG tablet Take 40 mg by mouth 2 (two) times daily.      glipiZIDE (GLUCOTROL) 5 MG tablet Take 5 mg by mouth daily.      insulin glargine (LANTUS) 100 UNIT/ML injection Inject 50 Units into the skin daily.      lisinopril (PRINIVIL,ZESTRIL) 10 MG tablet Take 10 mg by mouth daily.      metoprolol (LOPRESSOR) 50 MG tablet Take 50 mg by mouth 2 (two) times daily.      ondansetron (ZOFRAN) 8 MG tablet Take 8 mg by mouth every 8 (eight) hours as needed. For nausea before chemo.     potassium chloride SA (K-DUR,KLOR-CON) 20 MEQ tablet Take 20 mEq by mouth daily.          Discharge Orders    Future Appointments: Provider: Department: Dept Phone: Center:   10/07/2011 1:00 PM Leighton Ruff Womack [facility] [phone] None   10/07/2011 2:30 PM Levert Feinstein, MD [facility] [phone] None   10/07/2011 3:15 PM [facility] [phone] None     Future Orders Please Complete By Expires   Diet - low sodium heart healthy      Increase activity slowly             Signed: Levert Feinstein 10/06/2011, 10:16 AM

## 2011-10-07 ENCOUNTER — Ambulatory Visit: Payer: Medicare Other

## 2011-10-07 ENCOUNTER — Other Ambulatory Visit: Payer: Medicare Other | Admitting: Lab

## 2011-10-07 ENCOUNTER — Ambulatory Visit: Payer: Medicare Other | Admitting: Oncology

## 2011-10-07 ENCOUNTER — Telehealth: Payer: Self-pay

## 2011-10-07 LAB — URINE CULTURE: Culture  Setup Time: 201211280145

## 2011-10-07 MED ORDER — SULFAMETHOXAZOLE-TRIMETHOPRIM 800-160 MG PO TABS: 1.0000 | ORAL_TABLET | Freq: Two times a day (BID) | ORAL | Status: AC

## 2011-10-07 NOTE — Telephone Encounter (Signed)
4-5 PM is open.  Also - Mr William Gaines only needs 30 minutes.  He needs to be seen that day and I am out of town at a meeting  DrG

## 2011-10-07 NOTE — Telephone Encounter (Signed)
Look again! Yes there is  Dr Reece Agar

## 2011-10-07 NOTE — Telephone Encounter (Signed)
Pt notified per Dr Cyndie Chime of infection noted to urine.  Script to be called to [facility] in [place] per pt request.  Message forwarded to [facility] to r/s pt's f/u appt from today to 12/7 with Misty Stanley, NP.  dph

## 2011-10-13 ENCOUNTER — Telehealth: Payer: Self-pay | Admitting: Oncology

## 2011-10-13 ENCOUNTER — Ambulatory Visit (HOSPITAL_BASED_OUTPATIENT_CLINIC_OR_DEPARTMENT_OTHER): Payer: Medicare Other | Admitting: Nurse Practitioner

## 2011-10-13 ENCOUNTER — Other Ambulatory Visit (HOSPITAL_BASED_OUTPATIENT_CLINIC_OR_DEPARTMENT_OTHER): Payer: Medicare Other | Admitting: Lab

## 2011-10-13 ENCOUNTER — Other Ambulatory Visit: Payer: Self-pay | Admitting: *Deleted

## 2011-10-13 DIAGNOSIS — C9000 Multiple myeloma not having achieved remission: Secondary | ICD-10-CM

## 2011-10-13 DIAGNOSIS — C888 Other malignant immunoproliferative diseases: Secondary | ICD-10-CM

## 2011-10-13 DIAGNOSIS — D6959 Other secondary thrombocytopenia: Secondary | ICD-10-CM

## 2011-10-13 DIAGNOSIS — C9002 Multiple myeloma in relapse: Secondary | ICD-10-CM

## 2011-10-13 DIAGNOSIS — R609 Edema, unspecified: Secondary | ICD-10-CM

## 2011-10-13 DIAGNOSIS — N049 Nephrotic syndrome with unspecified morphologic changes: Secondary | ICD-10-CM

## 2011-10-13 LAB — CBC WITH DIFFERENTIAL/PLATELET
Basophils Absolute: 0 10*3/uL (ref 0.0–0.1)
Eosinophils Absolute: 0.1 10*3/uL (ref 0.0–0.5)
HCT: 27.3 % — ABNORMAL LOW (ref 38.4–49.9)
HGB: 9.3 g/dL — ABNORMAL LOW (ref 13.0–17.1)
MCV: 97.9 fL (ref 79.3–98.0)
NEUT#: 4.8 10*3/uL (ref 1.5–6.5)
NEUT%: 79.8 % — ABNORMAL HIGH (ref 39.0–75.0)
RDW: 20.5 % — ABNORMAL HIGH (ref 11.0–14.6)
lymph#: 0.6 10*3/uL — ABNORMAL LOW (ref 0.9–3.3)

## 2011-10-13 NOTE — Telephone Encounter (Signed)
gve the pt's wife the dec-feb 2013 appt calendar °

## 2011-10-13 NOTE — Progress Notes (Signed)
OFFICE PROGRESS NOTE  Interval history:  William Gaines is a 70 year old man with multiple myeloma. He is on active treatment with Velcade 3 weeks out of 4 and melphalan 4 days each month. He began the most recent cycle on 09/16/2011.  He was hospitalized 10/04/2011 with fevers. Cultures were obtained for both bacteria and virus. The blood cultures remained negative. Viral studies for influenza A and B also negative. Urine culture returned positive for Escherichia coli and Morganella morganii. He was started on a course of Septra DS. He was discharged home on 10/06/2011.  William Gaines reports that overall he is feeling better. No further fevers. Cough is improved. He denies shortness of breath. Leg swelling is better. His wife notes that he is sleeping more. Stable back pain.   Objective: Blood pressure 92/50, pulse 74, temperature 97.6 F (36.4 C), temperature source Oral, height 5\' 6"  (1.676 m), weight 262 lb 11.2 oz (119.16 kg).  Oropharynx is without thrush or ulceration. No palpable cervical or supra-clavicular lymph nodes. Lungs are clear. No wheezes or rales. Regular cardiac rhythm. Abdomen is soft and nontender. Firm edema below the knees bilaterally right greater than left. Motor strength 5 over 5.   Lab Results: Lab Results  Component Value Date   WBC 6.0 10/13/2011   HGB 9.3* 10/13/2011   HCT 27.3* 10/13/2011   MCV 97.9 10/13/2011   PLT 103* 10/13/2011    Chemistry:      Component Value Date/Time   NA 134* 10/05/2011 0345   NA 137 07/07/2011 1217   K 3.8 10/05/2011 0345   K 4.8* 07/07/2011 1217   CL 96 10/05/2011 0345   CL 99 07/07/2011 1217   CO2 26 10/05/2011 0345   CO2 29 07/07/2011 1217   GLUCOSE 113* 10/05/2011 0345   GLUCOSE 95 07/07/2011 1217   BUN 36* 10/05/2011 0345   BUN 41* 07/07/2011 1217   CREATININE 2.21* 10/05/2011 0345   CREATININE 2.1* 07/07/2011 1217   CREATININE 1.90* 03/25/2011 1008   CREATININE 1.90* 03/25/2011 1008   CREATININE 1.90* 03/25/2011 1008   CREATININE 1.90* 03/25/2011 1008   CALCIUM 9.0 10/05/2011 0345   CALCIUM 8.7 07/07/2011 1217   PROT 7.5 10/04/2011 1750   ALBUMIN 3.3* 10/04/2011 1750   AST 22 10/04/2011 1750   ALT 26 10/04/2011 1750   ALKPHOS 68 10/04/2011 1750   BILITOT 0.3 10/04/2011 1750   GFRNONAA 28* 10/05/2011 0345   GFRAA 33* 10/05/2011 0345     Studies/Results: X-ray Chest Pa And Lateral   10/04/2011  *RADIOLOGY REPORT*  Clinical Data: Fever, weakness, myeloma.  CHEST - 2 VIEW  Comparison: None  Findings: The heart is normal in size.  The mediastinal and hilar contours are within normal limits.  Low lung volumes with vascular crowding and atelectasis.  Could not exclude a developing lingular infiltrate.  No pleural effusion or pneumothorax.  The bony thorax is intact.  IMPRESSION: Low lung volumes with vascular crowding and bibasilar atelectasis, left greater than right.  Cannot exclude a developing left lingular infiltrate.  Original Report Authenticated By: P. Loralie Champagne, M.D.    Medications: I have reviewed the patient's current medications.  Assessment/Plan:  1. 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  Followup 24-hour urine on 08/01/2011 was improved. 24-hour urine collection 09/30/2011 was further improved. 2. Thrombocytopenia secondary to chemotherapy. 3. Chronic nephrotic syndrome. 4. Significant peripheral edema secondary to chronic nephrotic syndrome. Venous Doppler of the right lower extremity was  negative during the recent hospitalization. 5. Essential hypertension. 6. Insulin-dependent diabetes. 7. Obesity. 8.     Acyclovir prophylaxis while on Velcade.   9.     Hospitalization 10/04/2011 through 10/06/2011 with probable acute viral illness. 10.   Escherichia coli and Morganella Morganii urinary tract infection currently completing a course of Septra DS.  Disposition-William Gaines appears stable. Plan to proceed  with the next cycle of Velcade and melphalan beginning 10/21/2011. He will return for a followup visit on 11/18/2011. He will contact the office in the interim with any problems.  Plan reviewed with Dr. Cyndie Chime.   Lonna Cobb ANP/GNP-BC

## 2011-10-14 ENCOUNTER — Ambulatory Visit: Payer: Medicare Other

## 2011-10-14 ENCOUNTER — Other Ambulatory Visit: Payer: Medicare Other

## 2011-10-17 ENCOUNTER — Other Ambulatory Visit: Payer: Self-pay | Admitting: *Deleted

## 2011-10-17 DIAGNOSIS — C9000 Multiple myeloma not having achieved remission: Secondary | ICD-10-CM

## 2011-10-17 DIAGNOSIS — N08 Glomerular disorders in diseases classified elsewhere: Secondary | ICD-10-CM

## 2011-10-17 MED ORDER — MELPHALAN 2 MG PO TABS: 5.0000 mg/m2 | ORAL_TABLET | Freq: Every day | ORAL | Status: AC

## 2011-10-18 NOTE — Progress Notes (Signed)
Received fax from [employer] confirming pt's Alkeran shipped on 12/10. dph

## 2011-10-21 ENCOUNTER — Ambulatory Visit (HOSPITAL_BASED_OUTPATIENT_CLINIC_OR_DEPARTMENT_OTHER): Payer: Medicare Other

## 2011-10-21 ENCOUNTER — Other Ambulatory Visit (HOSPITAL_BASED_OUTPATIENT_CLINIC_OR_DEPARTMENT_OTHER): Payer: Medicare Other | Admitting: Lab

## 2011-10-21 ENCOUNTER — Other Ambulatory Visit: Payer: Self-pay | Admitting: Oncology

## 2011-10-21 DIAGNOSIS — N08 Glomerular disorders in diseases classified elsewhere: Secondary | ICD-10-CM

## 2011-10-21 DIAGNOSIS — C9000 Multiple myeloma not having achieved remission: Secondary | ICD-10-CM

## 2011-10-21 DIAGNOSIS — C9002 Multiple myeloma in relapse: Secondary | ICD-10-CM

## 2011-10-21 DIAGNOSIS — Z5112 Encounter for antineoplastic immunotherapy: Secondary | ICD-10-CM

## 2011-10-21 LAB — CBC WITH DIFFERENTIAL/PLATELET
Basophils Absolute: 0 10*3/uL (ref 0.0–0.1)
Eosinophils Absolute: 0.1 10*3/uL (ref 0.0–0.5)
HGB: 9.7 g/dL — ABNORMAL LOW (ref 13.0–17.1)
LYMPH%: 11.3 % — ABNORMAL LOW (ref 14.0–49.0)
MCV: 98.8 fL — ABNORMAL HIGH (ref 79.3–98.0)
MONO%: 10.4 % (ref 0.0–14.0)
NEUT#: 3.2 10*3/uL (ref 1.5–6.5)
NEUT%: 74.8 % (ref 39.0–75.0)
Platelets: 128 10*3/uL — ABNORMAL LOW (ref 140–400)

## 2011-10-21 MED ORDER — BORTEZOMIB CHEMO SQ INJECTION 3.5 MG (2.5MG/ML)
1.0000 mg/m2 | Freq: Once | INTRAMUSCULAR | Status: AC
  Administered 2011-10-21: 2.25 mg via SUBCUTANEOUS
  Filled 2011-10-21: qty 2.25

## 2011-10-21 NOTE — Progress Notes (Signed)
Pt zofran, and states he took at home prior to appt.

## 2011-10-21 NOTE — Patient Instructions (Signed)
Pt knows to call if any problems and is aware of next appt.  Has anti-emetic at home.

## 2011-10-25 ENCOUNTER — Other Ambulatory Visit: Payer: Self-pay | Admitting: Certified Registered Nurse Anesthetist

## 2011-10-28 ENCOUNTER — Other Ambulatory Visit (HOSPITAL_BASED_OUTPATIENT_CLINIC_OR_DEPARTMENT_OTHER): Payer: Medicare Other | Admitting: Lab

## 2011-10-28 ENCOUNTER — Ambulatory Visit (HOSPITAL_BASED_OUTPATIENT_CLINIC_OR_DEPARTMENT_OTHER): Payer: Medicare Other

## 2011-10-28 DIAGNOSIS — Z5112 Encounter for antineoplastic immunotherapy: Secondary | ICD-10-CM

## 2011-10-28 DIAGNOSIS — C9000 Multiple myeloma not having achieved remission: Secondary | ICD-10-CM

## 2011-10-28 DIAGNOSIS — C9002 Multiple myeloma in relapse: Secondary | ICD-10-CM

## 2011-10-28 DIAGNOSIS — C888 Other malignant immunoproliferative diseases: Secondary | ICD-10-CM

## 2011-10-28 LAB — CBC WITH DIFFERENTIAL/PLATELET
BASO%: 0.1 % (ref 0.0–2.0)
LYMPH%: 9.1 % — ABNORMAL LOW (ref 14.0–49.0)
MCHC: 34.5 g/dL (ref 32.0–36.0)
MONO#: 0.3 10*3/uL (ref 0.1–0.9)
MONO%: 6.7 % (ref 0.0–14.0)
Platelets: 125 10*3/uL — ABNORMAL LOW (ref 140–400)
RBC: 2.75 10*6/uL — ABNORMAL LOW (ref 4.20–5.82)
WBC: 4.1 10*3/uL (ref 4.0–10.3)

## 2011-10-28 MED ORDER — BORTEZOMIB CHEMO SQ INJECTION 3.5 MG (2.5MG/ML)
1.0000 mg/m2 | Freq: Once | INTRAMUSCULAR | Status: AC
  Administered 2011-10-28: 2.25 mg via SUBCUTANEOUS
  Filled 2011-10-28: qty 2.25

## 2011-10-28 MED ORDER — ONDANSETRON HCL 8 MG PO TABS: 8.0000 mg | ORAL_TABLET | Freq: Once | ORAL | Status: DC

## 2011-10-28 NOTE — Patient Instructions (Signed)
10/28/11 1423-Pt discharged ambulatory with next appointment confirmed.  Pt aware to call with any questions or concerns.

## 2011-11-04 ENCOUNTER — Other Ambulatory Visit: Payer: Self-pay | Admitting: Oncology

## 2011-11-04 ENCOUNTER — Ambulatory Visit: Payer: Medicare Other

## 2011-11-04 ENCOUNTER — Other Ambulatory Visit (HOSPITAL_BASED_OUTPATIENT_CLINIC_OR_DEPARTMENT_OTHER): Payer: Medicare Other

## 2011-11-04 DIAGNOSIS — C888 Other malignant immunoproliferative diseases: Secondary | ICD-10-CM

## 2011-11-04 DIAGNOSIS — C9002 Multiple myeloma in relapse: Secondary | ICD-10-CM

## 2011-11-04 LAB — CBC WITH DIFFERENTIAL/PLATELET
BASO%: 0 % (ref 0.0–2.0)
HCT: 27.1 % — ABNORMAL LOW (ref 38.4–49.9)
MCHC: 34.3 g/dL (ref 32.0–36.0)
MONO#: 0.3 10*3/uL (ref 0.1–0.9)
RBC: 2.73 10*6/uL — ABNORMAL LOW (ref 4.20–5.82)
RDW: 19.5 % — ABNORMAL HIGH (ref 11.0–14.6)
WBC: 4.6 10*3/uL (ref 4.0–10.3)
lymph#: 0.3 10*3/uL — ABNORMAL LOW (ref 0.9–3.3)

## 2011-11-04 NOTE — Progress Notes (Signed)
Notified MD on call, Dr. Gaylyn Rong, of patients platelet count of 75.  Order received to hold SQ Velcade.  Patient notified and expressed understanding.  Will also notify Dr. Patsy Lager nurse of order.

## 2011-11-07 LAB — COMPREHENSIVE METABOLIC PANEL
ALT: 14 U/L (ref 0–53)
CO2: 27 mEq/L (ref 19–32)
Calcium: 8.7 mg/dL (ref 8.4–10.5)
Chloride: 102 mEq/L (ref 96–112)
Potassium: 4.4 mEq/L (ref 3.5–5.3)
Sodium: 140 mEq/L (ref 135–145)
Total Protein: 7 g/dL (ref 6.0–8.3)

## 2011-11-07 LAB — KAPPA/LAMBDA LIGHT CHAINS: Kappa free light chain: 8.11 mg/dL — ABNORMAL HIGH (ref 0.33–1.94)

## 2011-11-08 ENCOUNTER — Other Ambulatory Visit: Payer: Self-pay | Admitting: Oncology

## 2011-11-08 DIAGNOSIS — C9002 Multiple myeloma in relapse: Secondary | ICD-10-CM

## 2011-11-09 ENCOUNTER — Telehealth: Payer: Self-pay

## 2011-11-09 NOTE — Telephone Encounter (Signed)
Received call from pt stating his chemo (SQ Velcade) was held on 12/28 & questions if he needs to be r/s for this Friday.  Verified pt's chemo was held on 12/28 per on call MD, Dr Gaylyn Rong for platelets of 75.  12/28 would have been the 3rd weekly injection in the cycle.  Next chemo scheduled for 1/11, along with lab and ML.    Note to Dr Cyndie Chime. dph

## 2011-11-09 NOTE — Telephone Encounter (Signed)
Per Dr Cyndie Chime - pt's chemo is to be r/s for 1/4.  Message left on VM of Michelle, chemo scheduler. dph

## 2011-11-11 ENCOUNTER — Ambulatory Visit: Payer: Medicare Other

## 2011-11-11 ENCOUNTER — Other Ambulatory Visit (HOSPITAL_BASED_OUTPATIENT_CLINIC_OR_DEPARTMENT_OTHER): Payer: Medicare Other | Admitting: Lab

## 2011-11-11 ENCOUNTER — Other Ambulatory Visit: Payer: Self-pay | Admitting: *Deleted

## 2011-11-11 DIAGNOSIS — C9002 Multiple myeloma in relapse: Secondary | ICD-10-CM

## 2011-11-11 DIAGNOSIS — C888 Other malignant immunoproliferative diseases: Secondary | ICD-10-CM

## 2011-11-11 DIAGNOSIS — C9000 Multiple myeloma not having achieved remission: Secondary | ICD-10-CM

## 2011-11-11 LAB — CBC WITH DIFFERENTIAL/PLATELET
Basophils Absolute: 0 10*3/uL (ref 0.0–0.1)
EOS%: 3 % (ref 0.0–7.0)
HCT: 26.2 % — ABNORMAL LOW (ref 38.4–49.9)
HGB: 9.1 g/dL — ABNORMAL LOW (ref 13.0–17.1)
MCH: 34.3 pg — ABNORMAL HIGH (ref 27.2–33.4)
MCHC: 34.7 g/dL (ref 32.0–36.0)
MCV: 98.9 fL — ABNORMAL HIGH (ref 79.3–98.0)
MONO%: 17.5 % — ABNORMAL HIGH (ref 0.0–14.0)
NEUT%: 63.6 % (ref 39.0–75.0)

## 2011-11-14 ENCOUNTER — Other Ambulatory Visit: Payer: Self-pay | Admitting: Oncology

## 2011-11-14 DIAGNOSIS — C9002 Multiple myeloma in relapse: Secondary | ICD-10-CM

## 2011-11-15 ENCOUNTER — Telehealth: Payer: Self-pay | Admitting: Oncology

## 2011-11-15 NOTE — Telephone Encounter (Signed)
Talked to pt , gave him appt date for 11/18/11, he is aware of appt

## 2011-11-18 ENCOUNTER — Other Ambulatory Visit: Payer: Self-pay | Admitting: Oncology

## 2011-11-18 ENCOUNTER — Ambulatory Visit (HOSPITAL_BASED_OUTPATIENT_CLINIC_OR_DEPARTMENT_OTHER): Payer: Medicare Other

## 2011-11-18 ENCOUNTER — Other Ambulatory Visit: Payer: Self-pay | Admitting: *Deleted

## 2011-11-18 ENCOUNTER — Other Ambulatory Visit (HOSPITAL_BASED_OUTPATIENT_CLINIC_OR_DEPARTMENT_OTHER): Payer: Medicare Other | Admitting: Lab

## 2011-11-18 ENCOUNTER — Ambulatory Visit (HOSPITAL_BASED_OUTPATIENT_CLINIC_OR_DEPARTMENT_OTHER): Payer: Medicare Other | Admitting: Nurse Practitioner

## 2011-11-18 VITALS — BP 124/61 | HR 91 | Temp 97.6°F | Ht 66.0 in | Wt 265.2 lb

## 2011-11-18 DIAGNOSIS — C9000 Multiple myeloma not having achieved remission: Secondary | ICD-10-CM

## 2011-11-18 DIAGNOSIS — D6959 Other secondary thrombocytopenia: Secondary | ICD-10-CM

## 2011-11-18 DIAGNOSIS — T451X5A Adverse effect of antineoplastic and immunosuppressive drugs, initial encounter: Secondary | ICD-10-CM

## 2011-11-18 DIAGNOSIS — N049 Nephrotic syndrome with unspecified morphologic changes: Secondary | ICD-10-CM

## 2011-11-18 DIAGNOSIS — Z5112 Encounter for antineoplastic immunotherapy: Secondary | ICD-10-CM

## 2011-11-18 LAB — CBC WITH DIFFERENTIAL/PLATELET
Basophils Absolute: 0 10*3/uL (ref 0.0–0.1)
Eosinophils Absolute: 0.1 10*3/uL (ref 0.0–0.5)
HCT: 26.2 % — ABNORMAL LOW (ref 38.4–49.9)
HGB: 8.8 g/dL — ABNORMAL LOW (ref 13.0–17.1)
NEUT#: 4.3 10*3/uL (ref 1.5–6.5)
RDW: 16.7 % — ABNORMAL HIGH (ref 11.0–14.6)
lymph#: 0.5 10*3/uL — ABNORMAL LOW (ref 0.9–3.3)
nRBC: 0 % (ref 0–0)

## 2011-11-18 MED ORDER — MELPHALAN 2 MG PO TABS: 8.0000 mg | ORAL_TABLET | Freq: Every day | ORAL | Status: AC

## 2011-11-18 MED ORDER — BORTEZOMIB CHEMO SQ INJECTION 3.5 MG (2.5MG/ML)
1.0000 mg/m2 | Freq: Once | INTRAMUSCULAR | Status: AC
  Administered 2011-11-18: 2.25 mg via SUBCUTANEOUS
  Filled 2011-11-18: qty 2.25

## 2011-11-18 MED ORDER — ONDANSETRON HCL 8 MG PO TABS
8.0000 mg | ORAL_TABLET | Freq: Once | ORAL | Status: AC
  Administered 2011-11-18: 8 mg via ORAL

## 2011-11-18 NOTE — Progress Notes (Signed)
OFFICE PROGRESS NOTE  Interval history:  William Gaines is a 71 year old man with multiple myeloma. He is on active treatment with Velcade 1 mg per meter squared 3 weeks out of 4 and melphalan 5 mg per meter squared 4 days every month. Most recent 24-hour urine 09/30/2011 was further improved.  Treatment was placed on hold beginning 11/04/2011 due to a platelet count of 75,000. Repeat labs on 11/11/2011 returned with a platelet count of 60,000.  William Gaines denies spontaneous bleeding. He notes that he bruises easily with minor trauma. No fevers. No shortness of breath or cough. He thinks his chronic back pain may be slightly worse. He does not take pain medication. He denies nausea/vomiting. Since beginning the Velcade/melphalan he notes frequent indigestion.   Objective: Blood pressure 124/61, pulse 91, temperature 97.6 F (36.4 C), temperature source Oral, height 5\' 6"  (1.676 m), weight 265 lb 3.2 oz (120.294 kg).  Oropharynx is without thrush or ulceration. Lungs are clear. No wheezes or rales. Regular cardiac rhythm. Abdomen is soft, obese. He declined to lay flat on the examination table. Persistent firm edema below the knees bilaterally. Chronic venous stasis changes. Motor strength 5 over 5.   Lab Results: Lab Results  Component Value Date   WBC 5.4 11/18/2011   HGB 8.8* 11/18/2011   HCT 26.2* 11/18/2011   MCV 96.7 11/18/2011   PLT 70* 11/18/2011    Chemistry:    Chemistry      Component Value Date/Time   NA 140 11/04/2011 1314   NA 140 11/04/2011 1314   NA 137 07/07/2011 1217   K 4.4 11/04/2011 1314   K 4.4 11/04/2011 1314   K 4.8* 07/07/2011 1217   CL 102 11/04/2011 1314   CL 102 11/04/2011 1314   CL 99 07/07/2011 1217   CO2 27 11/04/2011 1314   CO2 27 11/04/2011 1314   CO2 29 07/07/2011 1217   BUN 47* 11/04/2011 1314   BUN 47* 11/04/2011 1314   BUN 41* 07/07/2011 1217   CREATININE 2.38* 11/04/2011 1314   CREATININE 2.38* 11/04/2011 1314   CREATININE 2.1* 07/07/2011 1217   CREATININE 1.90* 03/25/2011 1008   CREATININE 1.90* 03/25/2011 1008   CREATININE 1.90* 03/25/2011 1008   CREATININE 1.90* 03/25/2011 1008      Component Value Date/Time   CALCIUM 8.7 11/04/2011 1314   CALCIUM 8.7 11/04/2011 1314   CALCIUM 8.7 07/07/2011 1217   ALKPHOS 56 11/04/2011 1314   ALKPHOS 56 11/04/2011 1314   AST 13 11/04/2011 1314   AST 13 11/04/2011 1314   ALT 14 11/04/2011 1314   ALT 14 11/04/2011 1314   BILITOT 0.3 11/04/2011 1314   BILITOT 0.3 11/04/2011 1314       Studies/Results: No results found.  Medications: I have reviewed the patient's current medications.  Assessment/Plan:  1. 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 He began this regimen 06/10/2011. Velcade was dose reduced beginning 07/01/2011 from 1.5 mg per meter squared to 1 mg per meter squared and melphalan was reduced from 7 mg per meter squared for 4 days each month to 5 mg per meter squared due to pancytopenia. Followup 24-hour urine on 08/01/2011 was improved. 24-hour urine collection 09/30/2011 was further improved. Due to progressive thrombocytopenia treatment has been on hold since 11/04/2011. 2. Thrombocytopenia secondary to chemotherapy. 3. Chronic nephrotic syndrome. 4. Significant peripheral edema secondary to chronic nephrotic syndrome. Venous Doppler of the right lower extremity was negative during the recent  hospitalization. 5. Essential hypertension. 6. Insulin-dependent diabetes. 7. Obesity. 8. Acyclovir prophylaxis while on Velcade. 9. Hospitalization 10/04/2011 through 10/06/2011 with probable acute viral illness. 10. Escherichia coli and Morganella morganii urinary tract infection December 2012. 11. Chronic back pain.  Disposition-William Gaines's counts are beginning to recover. Dr. Cyndie Chime recommends proceeding with the next cycle of Velcade/melphalan today as planned. The Velcade dose will remain at 1  mg per meter squared 3 weeks on followed by a one-week break. Melphalan will be further dose reduced to 4 mg per meter squared for 4 days each month (use BSA 2.0). William Gaines will return for Velcade 11/25/2011 and 12/02/2011 to complete the current cycle. He will return to begin the next cycle on 12/16/2011. He has a followup visit with Dr. Cyndie Chime 12/23/2011. He will contact the office the interim with any problems.  Patient seen with Dr. Cyndie Chime.      Lonna Cobb ANP/GNP-BC

## 2011-11-21 LAB — UIFE/LIGHT CHAINS/TP QN, 24-HR UR
Alpha 1, Urine: DETECTED — AB
Alpha 2, Urine: DETECTED — AB
Beta, Urine: DETECTED — AB
Free Kappa Lt Chains,Ur: 15.3 mg/dL — ABNORMAL HIGH (ref 0.14–2.42)
Gamma Globulin, Urine: DETECTED — AB

## 2011-11-23 ENCOUNTER — Telehealth: Payer: Self-pay | Admitting: *Deleted

## 2011-11-23 NOTE — Telephone Encounter (Signed)
Received confirmation fax from [facility] that alkeran script has been shipped on 11/22/11 for delivery next business day.

## 2011-11-25 ENCOUNTER — Ambulatory Visit (HOSPITAL_BASED_OUTPATIENT_CLINIC_OR_DEPARTMENT_OTHER): Payer: Medicare Other

## 2011-11-25 ENCOUNTER — Other Ambulatory Visit (HOSPITAL_BASED_OUTPATIENT_CLINIC_OR_DEPARTMENT_OTHER): Payer: Medicare Other

## 2011-11-25 DIAGNOSIS — C9000 Multiple myeloma not having achieved remission: Secondary | ICD-10-CM

## 2011-11-25 DIAGNOSIS — C9002 Multiple myeloma in relapse: Secondary | ICD-10-CM

## 2011-11-25 DIAGNOSIS — N08 Glomerular disorders in diseases classified elsewhere: Secondary | ICD-10-CM

## 2011-11-25 DIAGNOSIS — Z5112 Encounter for antineoplastic immunotherapy: Secondary | ICD-10-CM

## 2011-11-25 LAB — CBC WITH DIFFERENTIAL/PLATELET
BASO%: 0.2 % (ref 0.0–2.0)
EOS%: 3.6 % (ref 0.0–7.0)
MCH: 32.3 pg (ref 27.2–33.4)
MCHC: 33 g/dL (ref 32.0–36.0)
MCV: 97.9 fL (ref 79.3–98.0)
MONO%: 9.3 % (ref 0.0–14.0)
RBC: 2.82 10*6/uL — ABNORMAL LOW (ref 4.20–5.82)
RDW: 16.5 % — ABNORMAL HIGH (ref 11.0–14.6)
lymph#: 0.6 10*3/uL — ABNORMAL LOW (ref 0.9–3.3)

## 2011-11-25 MED ORDER — BORTEZOMIB CHEMO SQ INJECTION 3.5 MG (2.5MG/ML)
1.0000 mg/m2 | Freq: Once | INTRAMUSCULAR | Status: AC
  Administered 2011-11-25: 2.25 mg via SUBCUTANEOUS
  Filled 2011-11-25: qty 2.25

## 2011-11-25 MED ORDER — ONDANSETRON HCL 8 MG PO TABS: 8.0000 mg | ORAL_TABLET | Freq: Once | ORAL | Status: DC

## 2011-11-25 NOTE — Progress Notes (Signed)
Ok to treat with SQ Velcade with platelet count of 90 per Misty Stanley, NP. Pt took po zofran prior to arrival.  dph

## 2011-11-25 NOTE — Patient Instructions (Signed)
Pt ambulates out of dept with spouse.  Aware of next appt. dph 

## 2011-12-02 ENCOUNTER — Other Ambulatory Visit: Payer: Medicare Other

## 2011-12-02 ENCOUNTER — Ambulatory Visit: Payer: Medicare Other

## 2011-12-02 ENCOUNTER — Telehealth: Payer: Self-pay | Admitting: *Deleted

## 2011-12-02 NOTE — Telephone Encounter (Signed)
Pt. Called & cancelled his appt for today due to weather b/c he lives a distance away.  He was to get velcade.  Dr. Cyndie Chime notified.  Pt. Need to know what he is to do now.

## 2011-12-05 ENCOUNTER — Other Ambulatory Visit: Payer: Self-pay

## 2011-12-05 NOTE — Progress Notes (Signed)
Onc Tx Schedule routed to schedulers to r/s pt's chemo that he missed on Friday 1/25 (d/t weather) for this Friday 2/1. Appts on 2/8 to be cancelled, & new cycle to begin on 2/15.  dph

## 2011-12-09 ENCOUNTER — Other Ambulatory Visit: Payer: Self-pay | Admitting: Oncology

## 2011-12-09 ENCOUNTER — Other Ambulatory Visit (HOSPITAL_BASED_OUTPATIENT_CLINIC_OR_DEPARTMENT_OTHER): Payer: Medicare Other | Admitting: Lab

## 2011-12-09 ENCOUNTER — Ambulatory Visit (HOSPITAL_BASED_OUTPATIENT_CLINIC_OR_DEPARTMENT_OTHER): Payer: Medicare Other

## 2011-12-09 ENCOUNTER — Ambulatory Visit: Payer: Medicare Other

## 2011-12-09 DIAGNOSIS — C888 Other malignant immunoproliferative diseases: Secondary | ICD-10-CM

## 2011-12-09 DIAGNOSIS — N2889 Other specified disorders of kidney and ureter: Secondary | ICD-10-CM

## 2011-12-09 DIAGNOSIS — Z5111 Encounter for antineoplastic chemotherapy: Secondary | ICD-10-CM

## 2011-12-09 DIAGNOSIS — C9000 Multiple myeloma not having achieved remission: Secondary | ICD-10-CM

## 2011-12-09 LAB — CBC WITH DIFFERENTIAL/PLATELET
Basophils Absolute: 0 10*3/uL (ref 0.0–0.1)
Eosinophils Absolute: 0.1 10*3/uL (ref 0.0–0.5)
HCT: 26.7 % — ABNORMAL LOW (ref 38.4–49.9)
HGB: 9.2 g/dL — ABNORMAL LOW (ref 13.0–17.1)
MONO#: 0.6 10*3/uL (ref 0.1–0.9)
NEUT#: 4.7 10*3/uL (ref 1.5–6.5)
NEUT%: 79.1 % — ABNORMAL HIGH (ref 39.0–75.0)
WBC: 6 10*3/uL (ref 4.0–10.3)
lymph#: 0.5 10*3/uL — ABNORMAL LOW (ref 0.9–3.3)

## 2011-12-09 MED ORDER — BORTEZOMIB CHEMO SQ INJECTION 3.5 MG (2.5MG/ML)
1.0000 mg/m2 | Freq: Once | INTRAMUSCULAR | Status: AC
  Administered 2011-12-09: 2.25 mg via SUBCUTANEOUS
  Filled 2011-12-09: qty 2.25

## 2011-12-14 ENCOUNTER — Other Ambulatory Visit: Payer: Self-pay | Admitting: *Deleted

## 2011-12-14 MED ORDER — ACYCLOVIR 400 MG PO TABS: 400.0000 mg | ORAL_TABLET | Freq: Two times a day (BID) | ORAL | Status: DC

## 2011-12-16 ENCOUNTER — Ambulatory Visit: Payer: Medicare Other

## 2011-12-16 ENCOUNTER — Other Ambulatory Visit: Payer: Medicare Other | Admitting: Lab

## 2011-12-18 ENCOUNTER — Other Ambulatory Visit: Payer: Self-pay | Admitting: Oncology

## 2011-12-20 ENCOUNTER — Telehealth: Payer: Self-pay | Admitting: *Deleted

## 2011-12-20 NOTE — Telephone Encounter (Signed)
Received fax confirmation of prescription shipment of alkeran from [facility] for shipment 12/19/11 & delivery next business day.

## 2011-12-22 ENCOUNTER — Other Ambulatory Visit: Payer: Self-pay | Admitting: Oncology

## 2011-12-22 DIAGNOSIS — C9000 Multiple myeloma not having achieved remission: Secondary | ICD-10-CM

## 2011-12-23 ENCOUNTER — Ambulatory Visit (HOSPITAL_COMMUNITY)
Admission: RE | Admit: 2011-12-23 | Discharge: 2011-12-23 | Disposition: A | Discharge status: Home / Self Care | Payer: Medicare Other | Source: Ambulatory Visit | Attending: Oncology | Admitting: Oncology

## 2011-12-23 ENCOUNTER — Telehealth: Payer: Self-pay | Admitting: *Deleted

## 2011-12-23 ENCOUNTER — Other Ambulatory Visit: Payer: Medicare Other | Admitting: Lab

## 2011-12-23 ENCOUNTER — Ambulatory Visit: Payer: Medicare Other | Admitting: Oncology

## 2011-12-23 ENCOUNTER — Ambulatory Visit (HOSPITAL_BASED_OUTPATIENT_CLINIC_OR_DEPARTMENT_OTHER): Payer: Medicare Other

## 2011-12-23 ENCOUNTER — Encounter: Payer: Self-pay | Admitting: Oncology

## 2011-12-23 VITALS — BP 107/52 | HR 80 | Temp 96.8°F | Ht 66.0 in | Wt 259.7 lb

## 2011-12-23 DIAGNOSIS — Z981 Arthrodesis status: Secondary | ICD-10-CM | POA: Insufficient documentation

## 2011-12-23 DIAGNOSIS — C9000 Multiple myeloma not having achieved remission: Secondary | ICD-10-CM | POA: Insufficient documentation

## 2011-12-23 DIAGNOSIS — N049 Nephrotic syndrome with unspecified morphologic changes: Secondary | ICD-10-CM

## 2011-12-23 DIAGNOSIS — Z5112 Encounter for antineoplastic immunotherapy: Secondary | ICD-10-CM

## 2011-12-23 DIAGNOSIS — M8448XA Pathological fracture, other site, initial encounter for fracture: Secondary | ICD-10-CM

## 2011-12-23 DIAGNOSIS — M545 Low back pain: Secondary | ICD-10-CM

## 2011-12-23 DIAGNOSIS — M899 Disorder of bone, unspecified: Secondary | ICD-10-CM | POA: Insufficient documentation

## 2011-12-23 DIAGNOSIS — N08 Glomerular disorders in diseases classified elsewhere: Secondary | ICD-10-CM

## 2011-12-23 HISTORY — DX: Pathological fracture, other site, initial encounter for fracture: M84.48XA

## 2011-12-23 LAB — CBC WITH DIFFERENTIAL/PLATELET
Eosinophils Absolute: 0.1 10*3/uL (ref 0.0–0.5)
LYMPH%: 8.8 % — ABNORMAL LOW (ref 14.0–49.0)
MCH: 33.6 pg — ABNORMAL HIGH (ref 27.2–33.4)
MCHC: 34.2 g/dL (ref 32.0–36.0)
MCV: 98.3 fL — ABNORMAL HIGH (ref 79.3–98.0)
MONO%: 7.3 % (ref 0.0–14.0)
NEUT#: 3.8 10*3/uL (ref 1.5–6.5)
Platelets: 114 10*3/uL — ABNORMAL LOW (ref 140–400)
RBC: 2.85 10*6/uL — ABNORMAL LOW (ref 4.20–5.82)

## 2011-12-23 MED ORDER — BORTEZOMIB CHEMO SQ INJECTION 3.5 MG (2.5MG/ML)
1.0000 mg/m2 | Freq: Once | INTRAMUSCULAR | Status: AC
  Administered 2011-12-23: 2.25 mg via SUBCUTANEOUS
  Filled 2011-12-23: qty 2.25

## 2011-12-23 MED ORDER — ONDANSETRON HCL 8 MG PO TABS: 8.0000 mg | ORAL_TABLET | Freq: Once | ORAL | Status: DC

## 2011-12-23 NOTE — Telephone Encounter (Signed)
Pt notified of bone survey results per Dr. Cyndie Chime.

## 2011-12-23 NOTE — Patient Instructions (Signed)
[facility] Discharge Instructions for Patients Receiving Chemotherapy  Today you received the following chemotherapy agents: VELCADE To help prevent nausea and vomiting after your treatment, we encourage you to take your nausea medication Begin taking it at as SOON AS YOU FEEL QUEASY OR NAUSEATED and as  often as prescribed .   If you develop nausea and vomiting that is not controlled by your nausea medication, call the clinic. If it is after clinic hours your family physician or the after hours number for the clinic or go to the Emergency Department.   BELOW ARE SYMPTOMS THAT SHOULD BE REPORTED IMMEDIATELY:  *FEVER GREATER THAN 100.5 F  *CHILLS WITH OR WITHOUT FEVER  NAUSEA AND VOMITING THAT IS NOT CONTROLLED WITH YOUR NAUSEA MEDICATION  *UNUSUAL SHORTNESS OF BREATH  *UNUSUAL BRUISING OR BLEEDING  TENDERNESS IN MOUTH AND THROAT WITH OR WITHOUT PRESENCE OF ULCERS  *URINARY PROBLEMS  *BOWEL PROBLEMS  UNUSUAL RASH Items with * indicate a potential emergency and should be followed up as soon as possible.  One of the nurses will contact you 24 hours after your treatment. Please let the nurse know about any problems that you may have experienced. Feel free to call the clinic you have any questions or concerns. The clinic phone number is [phone].   I have been informed and understand all the instructions given to me. I know to contact the clinic, my physician, or go to the Emergency Department if any problems should occur. I do not have any questions at this time, but understand that I may call the clinic during office hours   should I have any questions or need assistance in obtaining follow up care.    __________________________________________  _____________  __________ Signature of Patient or Authorized Representative            Date                   Time    __________________________________________ Nurse's Signature

## 2011-12-23 NOTE — Telephone Encounter (Signed)
Message copied by Sabino Snipes on Fri Dec 23, 2011  6:19 PM ------      Message from: Levert Feinstein      Created: Fri Dec 23, 2011  4:04 PM       Call pt - bone x-rays look good; he may have a loose screw from previous surgery.

## 2011-12-23 NOTE — Progress Notes (Signed)
Hematology and Oncology Follow Up Visit  William Gaines [id number] December 31, 1940 71 y.o. 12/23/2011 2:49 PM   Principle Diagnosis: Encounter Diagnoses  Name Primary?  . Multiple myeloma without mention of remission   . Myeloma kidney disease   . Nephrotic syndrome in other disease   . Back pain, lumbosacral   . Fracture, vertebra, pathologic Yes     Interim History:   Followup visit for this now 71 year old man with kappa light chain multiple myeloma complicated by previous diagnosis of nephrotic syndrome, insulin-dependent diabetes, and chronic renal insufficiency. Initial presentation of his myeloma with a pathologic fracture of his spine in November 2007. treated with radiation and subsequent surgical stabilization procedure. Approximate 6 months of initial treatment with Revlimid dexamethasone with a nice response. Multiple complications with the dexamethasone with infection and uncontrolled sugars. He was in a plateau phase with stable paraprotein levels and urine proteins until he developed a new lytic lesion of his left iliac bone treated with 2000 cGy of radiation by Dr. Margaretmary Dys between March 8 and 01/26/2011. Urine and serum protein levels began to rise. He was put back on a hemotherapy program with a combination of Velcade initially 1.3 subsequently decreased to 1 mg per meter squared weekly x31 week rest with oral melphalan initially at 7 mg per meter squared total dose 14 mg 4 days each 28 day cycle and subsequently decreased to 4 mg per meter squared total dose 8 mg 4 days in around each cycle. This program was started 06/19/2011. Once again we have seen a response with a plateau in his lab parameters. 24-hour urine total protein was 5.8 g back in may now down to 1.8 g as of 11/16/2011. This is nonselective proteinuria with no monoclonal protein seen on immunofixation electrophoresis. Serum kappa free light chains have fallen from 61 mg percent on 05/27/2011 to most recent value  of 8.1 mg percent as of 11/04/2011.  He has had no interim problems. No infections. Sugars under better control. He is not receiving any steroids with this regimen. His only complaint is that his back pain is increasing again. We have not been any x-rays in a while and I will go ahead and get a bone survey today and a CT scan of his spine next week.  Medications: reviewed  Allergies:  Allergies  Allergen Reactions  . Codeine     hallucinations  . Lorazepam (Ativan) Anxiety    Paradoxical agitation when given as pre med for bone marrow biopsy 09/11/06    Review of Systems: Constitutional:   No constitutional symptoms Respiratory: Dyspnea on exertion but not at rest Cardiovascular:  No chest pain Gastrointestinal: No change in bowel habit Genito-Urinary: No urinary tract symptoms Musculoskeletal: No other areas of pain other than the back Neurologic: No headache or change in vision Skin: No skin rash Remaining ROS negative.  Physical Exam: Blood pressure 107/52, pulse 80, temperature 96.8 F (36 C), temperature source Oral, height 5\' 6"  (1.676 m), weight 259 lb 11.2 oz (117.799 kg). Wt Readings from Last 3 Encounters:  12/23/11 259 lb 11.2 oz (117.799 kg)  11/18/11 265 lb 3.2 oz (120.294 kg)  10/13/11 262 lb 11.2 oz (119.16 kg)     General appearance: Pleasant overweight Caucasian man HENNT: Pharynx no erythema or exudate Lymph nodes: No adenopathy Breasts: Lungs: Lungs clear to auscultation resonant to percussion Heart: Regular rhythm no murmur Abdomen: Soft obese nontender large on the likely hernia Extremities: Chronic 2+ brawny edema no skin breakdown at present  Vascular: No cyanosis Neurologic: Motor strength is 5 over 5 reflexes absent symmetric at the knees 1+ symmetric at the biceps Skin: Venous stasis changes lower extremities unchanged  Lab Results: Lab Results  Component Value Date   WBC 4.7 12/23/2011   HGB 9.6* 12/23/2011   HCT 28.0* 12/23/2011   MCV 98.3*  12/23/2011   PLT 114* 12/23/2011     Chemistry      Component Value Date/Time   NA 140 11/04/2011 1314   NA 140 11/04/2011 1314   NA 137 07/07/2011 1217   K 4.4 11/04/2011 1314   K 4.4 11/04/2011 1314   K 4.8* 07/07/2011 1217   CL 102 11/04/2011 1314   CL 102 11/04/2011 1314   CL 99 07/07/2011 1217   CO2 27 11/04/2011 1314   CO2 27 11/04/2011 1314   CO2 29 07/07/2011 1217   BUN 47* 11/04/2011 1314   BUN 47* 11/04/2011 1314   BUN 41* 07/07/2011 1217   CREATININE 2.38* 11/04/2011 1314   CREATININE 2.38* 11/04/2011 1314   CREATININE 2.1* 07/07/2011 1217   CREATININE 1.90* 03/25/2011 1008   CREATININE 1.90* 03/25/2011 1008   CREATININE 1.90* 03/25/2011 1008   CREATININE 1.90* 03/25/2011 1008      Component Value Date/Time   CALCIUM 8.7 11/04/2011 1314   CALCIUM 8.7 11/04/2011 1314   CALCIUM 8.7 07/07/2011 1217   ALKPHOS 56 11/04/2011 1314   ALKPHOS 56 11/04/2011 1314   AST 13 11/04/2011 1314   AST 13 11/04/2011 1314   ALT 14 11/04/2011 1314   ALT 14 11/04/2011 1314   BILITOT 0.3 11/04/2011 1314   BILITOT 0.3 11/04/2011 1314       Radiological Studies: Bone survey today shows healing outside of radiation to the left iliac crest. No new lesions. Possible loosening of some of his spinal hardware.   Impression and Plan: #1. Kappa light chain versus nonsecretory multiple myeloma. He is responded to second line therapy with Alkeran plus Velcade. Although end point therapy is arbitrary, I'm going to continue the current program since he is tolerating the drugs well and we are seeing a response.  #2. Nephrotic syndrome. Complex antedated his myeloma but likely some component of myeloma kidney most recent 24-hour urine showed no monoclonal protein on IFE.  #3. Increased back pain. No gross change on his bone survey done today. Possible loosening of orthopedic hardware. I'm going to get a CT scan next week. Further evaluation per his neurosurgeon.  #4. Long-standing insulin-dependent  diabetes. New  #5. Essential hypertension.  #6. Obesity.  #7. Obstructive airway disease.   CC:. Dr. Lyn Hollingshead; Dr.Stovall   Levert Feinstein, MD 2/15/20132:49 PM

## 2011-12-26 ENCOUNTER — Telehealth: Payer: Self-pay | Admitting: Oncology

## 2011-12-26 LAB — COMPREHENSIVE METABOLIC PANEL
ALT: 18 U/L (ref 0–53)
Alkaline Phosphatase: 59 U/L (ref 39–117)
Creatinine, Ser: 2.39 mg/dL — ABNORMAL HIGH (ref 0.50–1.35)
Sodium: 139 mEq/L (ref 135–145)
Total Bilirubin: 0.2 mg/dL — ABNORMAL LOW (ref 0.3–1.2)
Total Protein: 6.7 g/dL (ref 6.0–8.3)

## 2011-12-26 LAB — IGG, IGA, IGM
IgA: 368 mg/dL (ref 68–379)
IgM, Serum: 36 mg/dL — ABNORMAL LOW (ref 41–251)

## 2011-12-26 LAB — KAPPA/LAMBDA LIGHT CHAINS
Kappa free light chain: 8.35 mg/dL — ABNORMAL HIGH (ref 0.33–1.94)
Kappa:Lambda Ratio: 1.79 — ABNORMAL HIGH (ref 0.26–1.65)

## 2011-12-26 NOTE — Telephone Encounter (Signed)
Talked to pr's wife and gave her appt for 12/30/11, she will come by on 2/22 to pick up calendar appt.

## 2011-12-30 ENCOUNTER — Other Ambulatory Visit: Payer: Medicare Other | Admitting: Lab

## 2011-12-30 ENCOUNTER — Ambulatory Visit (HOSPITAL_BASED_OUTPATIENT_CLINIC_OR_DEPARTMENT_OTHER): Payer: Medicare Other

## 2011-12-30 ENCOUNTER — Ambulatory Visit (HOSPITAL_COMMUNITY)
Admission: RE | Admit: 2011-12-30 | Discharge: 2011-12-30 | Disposition: A | Discharge status: Home / Self Care | Payer: Medicare Other | Source: Ambulatory Visit | Attending: Oncology | Admitting: Oncology

## 2011-12-30 DIAGNOSIS — X58XXXA Exposure to other specified factors, initial encounter: Secondary | ICD-10-CM | POA: Insufficient documentation

## 2011-12-30 DIAGNOSIS — M545 Low back pain, unspecified: Secondary | ICD-10-CM | POA: Insufficient documentation

## 2011-12-30 DIAGNOSIS — C9000 Multiple myeloma not having achieved remission: Secondary | ICD-10-CM

## 2011-12-30 DIAGNOSIS — C888 Other malignant immunoproliferative diseases: Secondary | ICD-10-CM

## 2011-12-30 DIAGNOSIS — Z981 Arthrodesis status: Secondary | ICD-10-CM | POA: Insufficient documentation

## 2011-12-30 DIAGNOSIS — M8448XA Pathological fracture, other site, initial encounter for fracture: Secondary | ICD-10-CM

## 2011-12-30 DIAGNOSIS — M546 Pain in thoracic spine: Secondary | ICD-10-CM | POA: Insufficient documentation

## 2011-12-30 DIAGNOSIS — I2510 Atherosclerotic heart disease of native coronary artery without angina pectoris: Secondary | ICD-10-CM | POA: Insufficient documentation

## 2011-12-30 DIAGNOSIS — Z5112 Encounter for antineoplastic immunotherapy: Secondary | ICD-10-CM

## 2011-12-30 DIAGNOSIS — N08 Glomerular disorders in diseases classified elsewhere: Secondary | ICD-10-CM

## 2011-12-30 DIAGNOSIS — I700 Atherosclerosis of aorta: Secondary | ICD-10-CM | POA: Insufficient documentation

## 2011-12-30 DIAGNOSIS — N049 Nephrotic syndrome with unspecified morphologic changes: Secondary | ICD-10-CM

## 2011-12-30 DIAGNOSIS — S22009A Unspecified fracture of unspecified thoracic vertebra, initial encounter for closed fracture: Secondary | ICD-10-CM | POA: Insufficient documentation

## 2011-12-30 DIAGNOSIS — M5380 Other specified dorsopathies, site unspecified: Secondary | ICD-10-CM | POA: Insufficient documentation

## 2011-12-30 DIAGNOSIS — J9819 Other pulmonary collapse: Secondary | ICD-10-CM | POA: Insufficient documentation

## 2011-12-30 LAB — CBC WITH DIFFERENTIAL/PLATELET
BASO%: 0.2 % (ref 0.0–2.0)
Basophils Absolute: 0 10*3/uL (ref 0.0–0.1)
EOS%: 3.1 % (ref 0.0–7.0)
HCT: 29.5 % — ABNORMAL LOW (ref 38.4–49.9)
HGB: 9.9 g/dL — ABNORMAL LOW (ref 13.0–17.1)
LYMPH%: 9.7 % — ABNORMAL LOW (ref 14.0–49.0)
MCH: 32.4 pg (ref 27.2–33.4)
MCHC: 33.6 g/dL (ref 32.0–36.0)
MCV: 96.4 fL (ref 79.3–98.0)
NEUT%: 79.6 % — ABNORMAL HIGH (ref 39.0–75.0)
Platelets: 91 10*3/uL — ABNORMAL LOW (ref 140–400)

## 2011-12-30 MED ORDER — BORTEZOMIB CHEMO SQ INJECTION 3.5 MG (2.5MG/ML)
1.0000 mg/m2 | Freq: Once | INTRAMUSCULAR | Status: AC
  Administered 2011-12-30: 2.25 mg via SUBCUTANEOUS
  Filled 2011-12-30: qty 2.25

## 2011-12-30 NOTE — Progress Notes (Signed)
1025-OK to treat with platelet count of 91 per Dr. Cyndie Chime.  Pt without complaints at this time.

## 2011-12-30 NOTE — Patient Instructions (Signed)
[facility] Discharge Instructions for Patients Receiving Chemotherapy  Today you received the following chemotherapy agents:  Velcade  To help prevent nausea and vomiting after your treatment, we encourage you to take your nausea medication as ordered per MD.    If you develop nausea and vomiting that is not controlled by your nausea medication, call the clinic. If it is after clinic hours your family physician or the after hours number for the clinic or go to the Emergency Department.   BELOW ARE SYMPTOMS THAT SHOULD BE REPORTED IMMEDIATELY:  *FEVER GREATER THAN 100.5 F  *CHILLS WITH OR WITHOUT FEVER  NAUSEA AND VOMITING THAT IS NOT CONTROLLED WITH YOUR NAUSEA MEDICATION  *UNUSUAL SHORTNESS OF BREATH  *UNUSUAL BRUISING OR BLEEDING  TENDERNESS IN MOUTH AND THROAT WITH OR WITHOUT PRESENCE OF ULCERS  *URINARY PROBLEMS  *BOWEL PROBLEMS  UNUSUAL RASH Items with * indicate a potential emergency and should be followed up as soon as possible.   Please let the nurse know about any problems that you may have experienced. Feel free to call the clinic you have any questions or concerns. The clinic phone number is [phone].   I have been informed and understand all the instructions given to me. I know to contact the clinic, my physician, or go to the Emergency Department if any problems should occur. I do not have any questions at this time, but understand that I may call the clinic during office hours   should I have any questions or need assistance in obtaining follow up care.    __________________________________________  _____________  __________ Signature of Patient or Authorized Representative            Date                   Time    __________________________________________ Nurse's Signature    

## 2012-01-06 ENCOUNTER — Ambulatory Visit (HOSPITAL_BASED_OUTPATIENT_CLINIC_OR_DEPARTMENT_OTHER): Payer: Medicare Other

## 2012-01-06 ENCOUNTER — Other Ambulatory Visit: Payer: Medicare Other | Admitting: Lab

## 2012-01-06 DIAGNOSIS — M545 Low back pain: Secondary | ICD-10-CM

## 2012-01-06 DIAGNOSIS — C9000 Multiple myeloma not having achieved remission: Secondary | ICD-10-CM

## 2012-01-06 DIAGNOSIS — N049 Nephrotic syndrome with unspecified morphologic changes: Secondary | ICD-10-CM

## 2012-01-06 DIAGNOSIS — M8448XA Pathological fracture, other site, initial encounter for fracture: Secondary | ICD-10-CM

## 2012-01-06 DIAGNOSIS — Z5111 Encounter for antineoplastic chemotherapy: Secondary | ICD-10-CM

## 2012-01-06 LAB — CBC WITH DIFFERENTIAL/PLATELET
Basophils Absolute: 0 10*3/uL (ref 0.0–0.1)
EOS%: 3 % (ref 0.0–7.0)
Eosinophils Absolute: 0.1 10*3/uL (ref 0.0–0.5)
HGB: 9.5 g/dL — ABNORMAL LOW (ref 13.0–17.1)
MONO%: 6.8 % (ref 0.0–14.0)
NEUT#: 3.7 10*3/uL (ref 1.5–6.5)
RBC: 2.84 10*6/uL — ABNORMAL LOW (ref 4.20–5.82)
RDW: 17.6 % — ABNORMAL HIGH (ref 11.0–14.6)
lymph#: 0.3 10*3/uL — ABNORMAL LOW (ref 0.9–3.3)

## 2012-01-06 MED ORDER — BORTEZOMIB CHEMO SQ INJECTION 3.5 MG (2.5MG/ML)
1.0000 mg/m2 | Freq: Once | INTRAMUSCULAR | Status: AC
  Administered 2012-01-06: 2.25 mg via SUBCUTANEOUS
  Filled 2012-01-06: qty 2.25

## 2012-01-06 MED ORDER — ONDANSETRON HCL 8 MG PO TABS: 8.0000 mg | ORAL_TABLET | Freq: Once | ORAL | Status: DC

## 2012-01-06 NOTE — Patient Instructions (Signed)
[name] Discharge Instructions for Patients Receiving Chemotherapy  Today you received the following chemotherapy agents Velcade To help prevent nausea and vomiting after your treatment, we encourage you to take your nausea medication Dr. Granfortuna.   If you develop nausea and vomiting that is not controlled by your nausea medication, call the clinic. If it is after clinic hours your family physician or the after hours number for the clinic or go to the Emergency Department.   BELOW ARE SYMPTOMS THAT SHOULD BE REPORTED IMMEDIATELY:  *FEVER GREATER THAN 100.5 F  *CHILLS WITH OR WITHOUT FEVER  NAUSEA AND VOMITING THAT IS NOT CONTROLLED WITH YOUR NAUSEA MEDICATION  *UNUSUAL SHORTNESS OF BREATH  *UNUSUAL BRUISING OR BLEEDING  TENDERNESS IN MOUTH AND THROAT WITH OR WITHOUT PRESENCE OF ULCERS  *URINARY PROBLEMS  *BOWEL PROBLEMS  UNUSUAL RASH Items with * indicate a potential emergency and should be followed up as soon as possible.   Feel free to call the clinic you have any questions or concerns. The clinic phone number is [phone].   I have been informed and understand all the instructions given to me. I know to contact the clinic, my physician, or go to the Emergency Department if any problems should occur. I do not have any questions at this time, but understand that I may call the clinic during office hours   should I have any questions or need assistance in obtaining follow up care.    __________________________________________  _____________  __________ Signature of Patient or Authorized Representative            Date                   Time    __________________________________________ Nurse's Signature    

## 2012-01-06 NOTE — Progress Notes (Signed)
Patient took Zofran 8mg  @ 0915 this morning.

## 2012-01-09 LAB — COMPREHENSIVE METABOLIC PANEL
AST: 18 U/L (ref 0–37)
Albumin: 3.3 g/dL — ABNORMAL LOW (ref 3.5–5.2)
Alkaline Phosphatase: 53 U/L (ref 39–117)
BUN: 33 mg/dL — ABNORMAL HIGH (ref 6–23)
Calcium: 8.1 mg/dL — ABNORMAL LOW (ref 8.4–10.5)
Chloride: 105 mEq/L (ref 96–112)
Glucose, Bld: 197 mg/dL — ABNORMAL HIGH (ref 70–99)
Potassium: 3.7 mEq/L (ref 3.5–5.3)
Sodium: 140 mEq/L (ref 135–145)
Total Protein: 6.4 g/dL (ref 6.0–8.3)

## 2012-01-09 LAB — KAPPA/LAMBDA LIGHT CHAINS: Kappa:Lambda Ratio: 1.89 — ABNORMAL HIGH (ref 0.26–1.65)

## 2012-01-09 NOTE — Progress Notes (Signed)
Dr Cyndie Chime notified pt of CR results by phone. dph

## 2012-01-13 ENCOUNTER — Other Ambulatory Visit: Payer: Medicare Other | Admitting: Lab

## 2012-01-15 ENCOUNTER — Other Ambulatory Visit: Payer: Self-pay | Admitting: Oncology

## 2012-01-16 LAB — UIFE/LIGHT CHAINS/TP QN, 24-HR UR
Alpha 1, Urine: DETECTED — AB
Alpha 2, Urine: DETECTED — AB
Beta, Urine: DETECTED — AB
Free Kappa/Lambda Ratio: 9.1 ratio (ref 2.04–10.37)
Total Protein, Urine-Ur/day: 1599 mg/d — ABNORMAL HIGH (ref 10–140)
Total Protein, Urine: 62.7 mg/dL

## 2012-01-19 ENCOUNTER — Encounter: Payer: Self-pay | Admitting: *Deleted

## 2012-01-19 NOTE — Progress Notes (Signed)
RECEIVED A FAX FROM [employer] CONCERNING A CONFIRMATION OF PRESCRIPTION SHIPMENT FOR ALKERAN. 

## 2012-01-20 ENCOUNTER — Telehealth: Payer: Self-pay | Admitting: Oncology

## 2012-01-20 ENCOUNTER — Ambulatory Visit (HOSPITAL_BASED_OUTPATIENT_CLINIC_OR_DEPARTMENT_OTHER): Payer: Medicare Other | Admitting: Nurse Practitioner

## 2012-01-20 ENCOUNTER — Ambulatory Visit (HOSPITAL_BASED_OUTPATIENT_CLINIC_OR_DEPARTMENT_OTHER): Payer: Medicare Other

## 2012-01-20 ENCOUNTER — Other Ambulatory Visit (HOSPITAL_BASED_OUTPATIENT_CLINIC_OR_DEPARTMENT_OTHER): Payer: Medicare Other

## 2012-01-20 VITALS — BP 119/59 | HR 77 | Temp 96.9°F | Ht 66.0 in | Wt 262.0 lb

## 2012-01-20 DIAGNOSIS — C9002 Multiple myeloma in relapse: Secondary | ICD-10-CM

## 2012-01-20 DIAGNOSIS — N059 Unspecified nephritic syndrome with unspecified morphologic changes: Secondary | ICD-10-CM

## 2012-01-20 DIAGNOSIS — R609 Edema, unspecified: Secondary | ICD-10-CM

## 2012-01-20 DIAGNOSIS — C9000 Multiple myeloma not having achieved remission: Secondary | ICD-10-CM

## 2012-01-20 DIAGNOSIS — Z5112 Encounter for antineoplastic immunotherapy: Secondary | ICD-10-CM

## 2012-01-20 DIAGNOSIS — N049 Nephrotic syndrome with unspecified morphologic changes: Secondary | ICD-10-CM

## 2012-01-20 DIAGNOSIS — N08 Glomerular disorders in diseases classified elsewhere: Secondary | ICD-10-CM

## 2012-01-20 DIAGNOSIS — M8448XA Pathological fracture, other site, initial encounter for fracture: Secondary | ICD-10-CM

## 2012-01-20 DIAGNOSIS — D6959 Other secondary thrombocytopenia: Secondary | ICD-10-CM

## 2012-01-20 DIAGNOSIS — M545 Low back pain: Secondary | ICD-10-CM

## 2012-01-20 LAB — CBC WITH DIFFERENTIAL/PLATELET
BASO%: 0.8 % (ref 0.0–2.0)
EOS%: 2 % (ref 0.0–7.0)
MCH: 32 pg (ref 27.2–33.4)
MCHC: 33.2 g/dL (ref 32.0–36.0)
MCV: 96.4 fL (ref 79.3–98.0)
MONO%: 11.1 % (ref 0.0–14.0)
RBC: 3.06 10*6/uL — ABNORMAL LOW (ref 4.20–5.82)
RDW: 15.8 % — ABNORMAL HIGH (ref 11.0–14.6)
nRBC: 0 % (ref 0–0)

## 2012-01-20 MED ORDER — BORTEZOMIB CHEMO SQ INJECTION 3.5 MG (2.5MG/ML)
1.0000 mg/m2 | Freq: Once | INTRAMUSCULAR | Status: AC
  Administered 2012-01-20: 2.25 mg via SUBCUTANEOUS
  Filled 2012-01-20: qty 2.25

## 2012-01-20 NOTE — Patient Instructions (Signed)
Pt aware of next appt. Call if any problems 

## 2012-01-20 NOTE — Telephone Encounter (Signed)
appts made and printed for pt aom °

## 2012-01-20 NOTE — Progress Notes (Signed)
OFFICE PROGRESS NOTE  Interval history:  [name] is a 71 year old man with multiple myeloma. He is on active treatment with Velcade 3 weeks out of 4 and melphalan 4 days each month. 24-hour urine on 01/12/2012 showed further improvement in the total protein to 1.6 g as compared to 1.8 g on 11/16/2011 and 5.8 g on 03/25/2011. He is seen today for scheduled followup.  [name] reports his back pain is stable overall. He rarely takes pain medication. He denies nausea/vomiting. He "belches" intermittently. He tends to have loose stools after his evening meal. He denies numbness/tingling in his hands or feet.   Objective: Blood pressure 119/59, pulse 77, temperature 96.9 F (36.1 C), temperature source Oral, height 5\' 6"  (1.676 m), weight 262 lb (118.842 kg).  Oropharynx is without thrush. Lungs are clear. No wheezes or rales. Regular cardiac rhythm. Abdomen is soft and nontender. Obese. Chronic approximate 2+ firm edema at the lower legs bilaterally. Chronic venous stasis changes at the lower legs bilaterally. Motor strength 5 over 5.  Lab Results: Lab Results  Component Value Date   WBC 5.1 01/20/2012   HGB 9.8* 01/20/2012   HCT 29.5* 01/20/2012   MCV 96.4 01/20/2012   PLT 98* 01/20/2012    Chemistry:    Chemistry      Component Value Date/Time   NA 140 01/06/2012 1033   NA 137 07/07/2011 1217   K 3.7 01/06/2012 1033   K 4.8* 07/07/2011 1217   CL 105 01/06/2012 1033   CL 99 07/07/2011 1217   CO2 23 01/06/2012 1033   CO2 29 07/07/2011 1217   BUN 33* 01/06/2012 1033   BUN 41* 07/07/2011 1217   CREATININE 2.06* 01/06/2012 1033   CREATININE 2.1* 07/07/2011 1217   CREATININE 1.90* 03/25/2011 1008   CREATININE 1.90* 03/25/2011 1008   CREATININE 1.90* 03/25/2011 1008   CREATININE 1.90* 03/25/2011 1008      Component Value Date/Time   CALCIUM 8.1* 01/06/2012 1033   CALCIUM 8.7 07/07/2011 1217   ALKPHOS 53 01/06/2012 1033   AST 18 01/06/2012 1033   ALT 21 01/06/2012 1033   BILITOT 0.3 01/06/2012 1033        Studies/Results: Ct Thoracic Spine Wo Contrast  12/30/2011  *RADIOLOGY REPORT*  Clinical Data:  Multiple myeloma.  Increased back pain.  History of pathologic vertebral fracture.  CT THORACIC AND LUMBAR SPINE WITHOUT CONTRAST  Technique:  Multidetector CT imaging of the thoracic and lumbar spine was performed without contrast. Multiplanar CT image reconstructions were also generated.  Comparison:  CT of the lumbar spine without contrast 05/08/2008. Bone surveys 12/23/2011 and 05/27/2011.  CT THORACIC SPINE  Findings:  Prominent anterior osteophytes are evident throughout the thoracic spine.  A lytic lesion at T12 has eroded through the endplate.  There is compressed bone along the superior endplate of T12, suggesting a compression fracture.  A large hemangioma is present along the right side of T10.  A lytic lesion extends through the inferior endplate of T8 without collapse.  There is compressed bone along the superior endplate of T5 on the left which may represent an acute fracture associated with a lytic lesion. The osseous changes are evident within the right posterior second rib.  No other definite rib lesions are evident.  A superior endplate lesion at C7 is noted.  There is no significant retropulsion of bone or osseous foraminal narrowing.  Mild atelectasis present in the lungs.  There is some motion. Atherosclerotic calcifications are present within the aortic arch  and coronary arteries.  IMPRESSION:  1.  Superior endplate compression at T12 is new from the July 2012.  It is unclear whether this is healed or acute.  There certainly a large lytic lesion along the right side of the vertebral body which involves the superior endplate.  This is likely related to myeloma rather than a benign Schmorl's node. 2.  Lytic lesion along the right side of T10 is most compatible with a hemangioma. 3.  Large lytic lesion at T8 with erosion of the inferior endplate likely neoplastic. 4.  Superior endplate  compression fracture of T5, particularly on the left with compressed bone suggests this is likely a more acute pathologic fracture. 5.  No retropulsed bone or significant foraminal or canal stenosis. 6.  Prominent anterior osteophytes throughout.  CT LUMBAR SPINE  Findings: The patient is status post fusion across the collapsed L2 vertebral body.  Fusion has progressed and is now mature.  There is some posterior bone formation associated with initial retropulsion. The patient is status post L2 laminectomy.  A remote L4 compression fractures stable.  No new lumbar compression fractures are present. There is new central erosion posteriorly within the L1 vertebral body extending to the superior endplate, compatible with a focal myelomatous lesion.  Lucency about the L5 pedicle screws bilaterally is stable.  Bilateral foraminotomies have been performed at L1-2 and L2-3. There is some bone spurring on the right which could still contribute to foraminal stenosis.  This has progressed since the prior exam.  Facet disease is present at L3-4 and L4-5 contribute to mild foraminal narrowing at these levels.  Facet disease is present L5-S1 with mild narrowing bilaterally at that level.  Degenerative changes are noted in the SI joints bilaterally.  Atherosclerotic calcifications are again noted in the aorta branch vessels without aneurysm.  IMPRESSION:  1.  New lytic lesion centrally and extending to the superior endplate of L1 without pathologic collapse retropulsion. 2.  Progressive fusion at L1-2 and L2-3 with residual retropulsed bone but no central canal stenosis. 3.  Ossification on the right may impact the L2-3 foramen on the right.  The left sided foramina at L1-2 and L2-3 are patent. 4.  Stable remote compression fracture L4. 5.  Stable lucency surrounding the pedicle screws at L5. 6.  Atherosclerosis.  Original Report Authenticated By: Jamesetta Orleans. MATTERN, M.D.   Ct Lumbar Spine Wo Contrast  12/30/2011   *RADIOLOGY REPORT*  Clinical Data:  Multiple myeloma.  Increased back pain.  History of pathologic vertebral fracture.  CT THORACIC AND LUMBAR SPINE WITHOUT CONTRAST  Technique:  Multidetector CT imaging of the thoracic and lumbar spine was performed without contrast. Multiplanar CT image reconstructions were also generated.  Comparison:  CT of the lumbar spine without contrast 05/08/2008. Bone surveys 12/23/2011 and 05/27/2011.  CT THORACIC SPINE  Findings:  Prominent anterior osteophytes are evident throughout the thoracic spine.  A lytic lesion at T12 has eroded through the endplate.  There is compressed bone along the superior endplate of T12, suggesting a compression fracture.  A large hemangioma is present along the right side of T10.  A lytic lesion extends through the inferior endplate of T8 without collapse.  There is compressed bone along the superior endplate of T5 on the left which may represent an acute fracture associated with a lytic lesion. The osseous changes are evident within the right posterior second rib.  No other definite rib lesions are evident.  A superior endplate lesion at C7 is noted.  There is no significant retropulsion of bone or osseous foraminal narrowing.  Mild atelectasis present in the lungs.  There is some motion. Atherosclerotic calcifications are present within the aortic arch and coronary arteries.  IMPRESSION:  1.  Superior endplate compression at T12 is new from the July 2012.  It is unclear whether this is healed or acute.  There certainly a large lytic lesion along the right side of the vertebral body which involves the superior endplate.  This is likely related to myeloma rather than a benign Schmorl's node. 2.  Lytic lesion along the right side of T10 is most compatible with a hemangioma. 3.  Large lytic lesion at T8 with erosion of the inferior endplate likely neoplastic. 4.  Superior endplate compression fracture of T5, particularly on the left with compressed bone  suggests this is likely a more acute pathologic fracture. 5.  No retropulsed bone or significant foraminal or canal stenosis. 6.  Prominent anterior osteophytes throughout.  CT LUMBAR SPINE  Findings: The patient is status post fusion across the collapsed L2 vertebral body.  Fusion has progressed and is now mature.  There is some posterior bone formation associated with initial retropulsion. The patient is status post L2 laminectomy.  A remote L4 compression fractures stable.  No new lumbar compression fractures are present. There is new central erosion posteriorly within the L1 vertebral body extending to the superior endplate, compatible with a focal myelomatous lesion.  Lucency about the L5 pedicle screws bilaterally is stable.  Bilateral foraminotomies have been performed at L1-2 and L2-3. There is some bone spurring on the right which could still contribute to foraminal stenosis.  This has progressed since the prior exam.  Facet disease is present at L3-4 and L4-5 contribute to mild foraminal narrowing at these levels.  Facet disease is present L5-S1 with mild narrowing bilaterally at that level.  Degenerative changes are noted in the SI joints bilaterally.  Atherosclerotic calcifications are again noted in the aorta branch vessels without aneurysm.  IMPRESSION:  1.  New lytic lesion centrally and extending to the superior endplate of L1 without pathologic collapse retropulsion. 2.  Progressive fusion at L1-2 and L2-3 with residual retropulsed bone but no central canal stenosis. 3.  Ossification on the right may impact the L2-3 foramen on the right.  The left sided foramina at L1-2 and L2-3 are patent. 4.  Stable remote compression fracture L4. 5.  Stable lucency surrounding the pedicle screws at L5. 6.  Atherosclerosis.  Original Report Authenticated By: Jamesetta Orleans. MATTERN, M.D.   Dg Bone Survey Met  12/23/2011  *RADIOLOGY REPORT*  Clinical Data: History of multiple myeloma  METASTATIC BONE SURVEY   Comparison: Metastatic bone survey of 05/27/2011  Findings: A lateral view skull shows no change in the well circumscribed lucent lesion within the right frontal parietal calvarium.  No new calvarial lesion is seen.  The cervical vertebrae are in normal alignment with diffuse degenerative change.  The thoracic vertebrae are unchanged in alignment with slight kyphosis.  Diffuse degenerative changes noted.  The lumbar vertebrae are slightly straightened alignment which is stable.  Hardware for posterior fusion from L1-L5 is stable although there does appear to be lucency around the screws at the L5 level suggesting loosening. The corpectomy at L2 appears stable. .  Two views the right shoulder show no acute abnormality.  No new lytic lesion is seen.  There has been healing of the abnormality within the right second rib.  Two views of the left  shoulder show mild degenerative change.  No new radiolucent lesion is seen.  Views of both left and right humerus show no abnormality.  In view of the pelvis shows a healed lesion involving the left iliac wing with sclerotic margins.  No new radiolucent lesion is seen.  The right femur is unremarkable.  The left femur also is unremarkable.  IMPRESSION:  1.  No new radiolucent lesion is seen.  No new compression deformity is noted. 2.  Healed lesion of the left iliac wing and healed lesion of the right second rib. 3.  Lucency around the fixation screws at the L5 level suggests screw loosening.  Original Report Authenticated By: Juline Patch, M.D.    Medications: I have reviewed the patient's current medications.  Assessment/Plan:  1. 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 Followup 24-hour urine on 08/01/2011 was improved. 24-hour urine collection 01/12/2012 was further improved. 2. Thrombocytopenia secondary to chemotherapy. 3. Chronic nephrotic syndrome. 4. Peripheral edema secondary to  chronic nephrotic syndrome. Venous Doppler of the right lower extremity was negative November 2012. 5. Essential hypertension. 6. Insulin-dependent diabetes. 7. Obesity. 8. Acyclovir prophylaxis while on Velcade. 9. Hospitalization 10/04/2011 through 10/06/2011 with probable acute viral illness. 10. Escherichia coli and Morganella morganii urinary tract infection November 2012. He completed a course of Septra DS. 11. Chronic back pain. 12. Bone survey 12/23/2011 with no new radiolucent lesions seen. No new compression deformity noted. Healed lesion of the left iliac wing and healed lesion of the right second rib. Lucency around the fixation screws at the L5 level suggestive of screw loosening. 13. CT thoracic and lumbar spine 12/30/2011 with new superior endplate compression at T12; a large lytic lesion along the right side of the vertebral body involving the superior endplate; a lytic lesion along the right side of T10 most compatible with a hemangioma; large lytic lesion at T8 with erosion of the inferior endplate likely neoplastic; superior endplate compression fracture of T5; new lytic lesion centrally and extending to this superior endplate of L1 without pathologic collapse retropulsion; stable remote compression fracture at L4; stable lucency surrounding the pedicle screws at L5.  Disposition-[name] appears stable. Plan to continue Velcade/Alkeran at the current dose and schedule. He will return for a followup visit with Dr. Cyndie Chime in one month. He will contact the office in the interim with any problems.    Lonna Cobb ANP/GNP-BC

## 2012-01-27 ENCOUNTER — Other Ambulatory Visit: Payer: Self-pay | Admitting: Oncology

## 2012-01-27 ENCOUNTER — Ambulatory Visit (HOSPITAL_BASED_OUTPATIENT_CLINIC_OR_DEPARTMENT_OTHER): Payer: Medicare Other

## 2012-01-27 ENCOUNTER — Other Ambulatory Visit (HOSPITAL_BASED_OUTPATIENT_CLINIC_OR_DEPARTMENT_OTHER): Payer: Medicare Other

## 2012-01-27 VITALS — BP 143/74 | HR 75 | Temp 97.2°F

## 2012-01-27 DIAGNOSIS — Z5112 Encounter for antineoplastic immunotherapy: Secondary | ICD-10-CM

## 2012-01-27 DIAGNOSIS — C9002 Multiple myeloma in relapse: Secondary | ICD-10-CM

## 2012-01-27 DIAGNOSIS — N08 Glomerular disorders in diseases classified elsewhere: Secondary | ICD-10-CM

## 2012-01-27 DIAGNOSIS — C9000 Multiple myeloma not having achieved remission: Secondary | ICD-10-CM

## 2012-01-27 LAB — CBC WITH DIFFERENTIAL/PLATELET
BASO%: 0.3 % (ref 0.0–2.0)
EOS%: 2.5 % (ref 0.0–7.0)
HGB: 10.4 g/dL — ABNORMAL LOW (ref 13.0–17.1)
MCH: 32.1 pg (ref 27.2–33.4)
MCHC: 33.7 g/dL (ref 32.0–36.0)
MONO%: 7.5 % (ref 0.0–14.0)
RBC: 3.24 10*6/uL — ABNORMAL LOW (ref 4.20–5.82)
RDW: 15.8 % — ABNORMAL HIGH (ref 11.0–14.6)
lymph#: 0.6 10*3/uL — ABNORMAL LOW (ref 0.9–3.3)
nRBC: 0 % (ref 0–0)

## 2012-01-27 MED ORDER — ONDANSETRON HCL 8 MG PO TABS: 8.0000 mg | ORAL_TABLET | Freq: Once | ORAL | Status: DC

## 2012-01-27 MED ORDER — BORTEZOMIB CHEMO SQ INJECTION 3.5 MG (2.5MG/ML)
1.0000 mg/m2 | Freq: Once | INTRAMUSCULAR | Status: AC
  Administered 2012-01-27: 2.25 mg via SUBCUTANEOUS
  Filled 2012-01-27: qty 2.25

## 2012-01-27 NOTE — Patient Instructions (Signed)
[facility] Discharge Instructions for Patients Receiving Chemotherapy  Today you received the following chemotherapy agents  VELCADE  To help prevent nausea and vomiting after your treatment, we encourage you to take your nausea medication  Begin taking it at as often as prescribed fIf you develop nausea and vomiting that is not controlled by your nausea medication, call the clinic. If it is after clinic hours your family physician or the after hours number for the clinic or go to the Emergency Department.   BELOW ARE SYMPTOMS THAT SHOULD BE REPORTED IMMEDIATELY:  *FEVER GREATER THAN 100.5 F  *CHILLS WITH OR WITHOUT FEVER  NAUSEA AND VOMITING THAT IS NOT CONTROLLED WITH YOUR NAUSEA MEDICATION  *UNUSUAL SHORTNESS OF BREATH  *UNUSUAL BRUISING OR BLEEDING  TENDERNESS IN MOUTH AND THROAT WITH OR WITHOUT PRESENCE OF ULCERS  *URINARY PROBLEMS  *BOWEL PROBLEMS  UNUSUAL RASH Items with * indicate a potential emergency and should be followed up as soon as possible.  One of the nurses will contact you 24 hours after your treatment. Please let the nurse know about any problems that you may have experienced. Feel free to call the clinic you have any questions or concerns. The clinic phone number is [phone].   I have been informed and understand all the instructions given to me. I know to contact the clinic, my physician, or go to the Emergency Department if any problems should occur. I do not have any questions at this time, but understand that I may call the clinic during office hours   should I have any questions or need assistance in obtaining follow up care.    __________________________________________  _____________  __________ Signature of Patient or Authorized Representative            Date                   Time    __________________________________________ Nurse's Signature

## 2012-01-29 ENCOUNTER — Other Ambulatory Visit: Payer: Self-pay | Admitting: Oncology

## 2012-02-03 ENCOUNTER — Ambulatory Visit (HOSPITAL_BASED_OUTPATIENT_CLINIC_OR_DEPARTMENT_OTHER): Payer: Medicare Other

## 2012-02-03 ENCOUNTER — Other Ambulatory Visit (HOSPITAL_BASED_OUTPATIENT_CLINIC_OR_DEPARTMENT_OTHER): Payer: Medicare Other

## 2012-02-03 VITALS — BP 126/73 | HR 80 | Temp 97.6°F

## 2012-02-03 DIAGNOSIS — N08 Glomerular disorders in diseases classified elsewhere: Secondary | ICD-10-CM

## 2012-02-03 DIAGNOSIS — Z5112 Encounter for antineoplastic immunotherapy: Secondary | ICD-10-CM

## 2012-02-03 DIAGNOSIS — C9002 Multiple myeloma in relapse: Secondary | ICD-10-CM

## 2012-02-03 DIAGNOSIS — C9000 Multiple myeloma not having achieved remission: Secondary | ICD-10-CM

## 2012-02-03 LAB — CBC WITH DIFFERENTIAL/PLATELET
BASO%: 0.3 % (ref 0.0–2.0)
Eosinophils Absolute: 0.2 10*3/uL (ref 0.0–0.5)
MCHC: 33.7 g/dL (ref 32.0–36.0)
MONO#: 0.4 10*3/uL (ref 0.1–0.9)
NEUT#: 3.6 10*3/uL (ref 1.5–6.5)
RBC: 3.05 10*6/uL — ABNORMAL LOW (ref 4.20–5.82)
RDW: 17.2 % — ABNORMAL HIGH (ref 11.0–14.6)
WBC: 4.6 10*3/uL (ref 4.0–10.3)
nRBC: 0 % (ref 0–0)

## 2012-02-03 MED ORDER — ONDANSETRON HCL 8 MG PO TABS
8.0000 mg | ORAL_TABLET | Freq: Once | ORAL | Status: AC
  Administered 2012-02-03: 8 mg via ORAL

## 2012-02-03 MED ORDER — BORTEZOMIB CHEMO SQ INJECTION 3.5 MG (2.5MG/ML)
1.0000 mg/m2 | Freq: Once | INTRAMUSCULAR | Status: AC
  Administered 2012-02-03: 2.25 mg via SUBCUTANEOUS
  Filled 2012-02-03: qty 2.25

## 2012-02-10 ENCOUNTER — Other Ambulatory Visit: Payer: Self-pay | Admitting: Oncology

## 2012-02-16 ENCOUNTER — Encounter: Payer: Self-pay | Admitting: *Deleted

## 2012-02-16 NOTE — Progress Notes (Signed)
RECEIVED A FAX FROM [employer] CONCERNING A CONFIRMATION OF PRESCRIPTION SHIPMENT FOR ALKERAN. 

## 2012-02-17 ENCOUNTER — Ambulatory Visit (HOSPITAL_BASED_OUTPATIENT_CLINIC_OR_DEPARTMENT_OTHER): Payer: Medicare Other

## 2012-02-17 ENCOUNTER — Other Ambulatory Visit (HOSPITAL_BASED_OUTPATIENT_CLINIC_OR_DEPARTMENT_OTHER): Payer: Medicare Other | Admitting: Lab

## 2012-02-17 ENCOUNTER — Telehealth: Payer: Self-pay | Admitting: Oncology

## 2012-02-17 ENCOUNTER — Ambulatory Visit (HOSPITAL_BASED_OUTPATIENT_CLINIC_OR_DEPARTMENT_OTHER): Payer: Medicare Other | Admitting: Oncology

## 2012-02-17 VITALS — BP 109/59 | HR 90 | Temp 97.5°F | Ht 66.0 in | Wt 260.7 lb

## 2012-02-17 DIAGNOSIS — N08 Glomerular disorders in diseases classified elsewhere: Secondary | ICD-10-CM

## 2012-02-17 DIAGNOSIS — N049 Nephrotic syndrome with unspecified morphologic changes: Secondary | ICD-10-CM

## 2012-02-17 DIAGNOSIS — E119 Type 2 diabetes mellitus without complications: Secondary | ICD-10-CM

## 2012-02-17 DIAGNOSIS — Z5112 Encounter for antineoplastic immunotherapy: Secondary | ICD-10-CM

## 2012-02-17 DIAGNOSIS — C9000 Multiple myeloma not having achieved remission: Secondary | ICD-10-CM

## 2012-02-17 DIAGNOSIS — C9002 Multiple myeloma in relapse: Secondary | ICD-10-CM

## 2012-02-17 DIAGNOSIS — I10 Essential (primary) hypertension: Secondary | ICD-10-CM

## 2012-02-17 LAB — CBC WITH DIFFERENTIAL/PLATELET
Basophils Absolute: 0 10*3/uL (ref 0.0–0.1)
Eosinophils Absolute: 0.1 10*3/uL (ref 0.0–0.5)
HCT: 30.3 % — ABNORMAL LOW (ref 38.4–49.9)
HGB: 10.3 g/dL — ABNORMAL LOW (ref 13.0–17.1)
MCV: 98.1 fL — ABNORMAL HIGH (ref 79.3–98.0)
MONO%: 10.2 % (ref 0.0–14.0)
NEUT#: 4 10*3/uL (ref 1.5–6.5)
NEUT%: 80.2 % — ABNORMAL HIGH (ref 39.0–75.0)
RDW: 17.1 % — ABNORMAL HIGH (ref 11.0–14.6)
lymph#: 0.4 10*3/uL — ABNORMAL LOW (ref 0.9–3.3)

## 2012-02-17 MED ORDER — ONDANSETRON HCL 8 MG PO TABS: 8.0000 mg | ORAL_TABLET | Freq: Once | ORAL | Status: DC

## 2012-02-17 MED ORDER — BORTEZOMIB CHEMO SQ INJECTION 3.5 MG (2.5MG/ML)
1.0000 mg/m2 | Freq: Once | INTRAMUSCULAR | Status: AC
  Administered 2012-02-17: 2.25 mg via SUBCUTANEOUS
  Filled 2012-02-17: qty 2.25

## 2012-02-17 NOTE — Progress Notes (Signed)
Patient declines his po Zofran--takes his premed at home

## 2012-02-17 NOTE — Telephone Encounter (Signed)
Gv pt appt for april-may2013 

## 2012-02-17 NOTE — Progress Notes (Signed)
Hematology and Oncology Follow Up Visit  William Gaines [id number] 06/04/1941 71 y.o. 02/17/2012 6:14 PM   Principle Diagnosis: Encounter Diagnosis  Name Primary?  . Multiple myeloma in relapse Yes     Interim History:   Followup visit for this  71 year old man with kappa light chain multiple myeloma complicated by previous diagnosis of nephrotic syndrome, insulin-dependent diabetes, and chronic renal insufficiency. Initial presentation of his myeloma with a pathologic fracture of his spine in November 2007. treated with radiation and subsequent surgical stabilization procedure. Approximate 6 months of initial treatment with Revlimid dexamethasone with a nice response. Multiple complications with the dexamethasone with infection and uncontrolled sugars. He was in a plateau phase with stable paraprotein levels and urine proteins until he developed a new lytic lesion of his left iliac bone treated with 2000 cGy of radiation by Dr. Margaretmary Dys between March 8 and 01/26/2011. Urine and serum protein levels began to rise. He was put back on a hemotherapy program with a combination of Velcade initially 1.3 subsequently decreased to 1 mg per meter squared weekly x31 week rest with oral melphalan initially at 7 mg per meter squared total dose 14 mg 4 days each 28 day cycle and subsequently decreased to 4 mg per meter squared total dose 8 mg 4 days in around each cycle. This program was started 06/19/2011.  Once again we have seen a response with a plateau in his lab parameters. 24-hour urine total protein was 5.8 g back in may now down to 1.8 g as of 11/16/2011 1559 mg as a March 7 This is nonselective proteinuria with no monoclonal protein seen on immunofixation electrophoresis. Serum kappa free light chains have fallen from 61 mg percent on 05/27/2011 to 8.1 mg percent as of 11/04/2011.   at time of his February 15 visit here he was complaining of increasing low back pain. I obtained a skeletal bone  survey which did not show any obvious new lesions. However a CT scan of his spine was read as showing a superior endplate lesion at C7, superior endplate compression at T12 which was new, there was a large lytic lesion along the right side of the vertebral body at that level. Not clear that this lesion was new. A large lytic lesion at T8 with erosion of the inferior endplate, a new central erosion within the L1 vertebral body and surgical changes at L5 from initial myeloma lesions in the lumbar spine at L1-L3. There was a new lytic lesion at L1. No pathologic collapse at that level. There are no areas of impending spinal cord compression.  Since last visit his back pain has subsided. When I asked him how much pain medication he was taking he said almost none. He may occasionally take one pain pill a day. He notes no new areas of pain.  Blood sugars have been stable. He has had no interim fever or infection.    Medications: reviewed  Allergies:  Allergies  Allergen Reactions  . Codeine     hallucinations  . Lorazepam (Ativan) Anxiety    Paradoxical agitation when given as pre med for bone marrow biopsy 09/11/06    Review of Systems: Constitutional:   No change in chronic fatigue Respiratory: No cough or dyspnea at rest Cardiovascular:  No chest pain or palpitation Gastrointestinal: No change in bowel habit Genito-Urinary: Not questioned Musculoskeletal: See above Neurologic: Intermittent headaches unchanged. Skin: No rash or ecchymosis Remaining ROS negative.  Physical Exam: Blood pressure 109/59, pulse 90, temperature 97.5  F (36.4 C), temperature source Oral, height 5\' 6"  (1.676 m), weight 260 lb 11.2 oz (118.253 kg). Wt Readings from Last 3 Encounters:  02/17/12 260 lb 11.2 oz (118.253 kg)  01/20/12 262 lb (118.842 kg)  12/23/11 259 lb 11.2 oz (117.799 kg)     General appearance: Obese Caucasian man HENNT: Pharynx no erythema exudate or ulcer Lymph nodes: No  adenopathy Breasts: Lungs: Clear to auscultation resonant to percussion no murmur Heart: Regular rhythm no murmur Abdomen: Soft obese nontender large umbilicus hernia Extremities: Chronic brawny edema but much better than on previous exams Vascular: No cyanosis Neurologic: Motor strength remains 5 over 5 all extremities reflexes 1+ symmetric Skin: No rash or ecchymosis  Lab Results: Lab Results  Component Value Date   WBC 5.0 02/17/2012   HGB 10.3* 02/17/2012   HCT 30.3* 02/17/2012   MCV 98.1* 02/17/2012   PLT 97* 02/17/2012     Chemistry      Component Value Date/Time   NA 140 02/17/2012 1009   NA 137 07/07/2011 1217   K 3.8 02/17/2012 1009   K 4.8* 07/07/2011 1217   CL 103 02/17/2012 1009   CL 99 07/07/2011 1217   CO2 25 02/17/2012 1009   CO2 29 07/07/2011 1217   BUN 45* 02/17/2012 1009   BUN 41* 07/07/2011 1217   CREATININE 2.19* 02/17/2012 1009   CREATININE 2.1* 07/07/2011 1217   CREATININE 1.90* 03/25/2011 1008   CREATININE 1.90* 03/25/2011 1008   CREATININE 1.90* 03/25/2011 1008   CREATININE 1.90* 03/25/2011 1008      Component Value Date/Time   CALCIUM 8.8 02/17/2012 1009   CALCIUM 8.7 07/07/2011 1217   ALKPHOS 54 02/17/2012 1009   AST 19 02/17/2012 1009   ALT 24 02/17/2012 1009   BILITOT 0.3 02/17/2012 1009       Radiological Studies: See discussion above   Impression and Plan: #1. Kappa light chain multiple myeloma versus nonsecretory myeloma Despite apparent new changes reported on the CT scan of his spine at multiple levels, the comparison study was a CT scan done 4 years ago in July of 2009. Although he has  fluctuations in his lower back symptoms, for now they have subsided and he has no new areas of pain. There are no gross new lesions on skeletal bone survey and all of his lab parameters are stable to improved. I believe that he is in a partial remission on his current regimen and plan to continue the same regimen to complete one year of treatment this August and then  reevaluate again. I am hoping we can give him a drug holiday at that time.  #2. Long-standing nephrotic syndrome likely due to hypertension and diabetes  #3. Essential hypertension.  #4. Insulin-dependent diabetes.     CC:.    Levert Feinstein, MD 4/12/20136:14 PM

## 2012-02-20 LAB — COMPREHENSIVE METABOLIC PANEL
Albumin: 3.5 g/dL (ref 3.5–5.2)
BUN: 45 mg/dL — ABNORMAL HIGH (ref 6–23)
CO2: 25 mEq/L (ref 19–32)
Calcium: 8.8 mg/dL (ref 8.4–10.5)
Glucose, Bld: 204 mg/dL — ABNORMAL HIGH (ref 70–99)
Potassium: 3.8 mEq/L (ref 3.5–5.3)
Sodium: 140 mEq/L (ref 135–145)
Total Protein: 6.7 g/dL (ref 6.0–8.3)

## 2012-02-20 LAB — KAPPA/LAMBDA LIGHT CHAINS: Kappa free light chain: 6.9 mg/dL — ABNORMAL HIGH (ref 0.33–1.94)

## 2012-02-20 LAB — LACTATE DEHYDROGENASE: LDH: 79 U/L — ABNORMAL LOW (ref 94–250)

## 2012-02-24 ENCOUNTER — Other Ambulatory Visit (HOSPITAL_BASED_OUTPATIENT_CLINIC_OR_DEPARTMENT_OTHER): Payer: Medicare Other

## 2012-02-24 ENCOUNTER — Other Ambulatory Visit: Payer: Self-pay | Admitting: Oncology

## 2012-02-24 ENCOUNTER — Ambulatory Visit (HOSPITAL_BASED_OUTPATIENT_CLINIC_OR_DEPARTMENT_OTHER): Payer: Medicare Other

## 2012-02-24 VITALS — BP 155/80 | HR 71 | Temp 98.3°F

## 2012-02-24 DIAGNOSIS — Z5112 Encounter for antineoplastic immunotherapy: Secondary | ICD-10-CM

## 2012-02-24 DIAGNOSIS — C9002 Multiple myeloma in relapse: Secondary | ICD-10-CM

## 2012-02-24 DIAGNOSIS — C9000 Multiple myeloma not having achieved remission: Secondary | ICD-10-CM

## 2012-02-24 LAB — CBC WITH DIFFERENTIAL/PLATELET
Basophils Absolute: 0 10*3/uL (ref 0.0–0.1)
EOS%: 2.7 % (ref 0.0–7.0)
Eosinophils Absolute: 0.1 10*3/uL (ref 0.0–0.5)
HCT: 30 % — ABNORMAL LOW (ref 38.4–49.9)
HGB: 10 g/dL — ABNORMAL LOW (ref 13.0–17.1)
MCH: 32.7 pg (ref 27.2–33.4)
MCV: 98.2 fL — ABNORMAL HIGH (ref 79.3–98.0)
NEUT#: 3.8 10*3/uL (ref 1.5–6.5)
NEUT%: 81.5 % — ABNORMAL HIGH (ref 39.0–75.0)
lymph#: 0.4 10*3/uL — ABNORMAL LOW (ref 0.9–3.3)

## 2012-02-24 MED ORDER — ZOLEDRONIC ACID 4 MG/100ML IV SOLN
4.0000 mg | Freq: Once | INTRAVENOUS | Status: AC
  Administered 2012-02-24: 4 mg via INTRAVENOUS
  Filled 2012-02-24: qty 100

## 2012-02-24 MED ORDER — BORTEZOMIB CHEMO SQ INJECTION 3.5 MG (2.5MG/ML)
1.0000 mg/m2 | Freq: Once | INTRAMUSCULAR | Status: AC
  Administered 2012-02-24: 2.25 mg via SUBCUTANEOUS
  Filled 2012-02-24: qty 2.25

## 2012-02-27 ENCOUNTER — Other Ambulatory Visit: Payer: Self-pay | Admitting: Certified Registered Nurse Anesthetist

## 2012-02-29 ENCOUNTER — Other Ambulatory Visit: Payer: Self-pay

## 2012-03-02 ENCOUNTER — Ambulatory Visit (HOSPITAL_BASED_OUTPATIENT_CLINIC_OR_DEPARTMENT_OTHER): Payer: Medicare Other

## 2012-03-02 ENCOUNTER — Other Ambulatory Visit (HOSPITAL_BASED_OUTPATIENT_CLINIC_OR_DEPARTMENT_OTHER): Payer: Medicare Other | Admitting: Lab

## 2012-03-02 VITALS — BP 126/59 | HR 82 | Temp 97.2°F

## 2012-03-02 DIAGNOSIS — C9000 Multiple myeloma not having achieved remission: Secondary | ICD-10-CM

## 2012-03-02 DIAGNOSIS — N08 Glomerular disorders in diseases classified elsewhere: Secondary | ICD-10-CM

## 2012-03-02 DIAGNOSIS — C9002 Multiple myeloma in relapse: Secondary | ICD-10-CM

## 2012-03-02 DIAGNOSIS — Z5112 Encounter for antineoplastic immunotherapy: Secondary | ICD-10-CM

## 2012-03-02 LAB — CBC WITH DIFFERENTIAL/PLATELET
Basophils Absolute: 0 10*3/uL (ref 0.0–0.1)
Eosinophils Absolute: 0.1 10*3/uL (ref 0.0–0.5)
HCT: 29.9 % — ABNORMAL LOW (ref 38.4–49.9)
LYMPH%: 9.3 % — ABNORMAL LOW (ref 14.0–49.0)
MCV: 98.5 fL — ABNORMAL HIGH (ref 79.3–98.0)
MONO#: 0.4 10*3/uL (ref 0.1–0.9)
MONO%: 8.6 % (ref 0.0–14.0)
NEUT#: 3.6 10*3/uL (ref 1.5–6.5)
NEUT%: 78.5 % — ABNORMAL HIGH (ref 39.0–75.0)
Platelets: 85 10*3/uL — ABNORMAL LOW (ref 140–400)
RBC: 3.04 10*6/uL — ABNORMAL LOW (ref 4.20–5.82)
nRBC: 0 % (ref 0–0)

## 2012-03-02 MED ORDER — ONDANSETRON HCL 8 MG PO TABS: 8.0000 mg | ORAL_TABLET | Freq: Once | ORAL | Status: DC

## 2012-03-02 MED ORDER — BORTEZOMIB CHEMO SQ INJECTION 3.5 MG (2.5MG/ML)
1.0000 mg/m2 | Freq: Once | INTRAMUSCULAR | Status: AC
  Administered 2012-03-02: 2.25 mg via SUBCUTANEOUS
  Filled 2012-03-02: qty 2.25

## 2012-03-02 NOTE — Progress Notes (Signed)
Verified with Lonna Cobb, NP: OK to treat today with platelet count at 85. Has been treated with count in 70's in past. No bleeding or unusual bruising

## 2012-03-05 ENCOUNTER — Ambulatory Visit: Payer: Medicare Other

## 2012-03-05 LAB — COMPREHENSIVE METABOLIC PANEL
BUN: 42 mg/dL — ABNORMAL HIGH (ref 6–23)
CO2: 24 mEq/L (ref 19–32)
Calcium: 8.5 mg/dL (ref 8.4–10.5)
Chloride: 106 mEq/L (ref 96–112)
Creatinine, Ser: 2.34 mg/dL — ABNORMAL HIGH (ref 0.50–1.35)
Glucose, Bld: 234 mg/dL — ABNORMAL HIGH (ref 70–99)
Total Bilirubin: 0.3 mg/dL (ref 0.3–1.2)

## 2012-03-05 LAB — IGG, IGA, IGM
IgA: 354 mg/dL (ref 68–379)
IgG (Immunoglobin G), Serum: 1550 mg/dL (ref 650–1600)
IgM, Serum: 30 mg/dL — ABNORMAL LOW (ref 41–251)

## 2012-03-05 LAB — KAPPA/LAMBDA LIGHT CHAINS
Kappa free light chain: 5.57 mg/dL — ABNORMAL HIGH (ref 0.33–1.94)
Lambda Free Lght Chn: 2.87 mg/dL — ABNORMAL HIGH (ref 0.57–2.63)

## 2012-03-07 LAB — UIFE/LIGHT CHAINS/TP QN, 24-HR UR
Albumin, U: DETECTED
Alpha 1, Urine: DETECTED — AB
Alpha 2, Urine: DETECTED — AB
Free Kappa Lt Chains,Ur: 9.22 mg/dL — ABNORMAL HIGH (ref 0.14–2.42)
Free Kappa/Lambda Ratio: 11.38 ratio — ABNORMAL HIGH (ref 2.04–10.37)
Gamma Globulin, Urine: DETECTED — AB

## 2012-03-13 ENCOUNTER — Ambulatory Visit (HOSPITAL_BASED_OUTPATIENT_CLINIC_OR_DEPARTMENT_OTHER): Payer: Medicare Other | Admitting: Oncology

## 2012-03-13 ENCOUNTER — Telehealth: Payer: Self-pay | Admitting: Oncology

## 2012-03-13 ENCOUNTER — Other Ambulatory Visit (HOSPITAL_BASED_OUTPATIENT_CLINIC_OR_DEPARTMENT_OTHER): Payer: Medicare Other | Admitting: Lab

## 2012-03-13 VITALS — BP 138/66 | HR 68 | Temp 97.1°F | Ht 66.0 in | Wt 264.6 lb

## 2012-03-13 DIAGNOSIS — C9000 Multiple myeloma not having achieved remission: Secondary | ICD-10-CM

## 2012-03-13 DIAGNOSIS — C9001 Multiple myeloma in remission: Secondary | ICD-10-CM

## 2012-03-13 DIAGNOSIS — M8448XA Pathological fracture, other site, initial encounter for fracture: Secondary | ICD-10-CM

## 2012-03-13 DIAGNOSIS — M545 Low back pain: Secondary | ICD-10-CM

## 2012-03-13 DIAGNOSIS — I10 Essential (primary) hypertension: Secondary | ICD-10-CM

## 2012-03-13 DIAGNOSIS — E119 Type 2 diabetes mellitus without complications: Secondary | ICD-10-CM

## 2012-03-13 DIAGNOSIS — N049 Nephrotic syndrome with unspecified morphologic changes: Secondary | ICD-10-CM

## 2012-03-13 LAB — CBC WITH DIFFERENTIAL/PLATELET
BASO%: 0.5 % (ref 0.0–2.0)
HCT: 30.6 % — ABNORMAL LOW (ref 38.4–49.9)
HGB: 10.3 g/dL — ABNORMAL LOW (ref 13.0–17.1)
MCHC: 33.7 g/dL (ref 32.0–36.0)
MONO#: 0.5 10*3/uL (ref 0.1–0.9)
NEUT%: 76.8 % — ABNORMAL HIGH (ref 39.0–75.0)
WBC: 4.8 10*3/uL (ref 4.0–10.3)
lymph#: 0.5 10*3/uL — ABNORMAL LOW (ref 0.9–3.3)

## 2012-03-13 NOTE — Telephone Encounter (Signed)
gv pt appt schedule for may/june. Pt aware LT is out 6/7 and message will be sent to [facility] re d/t for f/u. Pt will get new schedule when he comes back on Friday 5/10.

## 2012-03-14 NOTE — Progress Notes (Signed)
Hematology and Oncology Follow Up Visit  William Gaines [id number] [birth year] 71 y.o. 03/14/2012 7:45 PM   Principle Diagnosis: Encounter Diagnosis  Name Primary?  . 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 Please see my April 12 note for full details of previous diagnosis and treatments. He is currently receiving a combination of oral Alkeran plus subcutaneous Velcade started in August of 2012. He has had a partial response to treatment. Recent MRI studies of his spine continue to show active bone disease. In addition to the chemotherapy he is also receiving monthly Zometa infusions. The level of his back pain is stable and he reports no new areas of pain. No interim fever or infection. He is now on 80 mg of Lasix daily with a nice reduction in his peripheral edema.  He has no new complaints today.  Medications: reviewed  Allergies:  Allergies  Allergen Reactions  . Codeine     hallucinations  . Lorazepam (Lorazepam) Anxiety    Paradoxical agitation when given as pre med for bone marrow biopsy 09/11/06    Review of Systems: Constitutional:   Chronic fatigue but he remains active Respiratory: No cough or dyspnea Cardiovascular:  No chest pain or palpitations Gastrointestinal: No abdominal pain Genito-Urinary: No urinary tract symptoms Musculoskeletal: See above Neurologic: No headache or change in vision Skin: No rash or ecchymoses Remaining ROS negative.  Physical Exam: Blood pressure 138/66, pulse 68, temperature 97.1 F (36.2 C), temperature source Oral, height 5\' 6"  (1.676 m), weight 264 lb 9.6 oz (120.022 kg). Wt Readings from Last 3 Encounters:  03/13/12 264 lb 9.6 oz (120.022 kg)  02/17/12 260 lb 11.2 oz (118.253 kg)  01/20/12 262 lb (118.842 kg)       General appearance: Well-nourished Caucasian man HENNT: Pharynx no erythema exudate or ulcer Lymph nodes: No adenopathy Breasts: Lungs: Clear to auscultation resonant to percussion Heart: Regular rhythm no murmur Abdomen: Soft nontender large ventral hernia and umbilical hernia Extremities: 1+ edema down from 2-3+ in past Vascular: No cyanosis Neurologic: Motor strength 5 over 5 reflexes 1+ symmetric sensation intact to vibration over the fingertips by tuning fork exam Skin: No rash or ecchymosis  Lab Results: Lab Results  Component Value Date   WBC 4.8 03/13/2012   HGB 10.3* 03/13/2012   HCT 30.6* 03/13/2012   MCV 98.0 03/13/2012   PLT 97* 03/13/2012     Chemistry      Component Value Date/Time   NA 137 03/02/2012 1223   NA 137 07/07/2011 1217   K 3.8 03/02/2012 1223   K 4.8* 07/07/2011 1217   CL 106 03/02/2012 1223   CL 99 07/07/2011 1217   CO2 24 03/02/2012 1223   CO2 29 07/07/2011 1217   BUN 42* 03/02/2012 1223   BUN 41* 07/07/2011 1217   CREATININE 2.34* 03/02/2012 1223   CREATININE 2.1* 07/07/2011 1217   CREATININE 1.90* 03/25/2011 1008   CREATININE 1.90* 03/25/2011 1008   CREATININE 1.90* 03/25/2011 1008   CREATININE 1.90* 03/25/2011 1008      Component Value Date/Time   CALCIUM 8.5 03/02/2012 1223   CALCIUM 8.7 07/07/2011 1217   ALKPHOS 59 03/02/2012 1223   AST 20 03/02/2012 1223   ALT 28 03/02/2012 1223   BILITOT 0.3 03/02/2012 1223       Impression and Plan: #1. 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 Partial response to current therapy and currently in a plateau phase. I will continue this program through August to complete one year treatment and then negotiate whether or not to put him on maintenance therapy with low-dose oral Revlimid.  #2. Nephrotic syndrome which I feel is primarily due to hypertension and diabetes. Kappa lambda light chain ratio just borderline elevated suggesting primarily medical renal disease and not light chain myeloma as etiology of his  kidney disease.  #3. Insulin-dependent diabetes  #4. Obstructive airway disease  #5. Essential hypertension    CC:.    Levert Feinstein, MD 5/8/20137:45 PM

## 2012-03-15 ENCOUNTER — Telehealth: Payer: Self-pay | Admitting: *Deleted

## 2012-03-15 ENCOUNTER — Other Ambulatory Visit: Payer: Self-pay | Admitting: *Deleted

## 2012-03-15 NOTE — Telephone Encounter (Signed)
Received fax from [employer] stating alkeran has been shipped on 03/14/12 & scheduled for delivery next business day.

## 2012-03-15 NOTE — Telephone Encounter (Signed)
Confirmation that Alkeran was shipped 03/14/12.

## 2012-03-16 ENCOUNTER — Other Ambulatory Visit: Payer: Medicare Other

## 2012-03-16 ENCOUNTER — Ambulatory Visit: Payer: Medicare Other

## 2012-03-16 ENCOUNTER — Encounter: Payer: Self-pay | Admitting: *Deleted

## 2012-03-16 ENCOUNTER — Telehealth: Payer: Self-pay | Admitting: *Deleted

## 2012-03-16 NOTE — Telephone Encounter (Signed)
Patient called and canceled his appts fro today. Per the patient, "I'm sick. I have caught some type of stomach bug. I will keep my appts for next Friday." Appts canceled and desk RN notified. JMW

## 2012-03-16 NOTE — Progress Notes (Unsigned)
PT REPORTS THAT HE "IS SICK AS A DOG" AND WILL NOT BE IN FOR HIS VALCADE INJECTION. PT REPORTS THAT HE HAS NO FEVER, BUT IS NAUSEATED AND HAS LOOSE STOOLS. THIS DESK NURSE SUGGESTS IMMODIUM AND TO TAKE ZOFRAN AS DIRECTED. PT STATES THAT HE IS DRINKING "WATER AND COKE" AND HAS EATEN SOME CRACKERS. PT WILL CALL IF SX PERSIST

## 2012-03-22 ENCOUNTER — Telehealth: Payer: Self-pay | Admitting: *Deleted

## 2012-03-22 ENCOUNTER — Telehealth: Payer: Self-pay | Admitting: Oncology

## 2012-03-22 NOTE — Telephone Encounter (Signed)
Received call from pt stating that he had a physical exam yest & had lab work & wondered if he needed to repeat labs tomorrow.  He reports that his WBC was OK but platelets were 89.  He was not scheduled for an appt for tomorrow although should have been.  Notified Michelle/Scheduler & r/s appt to 03/23/12 @ 2:15pm for labs & 2:45pm for chemo.  Will discuss need for lab with Dr Cyndie Chime & change in chemo dates.  He missed 03/16/12 chemo due to being sick.  He did not start his alkeran that day either.

## 2012-03-22 NOTE — Telephone Encounter (Signed)
Per email response from JG added f/u for 6/14 @ 4 pm. Per JG have pt do lb/tx that day prior to seeing him - he has not other availability. S/w pt today re appts for 6*14 will be lb/tx then JG. Pt expresses understanding and will get new schedule when he comes in for lb/infusion tomorrow.

## 2012-03-23 ENCOUNTER — Ambulatory Visit: Payer: Medicare Other

## 2012-03-23 ENCOUNTER — Ambulatory Visit (HOSPITAL_BASED_OUTPATIENT_CLINIC_OR_DEPARTMENT_OTHER): Payer: Medicare Other

## 2012-03-23 ENCOUNTER — Other Ambulatory Visit: Payer: Medicare Other | Admitting: Lab

## 2012-03-23 ENCOUNTER — Other Ambulatory Visit: Payer: Medicare Other

## 2012-03-23 ENCOUNTER — Other Ambulatory Visit: Payer: Self-pay | Admitting: Oncology

## 2012-03-23 VITALS — BP 147/74 | HR 96 | Temp 97.5°F

## 2012-03-23 DIAGNOSIS — C9000 Multiple myeloma not having achieved remission: Secondary | ICD-10-CM

## 2012-03-23 DIAGNOSIS — Z5112 Encounter for antineoplastic immunotherapy: Secondary | ICD-10-CM

## 2012-03-23 MED ORDER — BORTEZOMIB CHEMO SQ INJECTION 3.5 MG (2.5MG/ML)
1.0000 mg/m2 | Freq: Once | INTRAMUSCULAR | Status: AC
  Administered 2012-03-23: 2.25 mg via SUBCUTANEOUS
  Filled 2012-03-23: qty 2.25

## 2012-03-23 MED ORDER — ONDANSETRON HCL 8 MG PO TABS: 8.0000 mg | ORAL_TABLET | Freq: Once | ORAL | Status: DC

## 2012-03-23 NOTE — Progress Notes (Signed)
Per Dr Cyndie Chime, ok to treat with platelets of 86.  Dixie, chemo RN aware. dph

## 2012-03-23 NOTE — Patient Instructions (Signed)
Call md for parblems

## 2012-03-30 ENCOUNTER — Other Ambulatory Visit (HOSPITAL_BASED_OUTPATIENT_CLINIC_OR_DEPARTMENT_OTHER): Payer: Medicare Other | Admitting: Lab

## 2012-03-30 ENCOUNTER — Telehealth: Payer: Self-pay | Admitting: *Deleted

## 2012-03-30 ENCOUNTER — Ambulatory Visit (HOSPITAL_BASED_OUTPATIENT_CLINIC_OR_DEPARTMENT_OTHER): Payer: Medicare Other

## 2012-03-30 ENCOUNTER — Other Ambulatory Visit: Payer: Self-pay | Admitting: *Deleted

## 2012-03-30 VITALS — BP 133/69 | HR 77 | Temp 96.7°F

## 2012-03-30 DIAGNOSIS — C9000 Multiple myeloma not having achieved remission: Secondary | ICD-10-CM

## 2012-03-30 DIAGNOSIS — C9002 Multiple myeloma in relapse: Secondary | ICD-10-CM

## 2012-03-30 DIAGNOSIS — Z5112 Encounter for antineoplastic immunotherapy: Secondary | ICD-10-CM

## 2012-03-30 LAB — CBC & DIFF AND RETIC
BASO%: 0.4 % (ref 0.0–2.0)
EOS%: 2.5 % (ref 0.0–7.0)
HCT: 29.9 % — ABNORMAL LOW (ref 38.4–49.9)
HGB: 10 g/dL — ABNORMAL LOW (ref 13.0–17.1)
LYMPH%: 11.5 % — ABNORMAL LOW (ref 14.0–49.0)
MCH: 31.4 pg (ref 27.2–33.4)
MCHC: 33.4 g/dL (ref 32.0–36.0)
NEUT#: 4.2 10*3/uL (ref 1.5–6.5)
NEUT%: 78.6 % — ABNORMAL HIGH (ref 39.0–75.0)
Platelets: 107 10*3/uL — ABNORMAL LOW (ref 140–400)
RBC: 3.18 10*6/uL — ABNORMAL LOW (ref 4.20–5.82)
WBC: 5.3 10*3/uL (ref 4.0–10.3)
nRBC: 0 % (ref 0–0)

## 2012-03-30 MED ORDER — BORTEZOMIB CHEMO SQ INJECTION 3.5 MG (2.5MG/ML)
1.0000 mg/m2 | Freq: Once | INTRAMUSCULAR | Status: AC
  Administered 2012-03-30: 2.25 mg via SUBCUTANEOUS
  Filled 2012-03-30: qty 2.25

## 2012-03-30 MED ORDER — ONDANSETRON HCL 8 MG PO TABS: 8.0000 mg | ORAL_TABLET | Freq: Once | ORAL | Status: DC

## 2012-03-30 MED ORDER — MELPHALAN 2 MG PO TABS: 5.0000 mg/m2 | ORAL_TABLET | Freq: Every day | ORAL | Status: AC

## 2012-03-30 NOTE — Telephone Encounter (Signed)
Per chemo Rn and patient I have fixed his schedule. I have called the patient's home and gave the wife the new appts for next week.  JMW

## 2012-03-30 NOTE — Telephone Encounter (Signed)
Received call from [facility] stating they received a script for alkeran today & they can't get this drug from their supplier.  This script was released by error in infusion room & pt does not need.  Verified with pt's wife & pt told to call next week if this is needed.

## 2012-03-30 NOTE — Patient Instructions (Signed)
[facility] Discharge Instructions for Patients Receiving Chemotherapy  Today you received the following chemotherapy agents Velcade.  To help prevent nausea and vomiting after your treatment, we encourage you to take your nausea medication as prescribed.   If you develop nausea and vomiting that is not controlled by your nausea medication, call the clinic. If it is after clinic hours your family physician or the after hours number for the clinic or go to the Emergency Department.   BELOW ARE SYMPTOMS THAT SHOULD BE REPORTED IMMEDIATELY:  *FEVER GREATER THAN 100.5 F  *CHILLS WITH OR WITHOUT FEVER  NAUSEA AND VOMITING THAT IS NOT CONTROLLED WITH YOUR NAUSEA MEDICATION  *UNUSUAL SHORTNESS OF BREATH  *UNUSUAL BRUISING OR BLEEDING  TENDERNESS IN MOUTH AND THROAT WITH OR WITHOUT PRESENCE OF ULCERS  *URINARY PROBLEMS  *BOWEL PROBLEMS  UNUSUAL RASH Items with * indicate a potential emergency and should be followed up as soon as possible.  One of the nurses will contact you 24 hours after your treatment. Please let the nurse know about any problems that you may have experienced. Feel free to call the clinic you have any questions or concerns. The clinic phone number is [phone].   I have been informed and understand all the instructions given to me. I know to contact the clinic, my physician, or go to the Emergency Department if any problems should occur. I do not have any questions at this time, but understand that I may call the clinic during office hours   should I have any questions or need assistance in obtaining follow up care.    __________________________________________  _____________  __________ Signature of Patient or Authorized Representative            Date                   Time    __________________________________________ Nurse's Signature    

## 2012-04-06 ENCOUNTER — Ambulatory Visit (HOSPITAL_BASED_OUTPATIENT_CLINIC_OR_DEPARTMENT_OTHER): Payer: Medicare Other

## 2012-04-06 ENCOUNTER — Other Ambulatory Visit (HOSPITAL_BASED_OUTPATIENT_CLINIC_OR_DEPARTMENT_OTHER): Payer: Medicare Other | Admitting: Lab

## 2012-04-06 VITALS — BP 118/62 | HR 89 | Temp 97.1°F

## 2012-04-06 DIAGNOSIS — C9002 Multiple myeloma in relapse: Secondary | ICD-10-CM

## 2012-04-06 DIAGNOSIS — C9000 Multiple myeloma not having achieved remission: Secondary | ICD-10-CM

## 2012-04-06 DIAGNOSIS — Z5112 Encounter for antineoplastic immunotherapy: Secondary | ICD-10-CM

## 2012-04-06 LAB — CBC WITH DIFFERENTIAL/PLATELET
Basophils Absolute: 0 10*3/uL (ref 0.0–0.1)
EOS%: 3.4 % (ref 0.0–7.0)
Eosinophils Absolute: 0.2 10*3/uL (ref 0.0–0.5)
HCT: 30.5 % — ABNORMAL LOW (ref 38.4–49.9)
HGB: 10.2 g/dL — ABNORMAL LOW (ref 13.0–17.1)
MCH: 32.9 pg (ref 27.2–33.4)
MONO#: 0.4 10*3/uL (ref 0.1–0.9)
NEUT#: 3.5 10*3/uL (ref 1.5–6.5)
NEUT%: 77.3 % — ABNORMAL HIGH (ref 39.0–75.0)
RDW: 17.2 % — ABNORMAL HIGH (ref 11.0–14.6)
lymph#: 0.4 10*3/uL — ABNORMAL LOW (ref 0.9–3.3)

## 2012-04-06 MED ORDER — BORTEZOMIB CHEMO SQ INJECTION 3.5 MG (2.5MG/ML)
1.0000 mg/m2 | Freq: Once | INTRAMUSCULAR | Status: AC
  Administered 2012-04-06: 2.25 mg via SUBCUTANEOUS
  Filled 2012-04-06: qty 2.25

## 2012-04-06 NOTE — Progress Notes (Signed)
1000-OK to treat today with platelet count of 99 per L. Maisie Fus NP.  Pt took PO Zofran at home prior to arrival at [facility]

## 2012-04-09 ENCOUNTER — Telehealth: Payer: Self-pay | Admitting: *Deleted

## 2012-04-09 NOTE — Telephone Encounter (Signed)
[employer] faxed refill request for Alkeran.  Request to MD for review.

## 2012-04-11 ENCOUNTER — Other Ambulatory Visit: Payer: Self-pay | Admitting: *Deleted

## 2012-04-11 DIAGNOSIS — C9000 Multiple myeloma not having achieved remission: Secondary | ICD-10-CM

## 2012-04-11 MED ORDER — MELPHALAN 2 MG PO TABS: ORAL_TABLET | ORAL | Status: DC

## 2012-04-11 NOTE — Telephone Encounter (Signed)
[employer] faxed confirmation of facsimile receipt.  Will verify insurance information and make delivery arrangements.

## 2012-04-13 ENCOUNTER — Ambulatory Visit: Payer: Medicare Other

## 2012-04-13 ENCOUNTER — Other Ambulatory Visit: Payer: Medicare Other | Admitting: Lab

## 2012-04-18 ENCOUNTER — Telehealth: Payer: Self-pay | Admitting: Medical Oncology

## 2012-04-18 NOTE — Telephone Encounter (Signed)
Alkeran to be delivered on 04/18/12

## 2012-04-20 ENCOUNTER — Ambulatory Visit: Payer: Medicare Other

## 2012-04-20 ENCOUNTER — Other Ambulatory Visit (HOSPITAL_BASED_OUTPATIENT_CLINIC_OR_DEPARTMENT_OTHER): Payer: Medicare Other | Admitting: Lab

## 2012-04-20 ENCOUNTER — Encounter: Payer: Self-pay | Admitting: Oncology

## 2012-04-20 ENCOUNTER — Ambulatory Visit (HOSPITAL_BASED_OUTPATIENT_CLINIC_OR_DEPARTMENT_OTHER): Payer: Medicare Other | Admitting: Oncology

## 2012-04-20 ENCOUNTER — Ambulatory Visit (HOSPITAL_BASED_OUTPATIENT_CLINIC_OR_DEPARTMENT_OTHER): Payer: Medicare Other

## 2012-04-20 VITALS — BP 146/80 | HR 79 | Temp 97.8°F | Ht 66.0 in | Wt 263.1 lb

## 2012-04-20 VITALS — BP 146/80 | HR 79 | Temp 97.8°F

## 2012-04-20 DIAGNOSIS — C9000 Multiple myeloma not having achieved remission: Secondary | ICD-10-CM

## 2012-04-20 DIAGNOSIS — E1142 Type 2 diabetes mellitus with diabetic polyneuropathy: Secondary | ICD-10-CM

## 2012-04-20 DIAGNOSIS — E1149 Type 2 diabetes mellitus with other diabetic neurological complication: Secondary | ICD-10-CM

## 2012-04-20 DIAGNOSIS — J988 Other specified respiratory disorders: Secondary | ICD-10-CM

## 2012-04-20 DIAGNOSIS — N049 Nephrotic syndrome with unspecified morphologic changes: Secondary | ICD-10-CM

## 2012-04-20 DIAGNOSIS — Z5112 Encounter for antineoplastic immunotherapy: Secondary | ICD-10-CM

## 2012-04-20 DIAGNOSIS — C9002 Multiple myeloma in relapse: Secondary | ICD-10-CM

## 2012-04-20 HISTORY — DX: Type 2 diabetes mellitus with diabetic polyneuropathy: E11.42

## 2012-04-20 LAB — CBC & DIFF AND RETIC
Basophils Absolute: 0 10*3/uL (ref 0.0–0.1)
Eosinophils Absolute: 0.1 10*3/uL (ref 0.0–0.5)
HCT: 30.7 % — ABNORMAL LOW (ref 38.4–49.9)
HGB: 10.3 g/dL — ABNORMAL LOW (ref 13.0–17.1)
Immature Retic Fract: 18.1 % — ABNORMAL HIGH (ref 3.00–10.60)
LYMPH%: 11.9 % — ABNORMAL LOW (ref 14.0–49.0)
MCV: 94.8 fL (ref 79.3–98.0)
MONO#: 0.5 10*3/uL (ref 0.1–0.9)
MONO%: 8.1 % (ref 0.0–14.0)
NEUT#: 4.4 10*3/uL (ref 1.5–6.5)
Platelets: 97 10*3/uL — ABNORMAL LOW (ref 140–400)
WBC: 5.7 10*3/uL (ref 4.0–10.3)
nRBC: 0 % (ref 0–0)

## 2012-04-20 MED ORDER — BORTEZOMIB CHEMO SQ INJECTION 3.5 MG (2.5MG/ML)
1.0000 mg/m2 | Freq: Once | INTRAMUSCULAR | Status: AC
  Administered 2012-04-20: 2.25 mg via SUBCUTANEOUS
  Filled 2012-04-20: qty 2.25

## 2012-04-20 NOTE — Progress Notes (Signed)
Hematology and Oncology Follow Up Visit  William Gaines [id number] August 26, 1941 71 y.o. 04/20/2012 7:11 PM   Principle Diagnosis: Encounter Diagnoses  Name Primary?  . Multiple myeloma without mention of remission Yes  . Nephrotic syndrome   . 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 He had long-standing proteinuria ante-dating the diagnoses of his multiple myeloma and urine studies have been difficult to interpret due to this. He received initial palliative radiation to the spine and then underwent a surgical stabilization procedure. He was started on a chemotherapy/immunotherapy program with a combination of Revlimid plus dexamethasone. He was unable to tolerate the steroids do to his advanced insulin-dependent diabetes. He did however have a nice response to the treatment. He was also put on monthly bisphosphonate infusions with Zometa. Initial treatment continued through July 2009. He progressed in February 2012 with development of  an asymptomatic lytic lesion in the left iliac bone and a rise in his urine protein level. He received additional radiation at that time to his left hip in March of 2012 and was started back on a chemotherapy program with oral melphalan plus subcutaneous Velcade. Once again he had a response to  treatment which I have continued up until this time. Although there was a suspicion of progression on CT  scans of his spine obtained when he had a flare up of chronic back pain in February of this year, there were no progressive changes on a regular skeletal bone survey and his pain level subsided back to his baseline. No additional changes were made in his treatment program. Most  recent laboratory analysis shows stable levels of kappa and lambda free serum light chains at 5.57 and 2.87 mg percent respectively with a kappa to lambda ratio of 1.94 just slightly over normal range up to 1.65. Serum IgG level remains in normal range. Due to laboratory error 24 hourr urine protein was not calculated at the time above studies were done on April 26.  He reports no new problems today. He continues to have intermittent back pain but not any worse than his baseline. No new areas of pain. No interim fever or infection. Blood sugars have been under reasonable control now that we have stopped all of the steroids. Peripheral edema has been under better control since increasing his Lasix to 40 mg twice daily.  Medications: reviewed  Allergies:  Allergies  Allergen Reactions  . Codeine     hallucinations  . Lorazepam (Lorazepam) Anxiety    Paradoxical agitation when given as pre med for bone marrow biopsy 09/11/06    Review of Systems: Constitutional:   Chronic fatigue Respiratory: Dyspnea on exertion Cardiovascular:  No chest pain or palpitations Gastrointestinal: No change in his bowel habit with intermittent constipation Genito-Urinary: No urinary tract symptoms Musculoskeletal: See above Neurologic: Intermittent headache and dizziness. Skin: No skin rash Remaining ROS negative.  Physical Exam: Blood pressure 146/80, pulse 79, temperature 97.8 F (36.6 C), temperature source Oral, height 5\' 6"  (1.676 m), weight 263 lb 1.6 oz (119.341 kg). Wt Readings from Last 3 Encounters:  04/20/12 263 lb 1.6 oz (119.341 kg)  03/13/12 264 lb 9.6 oz (120.022 kg)  02/17/12 260 lb 11.2 oz (118.253 kg)  General appearance: Overweight Caucasian man HENNT: Pharynx no erythema exudate or ulcer Lymph nodes: No adenopathy Breasts: Lungs: Clear to auscultation resonant to percussion Heart: Regular rhythm no murmur Abdomen: Soft obese nontender ventral hernia/umbilicus  hernia Extremities: 1+ brawny edema, venous stasis changes-chronic Vascular: No cyanosis Neurologic: Motor strength 5 over 5 all extremities, reflexes absent symmetric at the knees 1+ symmetric at the biceps, moderate to severe decrease in vibration sensation by tuning fork exam over the fingertips Skin: No rash or ecchymosis  Lab Results: Lab Results  Component Value Date   WBC 5.7 04/20/2012   HGB 10.3* 04/20/2012   HCT 30.7* 04/20/2012   MCV 94.8 04/20/2012   PLT 97* 04/20/2012     Chemistry      Component Value Date/Time   NA 137 03/02/2012 1223   NA 137 07/07/2011 1217   K 3.8 03/02/2012 1223   K 4.8* 07/07/2011 1217   CL 106 03/02/2012 1223   CL 99 07/07/2011 1217   CO2 24 03/02/2012 1223   CO2 29 07/07/2011 1217   BUN 42* 03/02/2012 1223   BUN 41* 07/07/2011 1217   CREATININE 2.34* 03/02/2012 1223   CREATININE 2.1* 07/07/2011 1217   CREATININE 1.90* 03/25/2011 1008   CREATININE 1.90* 03/25/2011 1008   CREATININE 1.90* 03/25/2011 1008   CREATININE 1.90* 03/25/2011 1008      Component Value Date/Time   CALCIUM 8.5 03/02/2012 1223   CALCIUM 8.7 07/07/2011 1217   ALKPHOS 59 03/02/2012 1223   AST 20 03/02/2012 1223   ALT 28 03/02/2012 1223   BILITOT 0.3 03/02/2012 1223      Impression and Plan: #1. Nonsecretory versus kappa light chain myeloma Stable plateau phase on current treatment outlined above. Plan: Continue the oral Alkeran and subcutaneous Velcade through the end of August and then put him on low-dose Revlimid maintenance therapy. Data presented at both the American Society of hematology meeting in December and at the American Society of clinical oncology meeting in May suggest that remission is prolonged by maintenance therapy. Although this is primarily documented in the transplant setting, I don't doubt that this also applies in people who did not receive high-dose IV melphalan  #2. Chronic idiopathic nephrotic syndrome  #3. Insulin-dependent diabetes.  #4. 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  #5. Obstructive airway disease   CC:.    Levert Feinstein, MD 6/14/20137:11 PM

## 2012-04-20 NOTE — Progress Notes (Signed)
1544 Ok to treat with platelets at 97 per Dr. Cyndie Chime. 1547 Patient took nausea medication at home.

## 2012-04-20 NOTE — Patient Instructions (Signed)

## 2012-04-25 ENCOUNTER — Telehealth: Payer: Self-pay | Admitting: *Deleted

## 2012-04-25 NOTE — Telephone Encounter (Signed)
Per staff message and POF I have scheduled appts.  JMW  

## 2012-04-27 ENCOUNTER — Telehealth: Payer: Self-pay | Admitting: Oncology

## 2012-04-27 ENCOUNTER — Other Ambulatory Visit (HOSPITAL_BASED_OUTPATIENT_CLINIC_OR_DEPARTMENT_OTHER): Payer: Medicare Other

## 2012-04-27 ENCOUNTER — Ambulatory Visit (HOSPITAL_BASED_OUTPATIENT_CLINIC_OR_DEPARTMENT_OTHER): Payer: Medicare Other

## 2012-04-27 VITALS — BP 133/65 | HR 68 | Temp 98.4°F

## 2012-04-27 DIAGNOSIS — Z5112 Encounter for antineoplastic immunotherapy: Secondary | ICD-10-CM

## 2012-04-27 DIAGNOSIS — C9000 Multiple myeloma not having achieved remission: Secondary | ICD-10-CM

## 2012-04-27 DIAGNOSIS — N08 Glomerular disorders in diseases classified elsewhere: Secondary | ICD-10-CM

## 2012-04-27 DIAGNOSIS — C9002 Multiple myeloma in relapse: Secondary | ICD-10-CM

## 2012-04-27 LAB — CBC & DIFF AND RETIC
Basophils Absolute: 0 10*3/uL (ref 0.0–0.1)
EOS%: 2.8 % (ref 0.0–7.0)
HGB: 10.3 g/dL — ABNORMAL LOW (ref 13.0–17.1)
Immature Retic Fract: 18.2 % — ABNORMAL HIGH (ref 3.00–10.60)
MCH: 32.1 pg (ref 27.2–33.4)
NEUT#: 3.8 10*3/uL (ref 1.5–6.5)
RDW: 15.9 % — ABNORMAL HIGH (ref 11.0–14.6)
Retic Ct Abs: 64.84 10*3/uL (ref 34.80–93.90)
lymph#: 0.4 10*3/uL — ABNORMAL LOW (ref 0.9–3.3)

## 2012-04-27 MED ORDER — ONDANSETRON HCL 8 MG PO TABS: 8.0000 mg | ORAL_TABLET | Freq: Once | ORAL | Status: DC

## 2012-04-27 MED ORDER — BORTEZOMIB CHEMO SQ INJECTION 3.5 MG (2.5MG/ML)
1.0000 mg/m2 | Freq: Once | INTRAMUSCULAR | Status: AC
  Administered 2012-04-27: 2.25 mg via SUBCUTANEOUS
  Filled 2012-04-27: qty 2.25

## 2012-04-27 NOTE — Telephone Encounter (Signed)
s/w pt and he is aware that his sch is ready and will p/u a copy on 6/28   aom

## 2012-04-30 ENCOUNTER — Telehealth: Payer: Self-pay | Admitting: Oncology

## 2012-04-30 NOTE — Telephone Encounter (Signed)
Talked to patient , gave him appt date for June ,July and August 2013 advised pt to get calendar

## 2012-05-04 ENCOUNTER — Other Ambulatory Visit (HOSPITAL_BASED_OUTPATIENT_CLINIC_OR_DEPARTMENT_OTHER): Payer: Medicare Other | Admitting: Lab

## 2012-05-04 ENCOUNTER — Ambulatory Visit (HOSPITAL_BASED_OUTPATIENT_CLINIC_OR_DEPARTMENT_OTHER): Payer: Medicare Other

## 2012-05-04 VITALS — BP 128/62 | HR 76 | Temp 97.6°F

## 2012-05-04 DIAGNOSIS — C9002 Multiple myeloma in relapse: Secondary | ICD-10-CM

## 2012-05-04 DIAGNOSIS — Z5112 Encounter for antineoplastic immunotherapy: Secondary | ICD-10-CM

## 2012-05-04 DIAGNOSIS — C9000 Multiple myeloma not having achieved remission: Secondary | ICD-10-CM

## 2012-05-04 LAB — CBC & DIFF AND RETIC
BASO%: 0.2 % (ref 0.0–2.0)
EOS%: 3.4 % (ref 0.0–7.0)
HGB: 10 g/dL — ABNORMAL LOW (ref 13.0–17.1)
MCH: 31.8 pg (ref 27.2–33.4)
MCHC: 33.9 g/dL (ref 32.0–36.0)
RBC: 3.14 10*6/uL — ABNORMAL LOW (ref 4.20–5.82)
RDW: 16.2 % — ABNORMAL HIGH (ref 11.0–14.6)
Retic %: 2.45 % — ABNORMAL HIGH (ref 0.80–1.80)
Retic Ct Abs: 76.93 10*3/uL (ref 34.80–93.90)
lymph#: 0.5 10*3/uL — ABNORMAL LOW (ref 0.9–3.3)
nRBC: 0 % (ref 0–0)

## 2012-05-04 MED ORDER — ONDANSETRON HCL 8 MG PO TABS: 8.0000 mg | ORAL_TABLET | Freq: Once | ORAL | Status: DC

## 2012-05-04 MED ORDER — BORTEZOMIB CHEMO SQ INJECTION 3.5 MG (2.5MG/ML)
1.0000 mg/m2 | Freq: Once | INTRAMUSCULAR | Status: AC
  Administered 2012-05-04: 2.25 mg via SUBCUTANEOUS
  Filled 2012-05-04: qty 2.25

## 2012-05-04 NOTE — Progress Notes (Signed)
Per Lonna Cobb, NP, pt can be treated with plts 88.  Pt already took 8mg  zofran at home.  SLJ

## 2012-05-04 NOTE — Patient Instructions (Signed)
[facility] Discharge Instructions for Patients Receiving Chemotherapy  Today you received the following chemotherapy agents velcade  To help prevent nausea and vomiting after your treatment, we encourage you to take your nausea medication  If you develop nausea and vomiting that is not controlled by your nausea medication, call the clinic. If it is after clinic hours your family physician or the after hours number for the clinic or go to the Emergency Department.   BELOW ARE SYMPTOMS THAT SHOULD BE REPORTED IMMEDIATELY:  *FEVER GREATER THAN 100.5 F  *CHILLS WITH OR WITHOUT FEVER  NAUSEA AND VOMITING THAT IS NOT CONTROLLED WITH YOUR NAUSEA MEDICATION  *UNUSUAL SHORTNESS OF BREATH  *UNUSUAL BRUISING OR BLEEDING  TENDERNESS IN MOUTH AND THROAT WITH OR WITHOUT PRESENCE OF ULCERS  *URINARY PROBLEMS  *BOWEL PROBLEMS  UNUSUAL RASH Items with * indicate a potential emergency and should be followed up as soon as possible.  . The clinic phone number is [phone].   I have been informed and understand all the instructions given to me. I know to contact the clinic, my physician, or go to the Emergency Department if any problems should occur. I do not have any questions at this time, but understand that I may call the clinic during office hours   should I have any questions or need assistance in obtaining follow up care.    __________________________________________  _____________  __________ Signature of Patient or Authorized Representative            Date                   Time    __________________________________________ Nurse's Signature

## 2012-05-16 ENCOUNTER — Other Ambulatory Visit: Payer: Self-pay | Admitting: Oncology

## 2012-05-17 ENCOUNTER — Encounter: Payer: Self-pay | Admitting: *Deleted

## 2012-05-17 NOTE — Progress Notes (Signed)
RECEIVED A FAX FROM [employer] CONCERNING A CONFIRMATION OF PRESCRIPTION SHIPMENT FOR ALKERAN.

## 2012-05-18 ENCOUNTER — Ambulatory Visit (HOSPITAL_BASED_OUTPATIENT_CLINIC_OR_DEPARTMENT_OTHER): Payer: Medicare Other

## 2012-05-18 ENCOUNTER — Other Ambulatory Visit (HOSPITAL_BASED_OUTPATIENT_CLINIC_OR_DEPARTMENT_OTHER): Payer: Medicare Other | Admitting: Lab

## 2012-05-18 VITALS — BP 136/72 | HR 83 | Temp 97.5°F

## 2012-05-18 DIAGNOSIS — C9000 Multiple myeloma not having achieved remission: Secondary | ICD-10-CM

## 2012-05-18 DIAGNOSIS — Z5112 Encounter for antineoplastic immunotherapy: Secondary | ICD-10-CM

## 2012-05-18 DIAGNOSIS — C9002 Multiple myeloma in relapse: Secondary | ICD-10-CM

## 2012-05-18 DIAGNOSIS — N049 Nephrotic syndrome with unspecified morphologic changes: Secondary | ICD-10-CM

## 2012-05-18 DIAGNOSIS — N08 Glomerular disorders in diseases classified elsewhere: Secondary | ICD-10-CM

## 2012-05-18 LAB — CBC & DIFF AND RETIC
BASO%: 0.2 % (ref 0.0–2.0)
Basophils Absolute: 0 10*3/uL (ref 0.0–0.1)
EOS%: 1.5 % (ref 0.0–7.0)
HGB: 10.6 g/dL — ABNORMAL LOW (ref 13.0–17.1)
Immature Retic Fract: 17.8 % — ABNORMAL HIGH (ref 3.00–10.60)
MCH: 32.1 pg (ref 27.2–33.4)
MCHC: 33.9 g/dL (ref 32.0–36.0)
RDW: 16.1 % — ABNORMAL HIGH (ref 11.0–14.6)
Retic Ct Abs: 73.59 10*3/uL (ref 34.80–93.90)
lymph#: 0.5 10*3/uL — ABNORMAL LOW (ref 0.9–3.3)

## 2012-05-18 MED ORDER — BORTEZOMIB CHEMO SQ INJECTION 3.5 MG (2.5MG/ML)
1.0000 mg/m2 | Freq: Once | INTRAMUSCULAR | Status: AC
  Administered 2012-05-18: 2.25 mg via SUBCUTANEOUS
  Filled 2012-05-18: qty 2.25

## 2012-05-18 NOTE — Patient Instructions (Signed)
[facility] Discharge Instructions for Patients Receiving Chemotherapy  Today you received the following chemotherapy agents Velcade.  To help prevent nausea and vomiting after your treatment, we encourage you to take your nausea medication as prescribed.   If you develop nausea and vomiting that is not controlled by your nausea medication, call the clinic. If it is after clinic hours your family physician or the after hours number for the clinic or go to the Emergency Department.   BELOW ARE SYMPTOMS THAT SHOULD BE REPORTED IMMEDIATELY:  *FEVER GREATER THAN 100.5 F  *CHILLS WITH OR WITHOUT FEVER  NAUSEA AND VOMITING THAT IS NOT CONTROLLED WITH YOUR NAUSEA MEDICATION  *UNUSUAL SHORTNESS OF BREATH  *UNUSUAL BRUISING OR BLEEDING  TENDERNESS IN MOUTH AND THROAT WITH OR WITHOUT PRESENCE OF ULCERS  *URINARY PROBLEMS  *BOWEL PROBLEMS  UNUSUAL RASH Items with * indicate a potential emergency and should be followed up as soon as possible.  One of the nurses will contact you 24 hours after your treatment. Please let the nurse know about any problems that you may have experienced. Feel free to call the clinic you have any questions or concerns. The clinic phone number is [phone].   I have been informed and understand all the instructions given to me. I know to contact the clinic, my physician, or go to the Emergency Department if any problems should occur. I do not have any questions at this time, but understand that I may call the clinic during office hours   should I have any questions or need assistance in obtaining follow up care.    __________________________________________  _____________  __________ Signature of Patient or Authorized Representative            Date                   Time    __________________________________________ Nurse's Signature    

## 2012-05-18 NOTE — Progress Notes (Signed)
1007- Okay per Lonna Cobb, NP to treat today with platelet count-dhp, rn  Pt reports he took Zofran 8mg  po at home before coming in this morning-dhp, rn

## 2012-05-21 LAB — COMPREHENSIVE METABOLIC PANEL
ALT: 29 U/L (ref 0–53)
CO2: 24 mEq/L (ref 19–32)
Calcium: 8.3 mg/dL — ABNORMAL LOW (ref 8.4–10.5)
Chloride: 107 mEq/L (ref 96–112)
Sodium: 143 mEq/L (ref 135–145)
Total Bilirubin: 0.3 mg/dL (ref 0.3–1.2)
Total Protein: 6.7 g/dL (ref 6.0–8.3)

## 2012-05-25 ENCOUNTER — Ambulatory Visit (HOSPITAL_BASED_OUTPATIENT_CLINIC_OR_DEPARTMENT_OTHER): Payer: Medicare Other

## 2012-05-25 ENCOUNTER — Other Ambulatory Visit (HOSPITAL_BASED_OUTPATIENT_CLINIC_OR_DEPARTMENT_OTHER): Payer: Medicare Other | Admitting: Lab

## 2012-05-25 VITALS — BP 130/72 | HR 86 | Temp 97.9°F

## 2012-05-25 DIAGNOSIS — C9002 Multiple myeloma in relapse: Secondary | ICD-10-CM

## 2012-05-25 DIAGNOSIS — Z5112 Encounter for antineoplastic immunotherapy: Secondary | ICD-10-CM

## 2012-05-25 DIAGNOSIS — C9000 Multiple myeloma not having achieved remission: Secondary | ICD-10-CM

## 2012-05-25 LAB — CBC & DIFF AND RETIC
BASO%: 0.2 % (ref 0.0–2.0)
EOS%: 2.2 % (ref 0.0–7.0)
Immature Retic Fract: 17.1 % — ABNORMAL HIGH (ref 3.00–10.60)
LYMPH%: 8.9 % — ABNORMAL LOW (ref 14.0–49.0)
MCH: 32.6 pg (ref 27.2–33.4)
MCHC: 34.8 g/dL (ref 32.0–36.0)
MONO#: 0.4 10*3/uL (ref 0.1–0.9)
Platelets: 99 10*3/uL — ABNORMAL LOW (ref 140–400)
RBC: 3.22 10*6/uL — ABNORMAL LOW (ref 4.20–5.82)
WBC: 5.4 10*3/uL (ref 4.0–10.3)
nRBC: 0 % (ref 0–0)

## 2012-05-25 MED ORDER — BORTEZOMIB CHEMO SQ INJECTION 3.5 MG (2.5MG/ML)
1.0000 mg/m2 | Freq: Once | INTRAMUSCULAR | Status: AC
  Administered 2012-05-25: 2.25 mg via SUBCUTANEOUS
  Filled 2012-05-25: qty 2.25

## 2012-05-25 NOTE — Progress Notes (Signed)
1120 Ok to treat with platelets @ 99 per Dr. Cyndie Chime. Zofran not administered pre velcade injection today since patient took at home prior to appointment.

## 2012-05-25 NOTE — Patient Instructions (Signed)
[facility] Discharge Instructions for Patients Receiving Chemotherapy  Today you received the following chemotherapy agent Velcade  To help prevent nausea and vomiting after your treatment, we encourage you to take your nausea medication. Begin taking it as often as prescribed for by Dr. Cyndie Chime.    If you develop nausea and vomiting that is not controlled by your nausea medication, call the clinic. If it is after clinic hours your family physician or the after hours number for the clinic or go to the Emergency Department.   BELOW ARE SYMPTOMS THAT SHOULD BE REPORTED IMMEDIATELY:  *FEVER GREATER THAN 100.5 F  *CHILLS WITH OR WITHOUT FEVER  NAUSEA AND VOMITING THAT IS NOT CONTROLLED WITH YOUR NAUSEA MEDICATION  *UNUSUAL SHORTNESS OF BREATH  *UNUSUAL BRUISING OR BLEEDING  TENDERNESS IN MOUTH AND THROAT WITH OR WITHOUT PRESENCE OF ULCERS  *URINARY PROBLEMS  *BOWEL PROBLEMS  UNUSUAL RASH Items with * indicate a potential emergency and should be followed up as soon as possible.  One of the nurses will contact you 24 hours after your treatment. Please let the nurse know about any problems that you may have experienced. Feel free to call the clinic you have any questions or concerns. The clinic phone number is [phone].   I have been informed and understand all the instructions given to me. I know to contact the clinic, my physician, or go to the Emergency Department if any problems should occur. I do not have any questions at this time, but understand that I may call the clinic during office hours   should I have any questions or need assistance in obtaining follow up care.    __________________________________________  _____________  __________ Signature of Patient or Authorized Representative            Date                   Time    __________________________________________ Nurse's Signature     BORTEZOMIB (bor TEZ oh mib) is a chemotherapy  drug. It slows the growth of cancer cells. This medicine is used to treat multiple myeloma, lymphoma, and other cancers. This medicine may be used for other purposes; ask your health care provider or pharmacist if you have questions. What should I tell my health care provider before I take this medicine? They need to know if you have any of these conditions: -heart disease -irregular heartbeat -liver disease -low blood counts, like low white blood cells, platelets, or hemoglobin -peripheral neuropathy -taking medicine for blood pressure -an unusual or allergic reaction to bortezomib, mannitol, boron, other medicines, foods, dyes, or preservatives -pregnant or trying to get pregnant -breast-feeding How should I use this medicine? This medicine is for injection into a vein or for injection under the skin. It is given by a health care professional in a hospital or clinic setting. Talk to your pediatrician regarding the use of this medicine in children. Special care may be needed. Overdosage: If you think you have taken too much of this medicine contact a poison control center or emergency room at once. NOTE: This medicine is only for you. Do not share this medicine with others. What if I miss a dose? It is important not to miss your dose. Call your doctor or health care professional if you are unable to keep an appointment. What may interact with this medicine? -medicines for diabetes -medicines to increase blood counts like filgrastim, pegfilgrastim, sargramostim -zalcitabine Talk to your doctor or health care  professional before taking any of these medicines: -acetaminophen -aspirin -ibuprofen -ketoprofen -naproxen This list may not describe all possible interactions. Give your health care provider a list of all the medicines, herbs, non-prescription drugs, or dietary supplements you use. Also tell them if you smoke, drink alcohol, or use illegal drugs. Some items may interact with your  medicine. What should I watch for while using this medicine? Visit your doctor for checks on your progress. This drug may make you feel generally unwell. This is not uncommon, as chemotherapy can affect healthy cells as well as cancer cells. Report any side effects. Continue your course of treatment even though you feel ill unless your doctor tells you to stop. You may get drowsy or dizzy. Do not drive, use machinery, or do anything that needs mental alertness until you know how this medicine affects you. Do not stand or sit up quickly, especially if you are an older patient. This reduces the risk of dizzy or fainting spells. In some cases, you may be given additional medicines to help with side effects. Follow all directions for their use. Call your doctor or health care professional for advice if you get a fever, chills or sore throat, or other symptoms of a cold or flu. Do not treat yourself. This drug decreases your body's ability to fight infections. Try to avoid being around people who are sick. This medicine may increase your risk to bruise or bleed. Call your doctor or health care professional if you notice any unusual bleeding. Be careful brushing and flossing your teeth or using a toothpick because you may get an infection or bleed more easily. If you have any dental work done, tell your dentist you are receiving this medicine. Avoid taking products that contain aspirin, acetaminophen, ibuprofen, naproxen, or ketoprofen unless instructed by your doctor. These medicines may hide a fever. Do not become pregnant while taking this medicine. Women should inform their doctor if they wish to become pregnant or think they might be pregnant. There is a potential for serious side effects to an unborn child. Talk to your health care professional or pharmacist for more information. Do not breast-feed an infant while taking this medicine. You may have vomiting or diarrhea while taking this medicine. Drink  water or other fluids as directed. What side effects may I notice from receiving this medicine? Side effects that you should report to your doctor or health care professional as soon as possible: -allergic reactions like skin rash, itching or hives, swelling of the face, lips, or tongue -breathing problems -changes in hearing -changes in vision -fast, irregular heartbeat -feeling faint or lightheaded, falls -pain, tingling, numbness in the hands or feet -seizures -swelling of the ankles, feet, hands -unusual bleeding or bruising -unusually weak or tired -vomiting Side effects that usually do not require medical attention (report to your doctor or health care professional if they continue or are bothersome): -changes in emotions or moods -constipation -diarrhea -loss of appetite -headache -irritation at site where injected -nausea This list may not describe all possible side effects. Call your doctor for medical advice about side effects. You may report side effects to FDA at 1-800-FDA-1088. Where should I keep my medicine? This drug is given in a hospital or clinic and will not be stored at home. NOTE: This sheet is a summary. It may not cover all possible information. If you have questions about this medicine, talk to your doctor, pharmacist, or health care provider.  2012, [employer]. (12/01/2010 11:42:36 AM)

## 2012-05-29 LAB — UIFE/LIGHT CHAINS/TP QN, 24-HR UR
Albumin, U: DETECTED
Alpha 1, Urine: DETECTED — AB
Alpha 2, Urine: DETECTED — AB
Beta, Urine: DETECTED — AB
Total Protein, Urine-Ur/day: 983 mg/d — ABNORMAL HIGH (ref 10–140)

## 2012-06-01 ENCOUNTER — Other Ambulatory Visit (HOSPITAL_BASED_OUTPATIENT_CLINIC_OR_DEPARTMENT_OTHER): Payer: Medicare Other | Admitting: Lab

## 2012-06-01 ENCOUNTER — Ambulatory Visit (HOSPITAL_BASED_OUTPATIENT_CLINIC_OR_DEPARTMENT_OTHER): Payer: Medicare Other

## 2012-06-01 VITALS — BP 109/64 | HR 93 | Temp 97.3°F

## 2012-06-01 DIAGNOSIS — N08 Glomerular disorders in diseases classified elsewhere: Secondary | ICD-10-CM

## 2012-06-01 DIAGNOSIS — C9000 Multiple myeloma not having achieved remission: Secondary | ICD-10-CM

## 2012-06-01 DIAGNOSIS — C9002 Multiple myeloma in relapse: Secondary | ICD-10-CM

## 2012-06-01 DIAGNOSIS — Z5112 Encounter for antineoplastic immunotherapy: Secondary | ICD-10-CM

## 2012-06-01 LAB — CBC & DIFF AND RETIC
Eosinophils Absolute: 0.1 10*3/uL (ref 0.0–0.5)
Immature Retic Fract: 20.4 % — ABNORMAL HIGH (ref 3.00–10.60)
MONO#: 0.5 10*3/uL (ref 0.1–0.9)
NEUT#: 3.5 10*3/uL (ref 1.5–6.5)
RBC: 3 10*6/uL — ABNORMAL LOW (ref 4.20–5.82)
RDW: 16.1 % — ABNORMAL HIGH (ref 11.0–14.6)
Retic %: 2.44 % — ABNORMAL HIGH (ref 0.80–1.80)
WBC: 4.7 10*3/uL (ref 4.0–10.3)
nRBC: 0 % (ref 0–0)

## 2012-06-01 MED ORDER — BORTEZOMIB CHEMO SQ INJECTION 3.5 MG (2.5MG/ML)
1.0000 mg/m2 | Freq: Once | INTRAMUSCULAR | Status: AC
  Administered 2012-06-01: 2.25 mg via SUBCUTANEOUS
  Filled 2012-06-01: qty 2.25

## 2012-06-07 ENCOUNTER — Other Ambulatory Visit: Payer: Self-pay | Admitting: Oncology

## 2012-06-15 ENCOUNTER — Ambulatory Visit (HOSPITAL_BASED_OUTPATIENT_CLINIC_OR_DEPARTMENT_OTHER): Payer: Medicare Other

## 2012-06-15 ENCOUNTER — Ambulatory Visit (HOSPITAL_BASED_OUTPATIENT_CLINIC_OR_DEPARTMENT_OTHER): Payer: Medicare Other | Admitting: Oncology

## 2012-06-15 ENCOUNTER — Telehealth: Payer: Self-pay | Admitting: *Deleted

## 2012-06-15 ENCOUNTER — Encounter: Payer: Self-pay | Admitting: *Deleted

## 2012-06-15 ENCOUNTER — Other Ambulatory Visit: Payer: Self-pay | Admitting: Oncology

## 2012-06-15 ENCOUNTER — Other Ambulatory Visit: Payer: Self-pay | Admitting: *Deleted

## 2012-06-15 ENCOUNTER — Ambulatory Visit (HOSPITAL_BASED_OUTPATIENT_CLINIC_OR_DEPARTMENT_OTHER): Payer: Medicare Other | Admitting: Lab

## 2012-06-15 VITALS — BP 140/66 | HR 92 | Temp 96.8°F | Resp 22 | Ht 66.0 in | Wt 262.9 lb

## 2012-06-15 DIAGNOSIS — C9000 Multiple myeloma not having achieved remission: Secondary | ICD-10-CM

## 2012-06-15 DIAGNOSIS — E1321 Other specified diabetes mellitus with diabetic nephropathy: Secondary | ICD-10-CM

## 2012-06-15 DIAGNOSIS — Z5112 Encounter for antineoplastic immunotherapy: Secondary | ICD-10-CM

## 2012-06-15 DIAGNOSIS — N049 Nephrotic syndrome with unspecified morphologic changes: Secondary | ICD-10-CM

## 2012-06-15 DIAGNOSIS — E1143 Type 2 diabetes mellitus with diabetic autonomic (poly)neuropathy: Secondary | ICD-10-CM

## 2012-06-15 DIAGNOSIS — C9002 Multiple myeloma in relapse: Secondary | ICD-10-CM

## 2012-06-15 DIAGNOSIS — E1142 Type 2 diabetes mellitus with diabetic polyneuropathy: Secondary | ICD-10-CM

## 2012-06-15 DIAGNOSIS — E1149 Type 2 diabetes mellitus with other diabetic neurological complication: Secondary | ICD-10-CM

## 2012-06-15 LAB — CBC WITH DIFFERENTIAL/PLATELET
BASO%: 0.2 % (ref 0.0–2.0)
Basophils Absolute: 0 10e3/uL (ref 0.0–0.1)
EOS%: 2 % (ref 0.0–7.0)
Eosinophils Absolute: 0.1 10e3/uL (ref 0.0–0.5)
HCT: 30.5 % — ABNORMAL LOW (ref 38.4–49.9)
HGB: 10.3 g/dL — ABNORMAL LOW (ref 13.0–17.1)
LYMPH%: 13.3 % — ABNORMAL LOW (ref 14.0–49.0)
MCH: 32.3 pg (ref 27.2–33.4)
MCHC: 33.8 g/dL (ref 32.0–36.0)
MCV: 95.6 fL (ref 79.3–98.0)
MONO#: 0.4 10e3/uL (ref 0.1–0.9)
MONO%: 8.5 % (ref 0.0–14.0)
NEUT#: 3.5 10e3/uL (ref 1.5–6.5)
NEUT%: 76 % — ABNORMAL HIGH (ref 39.0–75.0)
Platelets: 104 10e3/uL — ABNORMAL LOW (ref 140–400)
RBC: 3.19 10e6/uL — ABNORMAL LOW (ref 4.20–5.82)
RDW: 16 % — ABNORMAL HIGH (ref 11.0–14.6)
WBC: 4.6 10e3/uL (ref 4.0–10.3)
lymph#: 0.6 10e3/uL — ABNORMAL LOW (ref 0.9–3.3)
nRBC: 0 % (ref 0–0)

## 2012-06-15 MED ORDER — BORTEZOMIB CHEMO SQ INJECTION 3.5 MG (2.5MG/ML)
1.0000 mg/m2 | Freq: Once | INTRAMUSCULAR | Status: AC
  Administered 2012-06-15: 2.25 mg via SUBCUTANEOUS
  Filled 2012-06-15: qty 2.25

## 2012-06-15 MED ORDER — ONDANSETRON HCL 8 MG PO TABS: 8.0000 mg | ORAL_TABLET | Freq: Once | ORAL | Status: DC

## 2012-06-15 NOTE — Progress Notes (Signed)
Hematology and Oncology Follow Up Visit  William Gaines [id number] 18-Dec-1940 71 y.o. 06/15/2012 12:05 PM   Principle Diagnosis: Encounter Diagnoses  Name Primary?  . Multiple myeloma in relapse Yes  . 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 He had long-standing proteinuria ante-dating the diagnoses of his multiple myeloma and urine studies have been difficult to interpret due to this. He received initial palliative radiation to the spine and then underwent a surgical stabilization procedure. He was started on a chemotherapy/immunotherapy program with a combination of Revlimid plus dexamethasone. He was unable to tolerate the steroids do to his advanced insulin-dependent diabetes. He did however have a nice response to the treatment. He was also put on monthly bisphosphonate infusions with Zometa. Initial treatment continued through July 2009. He progressed in February 2012 with development of an asymptomatic lytic lesion in the left iliac bone and a rise in his urine protein level. He received additional radiation at that time to his left hip in March of 2012 and was started back on a chemotherapy program with oral melphalan plus subcutaneous Velcade. Once again he had a response to treatment which I have continued up until this time. Although there was a suspicion of progression on CT scans of his spine obtained when he had a flare up of chronic back pain in February of this year, there were no progressive changes on a regular skeletal bone survey and his pain level subsided back to his baseline. No additional changes were made in his  treatment program.  Most recent laboratory analysis shows stable levels of kappa and lambda free serum light chains at 5.57 and 2.87 mg percent respectively with a kappa to lambda ratio of 1.94 just slightly over normal range up to 1.65. Serum IgG level remains in normal range. Most recent 24-hour urine done 05/25/2012 shows significant reduction in total protein now only 983 mg. This compares with 5822 mg last year on 03/25/2011. Serum kappa free light chains remain mildly elevated but no trend for further increase with most recent value 7.19 mg percent on July 12.  He has had no interim medical problems. He got an abrasion on his right leg where he scratched himself with his shoe. This appears like there may be an early infection but he tells me that it has improved significantly since the minor trauma. He is once again advised that he needs to be very careful in view of his diabetes and immunosuppressive blood disorder and chemotherapy.   Medications: reviewed  Allergies:  Allergies  Allergen Reactions  . Codeine     hallucinations  . Lorazepam (Lorazepam) Anxiety    Paradoxical agitation when given as pre med for bone marrow biopsy 09/11/06    Review of Systems: Constitutional:  No constitutional symptoms  Respiratory: No cough or dyspnea Cardiovascular:  No chest pain or palpitations Gastrointestinal: No change in bowel habit Genito-Urinary: Not questioned Musculoskeletal: No new bone pain Neurologic: Mild neuropathy of his hands moderate of his feet Skin: Abrasion skin right leg Remaining ROS negative.  Physical Exam: Blood pressure 140/66, pulse 92, temperature 96.8 F (36 C), temperature source Oral,  resp. rate 22, height 5\' 6"  (1.676 m), weight 262 lb 14.4 oz (119.251 kg). Wt Readings from Last 3 Encounters:  06/15/12 262 lb 14.4 oz (119.251 kg)  04/20/12 263 lb 1.6 oz (119.341 kg)  03/13/12 264 lb 9.6 oz (120.022 kg)     General appearance: Obese Caucasian man HENNT:  Pharynx no erythema exudate or ulcer Lymph nodes: No adenopathy Breasts: Lungs: Clear to auscultation resonant to percussion Heart: Regular rhythm no murmur Abdomen: Obese, large ventral hernia Extremities: Chronic brawny edema right greater than left but significantly improved compared with past exams. Venous stasis changes. Abrasion skin lateral tibial area right leg with some serous exudate mild surrounding erythema Vascular: No cyanosis Neurologic: Motor strength 5 over 5. Moderate decrease in vibration sense over the fingertips by tuning fork exam Skin: No rash or ecchymosis  Lab Results: Lab Results  Component Value Date   WBC 4.6 06/15/2012   HGB 10.3* 06/15/2012   HCT 30.5* 06/15/2012   MCV 95.6 06/15/2012   PLT 104* 06/15/2012     Chemistry      Component Value Date/Time   NA 143 05/18/2012 0954   NA 137 07/07/2011 1217   K 3.9 05/18/2012 0954   K 4.8* 07/07/2011 1217   CL 107 05/18/2012 0954   CL 99 07/07/2011 1217   CO2 24 05/18/2012 0954   CO2 29 07/07/2011 1217   BUN 40* 05/18/2012 0954   BUN 41* 07/07/2011 1217   CREATININE 2.24* 05/18/2012 0954   CREATININE 2.1* 07/07/2011 1217   CREATININE 1.90* 03/25/2011 1008   CREATININE 1.90* 03/25/2011 1008   CREATININE 1.90* 03/25/2011 1008   CREATININE 1.90* 03/25/2011 1008      Component Value Date/Time   CALCIUM 8.3* 05/18/2012 0954   CALCIUM 8.7 07/07/2011 1217   ALKPHOS 48 05/18/2012 0954   AST 19 05/18/2012 0954   ALT 29 05/18/2012 0954   BILITOT 0.3 05/18/2012 0954       Radiological Studies: No results found.  Impression and Plan:  #1. Nonsecretory versus kappa light chain myeloma  Stable plateau phase on current treatment outlined above.  Plan: Continue the oral Alkeran and subcutaneous Velcade through the end of August and then put him on low-dose Revlimid maintenance therapy.   #2. 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 Significant  response to anti-myeloma therapy. #3. Insulin-dependent diabetes.  #4. Chronic renal insufficiency secondary to #12 and 3. Creatinine is stable to decreased compare with prior values. #5. 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  #6. Obstructive airway disease #7 and. Abrasion right leg. Improving according to the patient. No obvious infection at present. Patient advised to continue local care   CC:. Dr. Lyn Hollingshead; Dr. Danella Sensing, MD 8/9/201312:05 PM

## 2012-06-15 NOTE — Progress Notes (Signed)
Fax confirmation from [facility] that Alkeran was shipped on 06/14/12. Forwarded to Set [employer].

## 2012-06-15 NOTE — Telephone Encounter (Signed)
Gave patient appointment for 07-16-2012 patient requested the afternoon appointment and 07-15-2012 is on a Sunday that is why the patient is on 07-16-2012

## 2012-06-15 NOTE — Patient Instructions (Signed)
[facility] Discharge Instructions for Patients Receiving Chemotherapy  Today you received the following chemotherapy agents: Velcade   To help prevent nausea and vomiting after your treatment, we encourage you to take your nausea medication.  Take it as often as prescribed.     If you develop nausea and vomiting that is not controlled by your nausea medication, call the clinic. If it is after clinic hours your family physician or the after hours number for the clinic or go to the Emergency Department.   BELOW ARE SYMPTOMS THAT SHOULD BE REPORTED IMMEDIATELY:  *FEVER GREATER THAN 100.5 F  *CHILLS WITH OR WITHOUT FEVER  NAUSEA AND VOMITING THAT IS NOT CONTROLLED WITH YOUR NAUSEA MEDICATION  *UNUSUAL SHORTNESS OF BREATH  *UNUSUAL BRUISING OR BLEEDING  TENDERNESS IN MOUTH AND THROAT WITH OR WITHOUT PRESENCE OF ULCERS  *URINARY PROBLEMS  *BOWEL PROBLEMS  UNUSUAL RASH Items with * indicate a potential emergency and should be followed up as soon as possible.  Feel free to call the clinic you have any questions or concerns. The clinic phone number is [phone].   I have been informed and understand all the instructions given to me. I know to contact the clinic, my physician, or go to the Emergency Department if any problems should occur. I do not have any questions at this time, but understand that I may call the clinic during office hours   should I have any questions or need assistance in obtaining follow up care.    __________________________________________  _____________  __________ Signature of Patient or Authorized Representative            Date                   Time    __________________________________________ Nurse's Signature    

## 2012-06-22 ENCOUNTER — Ambulatory Visit (HOSPITAL_BASED_OUTPATIENT_CLINIC_OR_DEPARTMENT_OTHER): Payer: Medicare Other

## 2012-06-22 ENCOUNTER — Other Ambulatory Visit (HOSPITAL_BASED_OUTPATIENT_CLINIC_OR_DEPARTMENT_OTHER): Payer: Medicare Other | Admitting: Lab

## 2012-06-22 VITALS — BP 117/69 | HR 90 | Temp 97.7°F | Resp 18

## 2012-06-22 DIAGNOSIS — E1143 Type 2 diabetes mellitus with diabetic autonomic (poly)neuropathy: Secondary | ICD-10-CM

## 2012-06-22 DIAGNOSIS — C9002 Multiple myeloma in relapse: Secondary | ICD-10-CM

## 2012-06-22 DIAGNOSIS — E1321 Other specified diabetes mellitus with diabetic nephropathy: Secondary | ICD-10-CM

## 2012-06-22 DIAGNOSIS — Z5112 Encounter for antineoplastic immunotherapy: Secondary | ICD-10-CM

## 2012-06-22 DIAGNOSIS — C9000 Multiple myeloma not having achieved remission: Secondary | ICD-10-CM

## 2012-06-22 LAB — CBC WITH DIFFERENTIAL/PLATELET
BASO%: 0.2 % (ref 0.0–2.0)
EOS%: 2.4 % (ref 0.0–7.0)
MCH: 33.5 pg — ABNORMAL HIGH (ref 27.2–33.4)
MCV: 98.6 fL — ABNORMAL HIGH (ref 79.3–98.0)
MONO%: 7.3 % (ref 0.0–14.0)
RBC: 3.03 10*6/uL — ABNORMAL LOW (ref 4.20–5.82)
RDW: 17.5 % — ABNORMAL HIGH (ref 11.0–14.6)

## 2012-06-22 MED ORDER — ONDANSETRON HCL 8 MG PO TABS: 8.0000 mg | ORAL_TABLET | Freq: Once | ORAL | Status: DC

## 2012-06-22 MED ORDER — BORTEZOMIB CHEMO SQ INJECTION 3.5 MG (2.5MG/ML)
1.0000 mg/m2 | Freq: Once | INTRAMUSCULAR | Status: AC
  Administered 2012-06-22: 2.25 mg via SUBCUTANEOUS
  Filled 2012-06-22: qty 2.25

## 2012-06-22 NOTE — Patient Instructions (Signed)
[facility] Discharge Instructions for Patients Receiving Chemotherapy  Today you received the following chemotherapy agents Velcade.  To help prevent nausea and vomiting after your treatment, we encourage you to take your nausea medication as prescribed.   If you develop nausea and vomiting that is not controlled by your nausea medication, call the clinic. If it is after clinic hours your family physician or the after hours number for the clinic or go to the Emergency Department.   BELOW ARE SYMPTOMS THAT SHOULD BE REPORTED IMMEDIATELY:  *FEVER GREATER THAN 100.5 F  *CHILLS WITH OR WITHOUT FEVER  NAUSEA AND VOMITING THAT IS NOT CONTROLLED WITH YOUR NAUSEA MEDICATION  *UNUSUAL SHORTNESS OF BREATH  *UNUSUAL BRUISING OR BLEEDING  TENDERNESS IN MOUTH AND THROAT WITH OR WITHOUT PRESENCE OF ULCERS  *URINARY PROBLEMS  *BOWEL PROBLEMS  UNUSUAL RASH Items with * indicate a potential emergency and should be followed up as soon as possible.  One of the nurses will contact you 24 hours after your treatment. Please let the nurse know about any problems that you may have experienced. Feel free to call the clinic you have any questions or concerns. The clinic phone number is [phone].   I have been informed and understand all the instructions given to me. I know to contact the clinic, my physician, or go to the Emergency Department if any problems should occur. I do not have any questions at this time, but understand that I may call the clinic during office hours   should I have any questions or need assistance in obtaining follow up care.    __________________________________________  _____________  __________ Signature of Patient or Authorized Representative            Date                   Time    __________________________________________ Nurse's Signature    

## 2012-06-22 NOTE — Progress Notes (Signed)
Ok to treat with platelets 97 per Lonna Cobb, NP.  Patient has swelling, erythema on right upper arm from Velcade injection last week. Patient denies fever or pain in extremity. Patient instructed to apply cool compresses. Patient verbalized understanding.

## 2012-06-26 LAB — COMPREHENSIVE METABOLIC PANEL
AST: 16 U/L (ref 0–37)
Albumin: 3.6 g/dL (ref 3.5–5.2)
Alkaline Phosphatase: 51 U/L (ref 39–117)
Chloride: 105 mEq/L (ref 96–112)
Glucose, Bld: 182 mg/dL — ABNORMAL HIGH (ref 70–99)
Potassium: 4.1 mEq/L (ref 3.5–5.3)
Sodium: 140 mEq/L (ref 135–145)
Total Protein: 6.6 g/dL (ref 6.0–8.3)

## 2012-06-26 LAB — IMMUNOFIXATION ELECTROPHORESIS: IgA: 289 mg/dL (ref 68–379)

## 2012-06-26 LAB — KAPPA/LAMBDA LIGHT CHAINS: Lambda Free Lght Chn: 3.48 mg/dL — ABNORMAL HIGH (ref 0.57–2.63)

## 2012-06-29 ENCOUNTER — Other Ambulatory Visit (HOSPITAL_BASED_OUTPATIENT_CLINIC_OR_DEPARTMENT_OTHER): Payer: Medicare Other

## 2012-06-29 ENCOUNTER — Ambulatory Visit (HOSPITAL_BASED_OUTPATIENT_CLINIC_OR_DEPARTMENT_OTHER): Payer: Medicare Other

## 2012-06-29 VITALS — BP 131/63 | HR 72 | Temp 98.2°F | Resp 20

## 2012-06-29 DIAGNOSIS — Z5112 Encounter for antineoplastic immunotherapy: Secondary | ICD-10-CM

## 2012-06-29 DIAGNOSIS — C9000 Multiple myeloma not having achieved remission: Secondary | ICD-10-CM

## 2012-06-29 DIAGNOSIS — N08 Glomerular disorders in diseases classified elsewhere: Secondary | ICD-10-CM

## 2012-06-29 DIAGNOSIS — C9002 Multiple myeloma in relapse: Secondary | ICD-10-CM

## 2012-06-29 LAB — CBC & DIFF AND RETIC
BASO%: 0.2 % (ref 0.0–2.0)
Basophils Absolute: 0 10e3/uL (ref 0.0–0.1)
EOS%: 2.8 % (ref 0.0–7.0)
Eosinophils Absolute: 0.1 10e3/uL (ref 0.0–0.5)
HCT: 28.5 % — ABNORMAL LOW (ref 38.4–49.9)
HGB: 9.7 g/dL — ABNORMAL LOW (ref 13.0–17.1)
Immature Retic Fract: 19.2 % — ABNORMAL HIGH (ref 3.00–10.60)
LYMPH%: 13.6 % — ABNORMAL LOW (ref 14.0–49.0)
MCH: 32.4 pg (ref 27.2–33.4)
MCHC: 34 g/dL (ref 32.0–36.0)
MCV: 95.3 fL (ref 79.3–98.0)
MONO#: 0.4 10e3/uL (ref 0.1–0.9)
MONO%: 8.8 % (ref 0.0–14.0)
NEUT#: 3.5 10e3/uL (ref 1.5–6.5)
NEUT%: 74.6 % (ref 39.0–75.0)
Platelets: 74 10e3/uL — ABNORMAL LOW (ref 140–400)
RBC: 2.99 10e6/uL — ABNORMAL LOW (ref 4.20–5.82)
RDW: 16.1 % — ABNORMAL HIGH (ref 11.0–14.6)
Retic %: 2.38 % — ABNORMAL HIGH (ref 0.80–1.80)
Retic Ct Abs: 71.16 10e3/uL (ref 34.80–93.90)
WBC: 4.6 10e3/uL (ref 4.0–10.3)
lymph#: 0.6 10e3/uL — ABNORMAL LOW (ref 0.9–3.3)
nRBC: 0 % (ref 0–0)

## 2012-06-29 MED ORDER — ONDANSETRON HCL 8 MG PO TABS: 8.0000 mg | ORAL_TABLET | Freq: Once | ORAL | Status: DC

## 2012-06-29 MED ORDER — BORTEZOMIB CHEMO SQ INJECTION 3.5 MG (2.5MG/ML)
1.0000 mg/m2 | Freq: Once | INTRAMUSCULAR | Status: AC
  Administered 2012-06-29: 2.25 mg via SUBCUTANEOUS
  Filled 2012-06-29: qty 2.25

## 2012-06-29 MED ORDER — MELPHALAN 2 MG PO TABS: 5.0000 mg/m2 | ORAL_TABLET | Freq: Every day | ORAL | Status: AC

## 2012-06-29 NOTE — Progress Notes (Signed)
Platelets @ 74 today; okay to treat per Dr. Truett Perna.

## 2012-06-29 NOTE — Patient Instructions (Signed)
Patient aware of next appointment; discharged home with no complaints. 

## 2012-07-12 ENCOUNTER — Telehealth: Payer: Self-pay | Admitting: *Deleted

## 2012-07-12 NOTE — Telephone Encounter (Signed)
Received call from Jennifer/[employer] stating pt reports that he does not need alkeran & this has been stopped.  She would like to confirm this.  Based on last OV note, informed that this is true.

## 2012-07-13 ENCOUNTER — Ambulatory Visit: Payer: Medicare Other | Admitting: Nurse Practitioner

## 2012-07-16 ENCOUNTER — Ambulatory Visit (HOSPITAL_BASED_OUTPATIENT_CLINIC_OR_DEPARTMENT_OTHER): Payer: Medicare Other | Admitting: Nurse Practitioner

## 2012-07-16 ENCOUNTER — Telehealth: Payer: Self-pay | Admitting: Oncology

## 2012-07-16 ENCOUNTER — Telehealth: Payer: Self-pay | Admitting: *Deleted

## 2012-07-16 ENCOUNTER — Other Ambulatory Visit (HOSPITAL_BASED_OUTPATIENT_CLINIC_OR_DEPARTMENT_OTHER): Payer: Medicare Other | Admitting: Lab

## 2012-07-16 VITALS — BP 134/59 | HR 63 | Temp 97.6°F | Resp 20 | Ht 66.0 in | Wt 264.3 lb

## 2012-07-16 DIAGNOSIS — C9000 Multiple myeloma not having achieved remission: Secondary | ICD-10-CM

## 2012-07-16 DIAGNOSIS — N189 Chronic kidney disease, unspecified: Secondary | ICD-10-CM

## 2012-07-16 DIAGNOSIS — C9002 Multiple myeloma in relapse: Secondary | ICD-10-CM

## 2012-07-16 DIAGNOSIS — G609 Hereditary and idiopathic neuropathy, unspecified: Secondary | ICD-10-CM

## 2012-07-16 DIAGNOSIS — N049 Nephrotic syndrome with unspecified morphologic changes: Secondary | ICD-10-CM

## 2012-07-16 LAB — CBC & DIFF AND RETIC
BASO%: 0.2 % (ref 0.0–2.0)
Basophils Absolute: 0 10*3/uL (ref 0.0–0.1)
EOS%: 1.9 % (ref 0.0–7.0)
HCT: 30 % — ABNORMAL LOW (ref 38.4–49.9)
HGB: 10.2 g/dL — ABNORMAL LOW (ref 13.0–17.1)
MCH: 32.3 pg (ref 27.2–33.4)
MCHC: 34 g/dL (ref 32.0–36.0)
MCV: 94.9 fL (ref 79.3–98.0)
MONO%: 9.1 % (ref 0.0–14.0)
NEUT%: 77.4 % — ABNORMAL HIGH (ref 39.0–75.0)

## 2012-07-16 NOTE — Telephone Encounter (Signed)
Per staff message and POF I have scheduled appts.  JMW  

## 2012-07-16 NOTE — Telephone Encounter (Signed)
appts made and printed for pt William Gaines,pt aware that tx will follow

## 2012-07-16 NOTE — Progress Notes (Signed)
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 Initial diagnosis dates October 2007 when he presented with severe back pain and left hip pain. He was found to have a destructive lesion at L4 with biopsy showing a plasma cell infiltrate. He received initial palliative radiation to the spine and underwent a surgical stabilization procedure. He was started on a chemotherapy/immunotherapy program with a combination of Revlimid and dexamethasone. He was unable to tolerate the steroids due to advanced insulin-dependent diabetes. He had a nice response to treatment. He was also started on a monthly Zometa infusions. Initial treatment was continued through July 2009. He progressed in February 2012 with development of an asymptomatic lytic lesion in the left iliac bone and a rise in the urine protein level. He received radiation to the left hip in March 2012 and was started back on a chemotherapy program with oral melphalan and subcutaneous Velcade. Treatment was continued through August of this year with a good response.  He is seen today to discuss initiation of maintenance Revlimid. He overall does not feel well but does not have specific complaints. He mainly feels "tired". No interim illnesses or infections. Stable dyspnea on exertion. No cough. He has had recent significant improvement in the leg swelling. He attributes the improvement to "sleeping in the bed". He has stable mild numbness in the fingertips.   Objective: Blood pressure 134/59, pulse 63, temperature 97.6 F (36.4 C), temperature source Oral, resp. rate 20, height 5\' 6"  (1.676 m), weight 264 lb 4.8 oz (119.886 kg).  Oropharynx is without thrush or ulceration. Lungs are clear. Regular cardiac rhythm. Abdomen is soft and nontender. Obese. Ventral hernia. Trace lower leg edema bilaterally. Bilateral chronic venous stasis changes. Motor strength 5 over 5.  Vibratory sense is moderately decreased over the fingertips per tuning fork exam.  Lab Results: Lab Results  Component Value Date   WBC 4.8 07/16/2012   HGB 10.2* 07/16/2012   HCT 30.0* 07/16/2012   MCV 94.9 07/16/2012   PLT 82* 07/16/2012    Chemistry:    Chemistry      Component Value Date/Time   NA 140 06/22/2012 1158   NA 137 07/07/2011 1217   K 4.1 06/22/2012 1158   K 4.8* 07/07/2011 1217   CL 105 06/22/2012 1158   CL 99 07/07/2011 1217   CO2 27 06/22/2012 1158   CO2 29 07/07/2011 1217   BUN 46* 06/22/2012 1158   BUN 41* 07/07/2011 1217   CREATININE 2.23* 06/22/2012 1158   CREATININE 2.1* 07/07/2011 1217   CREATININE 1.90* 03/25/2011 1008   CREATININE 1.90* 03/25/2011 1008   CREATININE 1.90* 03/25/2011 1008   CREATININE 1.90* 03/25/2011 1008      Component Value Date/Time   CALCIUM 8.5 06/22/2012 1158   CALCIUM 8.7 07/07/2011 1217   ALKPHOS 51 06/22/2012 1158   AST 16 06/22/2012 1158   ALT 23 06/22/2012 1158   BILITOT 0.3 06/22/2012 1158       Studies/Results: No results found.  Medications: I have reviewed the patient's current medications.  Assessment/Plan:  1. Nonsecretory versus kappa light chain myeloma treated with melphalan and subcutaneous Velcade August 2012 to August 2013 with a good response. 2. Chronic idiopathic nephrotic syndrome likely due to diabetic nephrotic syndrome and component of light chain myeloma. Significant improvement with treatment of the myeloma. 3. Insulin-dependent diabetes. 4. Chronic renal insufficiency. 5. Peripheral neuropathy. 6. Obstructive airway disease.  Disposition-[name] appears stable.  Dr. Cyndie Chime recommends initiation of maintenance Revlimid at a dose of 10 mg daily. We discussed potential toxicities including myelosuppression, nausea, diarrhea or constipation, skin rash, blood clots, neuropathy symptoms. He is agreeable to proceed. We instructed him to begin aspirin 81 mg daily when he begins the Revlimid. We anticipate that he will  begin the Revlimid in approximately 1 week. He will return for a followup visit in 5 weeks. He will contact the office in the interim with any problems.  Plan per Dr. Cyndie Chime.  Lonna Cobb ANP/GNP-BC

## 2012-07-18 ENCOUNTER — Encounter: Payer: Self-pay | Admitting: Oncology

## 2012-07-18 NOTE — Progress Notes (Signed)
Faxed revlimid 10mg  30 tab prescription to [facility]. [employer] @  [id number] approved revlimid from 07/18/12-01/15/13 [id number] under Medicare Part D.  [facility] @ [facility] [phone] ext. [phone] to give her the pa info.  Patient's copay is $2365.35; she will call him to help him find copay assistance.

## 2012-07-19 ENCOUNTER — Telehealth: Payer: Self-pay | Admitting: *Deleted

## 2012-07-19 NOTE — Telephone Encounter (Signed)
Called pt after talking with [employer] & pt has to be re-enrolled in [employer].  The pt will come in tomorrow to fill out paper work (pt agreement form) & get med information.  Pt's wife reports that [employer] has called them & due to cost of med they are working on pt assistance & will call them back.  [facility] notified that we are working on re-enrollment.

## 2012-07-20 ENCOUNTER — Other Ambulatory Visit: Payer: Self-pay | Admitting: *Deleted

## 2012-07-20 NOTE — Telephone Encounter (Signed)
Authorization number for revlimid 10mg  #28. One po daily   #[id number].   Discussed with Axel Filler.  Waiting on [facility] to find co-pay assistance.

## 2012-07-25 ENCOUNTER — Telehealth: Payer: Self-pay | Admitting: *Deleted

## 2012-07-25 NOTE — Telephone Encounter (Signed)
Confirmation sent from [employer] that Revlimid has shipped on 07/24/12 for next day delivery.

## 2012-08-07 ENCOUNTER — Telehealth: Payer: Self-pay | Admitting: *Deleted

## 2012-08-07 NOTE — Telephone Encounter (Signed)
Patient called about going to the dentist.  William Gaines remembers Dr. Cyndie Chime telling him not to have dental work.   However, William Gaines has a tooth ache and wonders if William Gaines has an abscessed tooth.  Does William Gaines need to be on antibiotics?  Should William Gaines see dentist?

## 2012-08-08 NOTE — Telephone Encounter (Signed)
Called patient and let him know it is fine to go see dentist and he can put him on antibiotic if needed.  Patient states that his tooth hurt this am, but it is not hurting now.  Let him know that he should see dentist anyway to check out cause of pain.  His last labs showed that his platelets were low at 82K. Also patient is on revlimid and he says he got a letter saying he did not qualify for assistance.  He previously  Had received a letter saying he did qualify.  Referred him to Axel Filler to see what information she might have on this.

## 2012-08-17 ENCOUNTER — Other Ambulatory Visit (HOSPITAL_BASED_OUTPATIENT_CLINIC_OR_DEPARTMENT_OTHER): Payer: Medicare Other | Admitting: Lab

## 2012-08-17 DIAGNOSIS — C9000 Multiple myeloma not having achieved remission: Secondary | ICD-10-CM

## 2012-08-17 DIAGNOSIS — C9002 Multiple myeloma in relapse: Secondary | ICD-10-CM

## 2012-08-17 LAB — CBC WITH DIFFERENTIAL/PLATELET
Basophils Absolute: 0 10*3/uL (ref 0.0–0.1)
Eosinophils Absolute: 0.2 10*3/uL (ref 0.0–0.5)
HGB: 9.9 g/dL — ABNORMAL LOW (ref 13.0–17.1)
MCV: 98.3 fL — ABNORMAL HIGH (ref 79.3–98.0)
MONO#: 0.4 10*3/uL (ref 0.1–0.9)
NEUT#: 2.4 10*3/uL (ref 1.5–6.5)
RDW: 16.6 % — ABNORMAL HIGH (ref 11.0–14.6)
WBC: 3.4 10*3/uL — ABNORMAL LOW (ref 4.0–10.3)
lymph#: 0.4 10*3/uL — ABNORMAL LOW (ref 0.9–3.3)

## 2012-08-17 LAB — COMPREHENSIVE METABOLIC PANEL
ALT: 31 U/L (ref 0–53)
Albumin: 3.5 g/dL (ref 3.5–5.2)
BUN: 34 mg/dL — ABNORMAL HIGH (ref 6–23)
CO2: 25 mEq/L (ref 19–32)
Calcium: 8.4 mg/dL (ref 8.4–10.5)
Chloride: 103 mEq/L (ref 96–112)
Creatinine, Ser: 2.11 mg/dL — ABNORMAL HIGH (ref 0.50–1.35)
Potassium: 3.7 mEq/L (ref 3.5–5.3)

## 2012-08-17 LAB — LACTATE DEHYDROGENASE: LDH: 76 U/L — ABNORMAL LOW (ref 94–250)

## 2012-08-20 ENCOUNTER — Other Ambulatory Visit: Payer: Self-pay | Admitting: *Deleted

## 2012-08-20 NOTE — Telephone Encounter (Signed)
THIS REFILL REQUEST FOR REVLIMID WAS PLACED IN DR.GRANFORTUNA'S ACTIVE WORK BOX. 

## 2012-08-21 ENCOUNTER — Other Ambulatory Visit: Payer: Self-pay | Admitting: *Deleted

## 2012-08-21 DIAGNOSIS — C9000 Multiple myeloma not having achieved remission: Secondary | ICD-10-CM

## 2012-08-21 LAB — KAPPA/LAMBDA LIGHT CHAINS
Kappa free light chain: 12.3 mg/dL — ABNORMAL HIGH (ref 0.33–1.94)
Kappa:Lambda Ratio: 1.55 (ref 0.26–1.65)
Lambda Free Lght Chn: 7.91 mg/dL — ABNORMAL HIGH (ref 0.57–2.63)

## 2012-08-21 LAB — UIFE/LIGHT CHAINS/TP QN, 24-HR UR
Alpha 1, Urine: DETECTED — AB
Alpha 2, Urine: DETECTED — AB
Beta, Urine: DETECTED — AB
Free Kappa Lt Chains,Ur: 22.3 mg/dL — ABNORMAL HIGH (ref 0.14–2.42)
Free Lt Chn Excr Rate: 713.6 mg/d

## 2012-08-21 LAB — IGG, IGA, IGM
IgA: 357 mg/dL (ref 68–379)
IgG (Immunoglobin G), Serum: 1460 mg/dL (ref 650–1600)
IgM, Serum: 46 mg/dL (ref 41–251)

## 2012-08-21 MED ORDER — LENALIDOMIDE 10 MG PO CAPS: 10.0000 mg | ORAL_CAPSULE | Freq: Every day | ORAL | Status: DC

## 2012-08-23 NOTE — Telephone Encounter (Signed)
RECEIVED A FAX FROM [facility] CONCERNING A CONFIRMATION OF PRESCRIPTION SHIPMENT FOR REVLIMID ON 08/22/12.

## 2012-08-24 ENCOUNTER — Ambulatory Visit (HOSPITAL_BASED_OUTPATIENT_CLINIC_OR_DEPARTMENT_OTHER): Payer: Medicare Other

## 2012-08-24 ENCOUNTER — Ambulatory Visit (HOSPITAL_BASED_OUTPATIENT_CLINIC_OR_DEPARTMENT_OTHER): Payer: Medicare Other | Admitting: Oncology

## 2012-08-24 ENCOUNTER — Telehealth: Payer: Self-pay | Admitting: *Deleted

## 2012-08-24 ENCOUNTER — Telehealth: Payer: Self-pay | Admitting: Oncology

## 2012-08-24 VITALS — BP 154/59 | HR 65 | Temp 97.1°F | Resp 22 | Ht 66.0 in | Wt 263.8 lb

## 2012-08-24 DIAGNOSIS — C9000 Multiple myeloma not having achieved remission: Secondary | ICD-10-CM

## 2012-08-24 DIAGNOSIS — C9002 Multiple myeloma in relapse: Secondary | ICD-10-CM

## 2012-08-24 DIAGNOSIS — E1129 Type 2 diabetes mellitus with other diabetic kidney complication: Secondary | ICD-10-CM

## 2012-08-24 DIAGNOSIS — N189 Chronic kidney disease, unspecified: Secondary | ICD-10-CM

## 2012-08-24 DIAGNOSIS — N049 Nephrotic syndrome with unspecified morphologic changes: Secondary | ICD-10-CM

## 2012-08-24 LAB — CBC WITH DIFFERENTIAL/PLATELET
BASO%: 4.6 % — ABNORMAL HIGH (ref 0.0–2.0)
EOS%: 6.1 % (ref 0.0–7.0)
HCT: 30.6 % — ABNORMAL LOW (ref 38.4–49.9)
LYMPH%: 13.6 % — ABNORMAL LOW (ref 14.0–49.0)
MCH: 33 pg (ref 27.2–33.4)
MCHC: 34.2 g/dL (ref 32.0–36.0)
MCV: 96.5 fL (ref 79.3–98.0)
MONO%: 9.1 % (ref 0.0–14.0)
NEUT%: 66.6 % (ref 39.0–75.0)
Platelets: 125 10*3/uL — ABNORMAL LOW (ref 140–400)

## 2012-08-24 MED ORDER — ZOLEDRONIC ACID 4 MG/5ML IV CONC
4.0000 mg | Freq: Once | INTRAVENOUS | Status: AC
  Administered 2012-08-24: 4 mg via INTRAVENOUS
  Filled 2012-08-24: qty 5

## 2012-08-24 MED ORDER — SODIUM CHLORIDE 0.9 % IV SOLN
Freq: Once | INTRAVENOUS | Status: AC
  Administered 2012-08-24: 14:00:00 via INTRAVENOUS

## 2012-08-24 NOTE — Patient Instructions (Addendum)
Zoledronic Acid injection (Hypercalcemia, Oncology) What is this medicine? ZOLEDRONIC ACID (ZOE le dron ik AS id) Zometa lowers the amount of calcium loss from bone. It is used to treat too much calcium in your blood from cancer. It is also used to prevent complications of cancer that has spread to the bone. This medicine may be used for other purposes; ask your health care provider or pharmacist if you have questions. What should I tell my health care provider before I take this medicine? They need to know if you have any of these conditions: -aspirin-sensitive asthma -dental disease -kidney disease -an unusual or allergic reaction to zoledronic acid, other medicines, foods, dyes, or preservatives -pregnant or trying to get pregnant -breast-feeding How should I use this medicine? This medicine is for infusion into a vein. It is given by a health care professional in a hospital or clinic setting. Talk to your pediatrician regarding the use of this medicine in children. Special care may be needed. Overdosage: If you think you have taken too much of this medicine contact a poison control center or emergency room at once. NOTE: This medicine is only for you. Do not share this medicine with others. What if I miss a dose? It is important not to miss your dose. Call your doctor or health care professional if you are unable to keep an appointment. What may interact with this medicine? -certain antibiotics given by injection -NSAIDs, medicines for pain and inflammation, like ibuprofen or naproxen -some diuretics like bumetanide, furosemide -teriparatide -thalidomide This list may not describe all possible interactions. Give your health care provider a list of all the medicines, herbs, non-prescription drugs, or dietary supplements you use. Also tell them if you smoke, drink alcohol, or use illegal drugs. Some items may interact with your medicine. What should I watch for while using this  medicine? Visit your doctor or health care professional for regular checkups. It may be some time before you see the benefit from this medicine. Do not stop taking your medicine unless your doctor tells you to. Your doctor may order blood tests or other tests to see how you are doing. Women should inform their doctor if they wish to become pregnant or think they might be pregnant. There is a potential for serious side effects to an unborn child. Talk to your health care professional or pharmacist for more information. You should make sure that you get enough calcium and vitamin D while you are taking this medicine. Discuss the foods you eat and the vitamins you take with your health care professional. Some people who take this medicine have severe bone, joint, and/or muscle pain. This medicine may also increase your risk for a broken thigh bone. Tell your doctor right away if you have pain in your upper leg or groin. Tell your doctor if you have any pain that does not go away or that gets worse. What side effects may I notice from receiving this medicine? Side effects that you should report to your doctor or health care professional as soon as possible: -allergic reactions like skin rash, itching or hives, swelling of the face, lips, or tongue -anxiety, confusion, or depression -breathing problems -changes in vision -feeling faint or lightheaded, falls -jaw burning, cramping, pain -muscle cramps, stiffness, or weakness -trouble passing urine or change in the amount of urine Side effects that usually do not require medical attention (report to your doctor or health care professional if they continue or are bothersome): -bone, joint, or muscle  pain -fever -hair loss -irritation at site where injected -loss of appetite -nausea, vomiting -stomach upset -tired This list may not describe all possible side effects. Call your doctor for medical advice about side effects. You may report side effects  to FDA at 1-800-FDA-1088. Where should I keep my medicine? This drug is given in a hospital or clinic and will not be stored at home. NOTE: This sheet is a summary. It may not cover all possible information. If you have questions about this medicine, talk to your doctor, pharmacist, or health care provider.  2012, [employer] Standard. (04/22/2011 9:06:58 AM)

## 2012-08-24 NOTE — Progress Notes (Signed)
Hematology and Oncology Follow Up Visit  William Gaines [id number] April 23, 1941 71 y.o. 08/24/2012 6:45 PM   Principle Diagnosis: Encounter Diagnoses  Name Primary?  . Multiple myeloma without mention of remission Yes  . 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 He had long-standing proteinuria ante-dating the diagnoses of his multiple myeloma and urine studies have been difficult to interpret due to this. He received initial palliative radiation to the spine and then underwent a surgical stabilization procedure. He was started on a chemotherapy/immunotherapy program with a combination of Revlimid plus dexamethasone. He was unable to tolerate the steroids do to his advanced insulin-dependent diabetes. He did however have a nice response to the treatment. He was also put on monthly bisphosphonate infusions with Zometa. Initial treatment continued through July 2009. He progressed in February 2012 with development of an asymptomatic lytic lesion in the left iliac bone and a rise in his urine protein level. He received additional radiation at that time to his left hip in March of 2012 and was started back on a chemotherapy program with oral melphalan plus subcutaneous Velcade. Once again he had a response to treatment which I have continued up until recently . Although there was a suspicion of progression on CT scans of his spine obtained when he had a flare up of chronic back pain in February of this year, there were no progressive changes on a regular skeletal bone survey and his pain level subsided back to his baseline. No additional changes were made in his treatment program. He again reached a plateau phase  in his response. I elected to stop the melphalan and prednisone in August and start him on maintenance Revlimid 10 mg daily. Overall he has tolerated this well but notes increased fatigue. He has had only a modest fall in his counts. He denies any neuropathy related to the drug but tells me that his left hand has been chronically numb.  He has had no interim infections.   Medications: reviewed  Allergies:  Allergies  Allergen Reactions  . Codeine     hallucinations  . Lorazepam (Lorazepam) Anxiety    Paradoxical agitation when given as pre med for bone marrow biopsy 09/11/06    Review of Systems: Constitutional:   See above Respiratory: No cough or dyspnea Cardiovascular:  No chest pain Gastrointestinal: No abdominal pain no change in bowel habit Genito-Urinary: No urinary tract symptoms Musculoskeletal: Chronic low back pain. No new areas of pain Neurologic: No headache or change in vision Skin: No rash or ecchymosis Remaining ROS negative.  Physical Exam: Blood pressure 154/59, pulse 65, temperature 97.1 F (36.2 C), temperature source Oral, resp. rate 22, height 5\' 6"  (1.676 m), weight 263 lb 12.8 oz (119.659 kg). Wt Readings from Last 3 Encounters:  08/24/12 263 lb 12.8 oz (119.659 kg)  07/16/12 264 lb 4.8 oz (119.886 kg)  06/15/12 262 lb 14.4 oz (119.251 kg)     General appearance: Obese Caucasian man HENNT: Pharynx no erythema, exudate, or ulcer Lymph nodes: No adenopathy Breasts: Lungs: Clear to auscultation resonant to percussion Heart: Regular rhythm, 2/6 systolic murmur at the left sternal border Abdomen: Soft, obese, ventral hernia Extremities: 1+ pedal edema Vascular: No cyanosis  Neurologic: Motor strength 5 over 5, reflexes 1+ symmetric, sensation moderately to severely decreased over the fingertips by tuning fork exam Skin: No rash or ecchymosis  Lab Results: Lab Results  Component Value Date   WBC 3.3* 08/24/2012   HGB 10.5* 08/24/2012   HCT 30.6*  08/24/2012   MCV 96.5 08/24/2012   PLT 125* 08/24/2012     Chemistry      Component Value Date/Time   NA 140 08/17/2012 1356   NA 137 07/07/2011 1217   K 3.7 08/17/2012 1356   K 4.8* 07/07/2011 1217   CL 103 08/17/2012 1356   CL 99 07/07/2011 1217   CO2 25 08/17/2012 1356   CO2 29 07/07/2011 1217   BUN 34* 08/17/2012 1356   BUN 41* 07/07/2011 1217   CREATININE 2.11* 08/17/2012 1356   CREATININE 2.1* 07/07/2011 1217   CREATININE 1.90* 03/25/2011 1008   CREATININE 1.90* 03/25/2011 1008   CREATININE 1.90* 03/25/2011 1008   CREATININE 1.90* 03/25/2011 1008      Component Value Date/Time   CALCIUM 8.4 08/17/2012 1356   CALCIUM 8.7 07/07/2011 1217   ALKPHOS 54 08/17/2012 1356   AST 17 08/17/2012 1356   ALT 31 08/17/2012 1356   BILITOT 0.3 08/17/2012 1356    Kappa free light chains 12.3 mg percent, lambda 7.91, ratio 1.55 done 08/17/2012 (normal 0.26-1.65) Most recent 24 hour urine total protein stable at 1.5 g on 08/17/2012   Impression and Plan:  #1. 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  #2. 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 Significant response to anti-myeloma therapy.   #3. Insulin-dependent diabetes.   #4. Chronic renal insufficiency secondary to #12 and 3. Creatinine is stable to decreased compare with prior values.   #5. 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   #6. Obstructive airway disease   CC: Dr. Lyn Hollingshead; Dr. Danella Sensing, MD 10/18/20136:45 PM

## 2012-08-24 NOTE — Telephone Encounter (Signed)
Per staff message and POF I have scheduled appts.  JMW  

## 2012-08-24 NOTE — Telephone Encounter (Signed)
gve the pt his oct-08/2013 appt calendars. Sent michelle a staff message.   Pt is aware to get a schedule from the back

## 2012-08-31 ENCOUNTER — Other Ambulatory Visit (HOSPITAL_BASED_OUTPATIENT_CLINIC_OR_DEPARTMENT_OTHER): Payer: Medicare Other | Admitting: Lab

## 2012-08-31 DIAGNOSIS — C9002 Multiple myeloma in relapse: Secondary | ICD-10-CM

## 2012-08-31 DIAGNOSIS — N049 Nephrotic syndrome with unspecified morphologic changes: Secondary | ICD-10-CM

## 2012-08-31 DIAGNOSIS — M545 Low back pain: Secondary | ICD-10-CM

## 2012-08-31 DIAGNOSIS — C9000 Multiple myeloma not having achieved remission: Secondary | ICD-10-CM

## 2012-08-31 DIAGNOSIS — M8448XA Pathological fracture, other site, initial encounter for fracture: Secondary | ICD-10-CM

## 2012-08-31 DIAGNOSIS — N189 Chronic kidney disease, unspecified: Secondary | ICD-10-CM

## 2012-08-31 LAB — CBC & DIFF AND RETIC
BASO%: 4.4 % — ABNORMAL HIGH (ref 0.0–2.0)
EOS%: 8 % — ABNORMAL HIGH (ref 0.0–7.0)
HCT: 29.6 % — ABNORMAL LOW (ref 38.4–49.9)
MCH: 31.2 pg (ref 27.2–33.4)
MCHC: 32.8 g/dL (ref 32.0–36.0)
NEUT%: 60.9 % (ref 39.0–75.0)
RBC: 3.11 10*6/uL — ABNORMAL LOW (ref 4.20–5.82)
Retic Ct Abs: 67.18 10*3/uL (ref 34.80–93.90)
lymph#: 0.4 10*3/uL — ABNORMAL LOW (ref 0.9–3.3)

## 2012-09-07 ENCOUNTER — Other Ambulatory Visit (HOSPITAL_BASED_OUTPATIENT_CLINIC_OR_DEPARTMENT_OTHER): Payer: Medicare Other

## 2012-09-07 DIAGNOSIS — N08 Glomerular disorders in diseases classified elsewhere: Secondary | ICD-10-CM

## 2012-09-07 DIAGNOSIS — M545 Low back pain: Secondary | ICD-10-CM

## 2012-09-07 DIAGNOSIS — M8448XA Pathological fracture, other site, initial encounter for fracture: Secondary | ICD-10-CM

## 2012-09-07 DIAGNOSIS — N049 Nephrotic syndrome with unspecified morphologic changes: Secondary | ICD-10-CM

## 2012-09-07 DIAGNOSIS — C9000 Multiple myeloma not having achieved remission: Secondary | ICD-10-CM

## 2012-09-07 LAB — CBC WITH DIFFERENTIAL/PLATELET
EOS%: 11 % — ABNORMAL HIGH (ref 0.0–7.0)
MCH: 32.3 pg (ref 27.2–33.4)
MCHC: 33.6 g/dL (ref 32.0–36.0)
MCV: 96.2 fL (ref 79.3–98.0)
MONO%: 8.7 % (ref 0.0–14.0)
RBC: 3.16 10*6/uL — ABNORMAL LOW (ref 4.20–5.82)
RDW: 16.8 % — ABNORMAL HIGH (ref 11.0–14.6)

## 2012-09-13 ENCOUNTER — Telehealth: Payer: Self-pay | Admitting: *Deleted

## 2012-09-13 NOTE — Telephone Encounter (Signed)
Call received from Dr. Louis Meckel.  This patient is in her office.  Call was to ask if Myeloma patient's receiving revlimid may receive the flu vaccine.  Consulted with pharmacist who says yes they may receive inactivated.  Notified Dr. Louis Meckel.  Patient is going to receive flu vaccine wit this visit.

## 2012-09-14 ENCOUNTER — Other Ambulatory Visit: Payer: Medicare Other | Admitting: Lab

## 2012-09-14 DIAGNOSIS — N049 Nephrotic syndrome with unspecified morphologic changes: Secondary | ICD-10-CM

## 2012-09-14 DIAGNOSIS — M545 Low back pain: Secondary | ICD-10-CM

## 2012-09-14 DIAGNOSIS — M8448XA Pathological fracture, other site, initial encounter for fracture: Secondary | ICD-10-CM

## 2012-09-14 DIAGNOSIS — C9000 Multiple myeloma not having achieved remission: Secondary | ICD-10-CM

## 2012-09-14 DIAGNOSIS — N08 Glomerular disorders in diseases classified elsewhere: Secondary | ICD-10-CM

## 2012-09-14 LAB — CBC WITH DIFFERENTIAL/PLATELET
Basophils Absolute: 0.1 10*3/uL (ref 0.0–0.1)
EOS%: 15 % — ABNORMAL HIGH (ref 0.0–7.0)
HCT: 29.8 % — ABNORMAL LOW (ref 38.4–49.9)
HGB: 10.1 g/dL — ABNORMAL LOW (ref 13.0–17.1)
MCH: 32.1 pg (ref 27.2–33.4)
MCV: 95 fL (ref 79.3–98.0)
MONO%: 11.9 % (ref 0.0–14.0)
NEUT%: 53.4 % (ref 39.0–75.0)
RDW: 16.7 % — ABNORMAL HIGH (ref 11.0–14.6)

## 2012-09-17 ENCOUNTER — Other Ambulatory Visit: Payer: Self-pay | Admitting: *Deleted

## 2012-09-17 NOTE — Telephone Encounter (Signed)
THIS REFILL REQUEST FOR REVLIMID WAS PLACED IN DR.GRANFORTUNA'S ACTIVE WORK BOX. 

## 2012-09-18 ENCOUNTER — Other Ambulatory Visit: Payer: Self-pay | Admitting: *Deleted

## 2012-09-18 DIAGNOSIS — C9000 Multiple myeloma not having achieved remission: Secondary | ICD-10-CM

## 2012-09-18 MED ORDER — LENALIDOMIDE 10 MG PO CAPS: 10.0000 mg | ORAL_CAPSULE | Freq: Every day | ORAL | Status: DC

## 2012-09-19 ENCOUNTER — Encounter: Payer: Self-pay | Admitting: *Deleted

## 2012-09-19 NOTE — Progress Notes (Signed)
Confirmation from [facility] received that script for Revlimid has been received and is being processed.

## 2012-09-20 NOTE — Telephone Encounter (Signed)
[facility] faxed confirmation of prescription shipment.  Revlimid shipped 09-19-2012 with next business day delivery.

## 2012-09-21 ENCOUNTER — Ambulatory Visit (HOSPITAL_BASED_OUTPATIENT_CLINIC_OR_DEPARTMENT_OTHER): Payer: Medicare Other | Admitting: Nurse Practitioner

## 2012-09-21 ENCOUNTER — Telehealth: Payer: Self-pay | Admitting: Oncology

## 2012-09-21 ENCOUNTER — Other Ambulatory Visit: Payer: Medicare Other

## 2012-09-21 ENCOUNTER — Other Ambulatory Visit (HOSPITAL_BASED_OUTPATIENT_CLINIC_OR_DEPARTMENT_OTHER): Payer: Medicare Other | Admitting: Lab

## 2012-09-21 VITALS — BP 144/64 | HR 83 | Temp 96.9°F | Resp 20 | Ht 66.0 in | Wt 262.0 lb

## 2012-09-21 DIAGNOSIS — N08 Glomerular disorders in diseases classified elsewhere: Secondary | ICD-10-CM

## 2012-09-21 DIAGNOSIS — C9002 Multiple myeloma in relapse: Secondary | ICD-10-CM

## 2012-09-21 DIAGNOSIS — M545 Low back pain: Secondary | ICD-10-CM

## 2012-09-21 DIAGNOSIS — M8448XA Pathological fracture, other site, initial encounter for fracture: Secondary | ICD-10-CM

## 2012-09-21 DIAGNOSIS — C9000 Multiple myeloma not having achieved remission: Secondary | ICD-10-CM

## 2012-09-21 DIAGNOSIS — N049 Nephrotic syndrome with unspecified morphologic changes: Secondary | ICD-10-CM

## 2012-09-21 LAB — COMPREHENSIVE METABOLIC PANEL (CC13)
Alkaline Phosphatase: 59 U/L (ref 40–150)
BUN: 52 mg/dL — ABNORMAL HIGH (ref 7.0–26.0)
Creatinine: 2.7 mg/dL — ABNORMAL HIGH (ref 0.7–1.3)
Glucose: 179 mg/dl — ABNORMAL HIGH (ref 70–99)
Sodium: 143 mEq/L (ref 136–145)
Total Bilirubin: 0.41 mg/dL (ref 0.20–1.20)
Total Protein: 6.9 g/dL (ref 6.4–8.3)

## 2012-09-21 LAB — CBC WITH DIFFERENTIAL/PLATELET
BASO%: 4.3 % — ABNORMAL HIGH (ref 0.0–2.0)
Eosinophils Absolute: 0.3 10*3/uL (ref 0.0–0.5)
MONO#: 0.3 10*3/uL (ref 0.1–0.9)
MONO%: 10.8 % (ref 0.0–14.0)
NEUT#: 1.5 10*3/uL (ref 1.5–6.5)
RBC: 3.05 10*6/uL — ABNORMAL LOW (ref 4.20–5.82)
RDW: 17 % — ABNORMAL HIGH (ref 11.0–14.6)
WBC: 2.7 10*3/uL — ABNORMAL LOW (ref 4.0–10.3)

## 2012-09-21 NOTE — Telephone Encounter (Signed)
Gave pt appt calendar for November and December 2013 lab, MD and infusion

## 2012-09-21 NOTE — Progress Notes (Signed)
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 Initial diagnosis dates October 2007 when he presented with severe back pain and left hip pain. He was found to have a destructive lesion at L4 with biopsy showing a plasma cell infiltrate. He received initial palliative radiation to the spine and underwent a surgical stabilization procedure. He was started on a chemotherapy/immunotherapy program with a combination of Revlimid and dexamethasone. He was unable to tolerate the steroids due to advanced insulin-dependent diabetes. He had a nice response to treatment. He was also started on a monthly Zometa infusions. Initial treatment was continued through July 2009. He progressed in February 2012 with development of an asymptomatic lytic lesion in the left iliac bone and a rise in the urine protein level. He received radiation to the left hip in March 2012 and was started back on a chemotherapy program with oral melphalan and subcutaneous Velcade. Treatment was continued through August of this year with a good response. He began maintenance Revlimid 10 mg daily following an office visit on 07/16/2012.  William Gaines reports a poor energy level. Chronic back pain is unchanged. He has stable numbness in the hands and feet. He has noted no increase since beginning Revlimid. He denies nausea/vomiting. No constipation. No mouth sores. No skin rash.   Objective: Blood pressure 144/64, pulse 83, temperature 96.9 F (36.1 C), temperature source Oral, resp. rate 20, height 5\' 6"  (1.676 m), weight 262 lb (118.842 kg).  Oropharynx is without thrush or ulceration. No palpable cervical, supraclavicular or axillary lymph nodes. Lungs are clear. Regular cardiac rhythm. Abdomen is soft, obese. 1+ lower leg edema bilaterally. Chronic venous stasis changes. Motor strength 5 over 5. Vibratory sense is moderately decreased over the fingertips per  tuning fork exam.  Lab Results: Lab Results  Component Value Date   WBC 2.7* 09/21/2012   HGB 9.8* 09/21/2012   HCT 29.1* 09/21/2012   MCV 95.4 09/21/2012   PLT 89* 09/21/2012    Chemistry:    Chemistry      Component Value Date/Time   NA 143 09/21/2012 1307   NA 140 08/17/2012 1356   NA 137 07/07/2011 1217   K 3.9 09/21/2012 1307   K 3.7 08/17/2012 1356   K 4.8* 07/07/2011 1217   CL 109* 09/21/2012 1307   CL 103 08/17/2012 1356   CL 99 07/07/2011 1217   CO2 27 09/21/2012 1307   CO2 25 08/17/2012 1356   CO2 29 07/07/2011 1217   BUN 52.0* 09/21/2012 1307   BUN 34* 08/17/2012 1356   BUN 41* 07/07/2011 1217   CREATININE 2.7* 09/21/2012 1307   CREATININE 2.11* 08/17/2012 1356   CREATININE 2.1* 07/07/2011 1217   CREATININE 1.90* 03/25/2011 1008   CREATININE 1.90* 03/25/2011 1008   CREATININE 1.90* 03/25/2011 1008   CREATININE 1.90* 03/25/2011 1008      Component Value Date/Time   CALCIUM 8.2* 09/21/2012 1307   CALCIUM 8.4 08/17/2012 1356   CALCIUM 8.7 07/07/2011 1217   ALKPHOS 59 09/21/2012 1307   ALKPHOS 54 08/17/2012 1356   AST 21 09/21/2012 1307   AST 17 08/17/2012 1356   ALT 38 09/21/2012 1307   ALT 31 08/17/2012 1356   BILITOT 0.41 09/21/2012 1307   BILITOT 0.3 08/17/2012 1356       Studies/Results: No results found.  Medications: I have reviewed the patient's current medications.  Assessment/Plan:  1. 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 He was most recently treated with melphalan and subcutaneous Velcade August 2012 to August 2013 with a good response and began maintenance Revlimid in September 2013. 2. Chronic idiopathic nephrotic syndrome likely due to diabetic nephrotic syndrome and component of light chain myeloma. Significant improvement with treatment of the myeloma. 3. Insulin-dependent diabetes. 4. Chronic renal insufficiency secondary to #2 and #3. 5. Peripheral neuropathy. 6. Obstructive airway  disease.  Disposition-William Gaines appears stable. He will continue maintenance Revlimid. He will return for a followup visit in one month. He will contact the office in the interim with any problems.   Of note, he is receiving Zometa on a 6 month schedule with the last infusion on 08/24/2012.   Plan reviewed with Dr. Cyndie Chime.  Lonna Cobb ANP/GNP-BC

## 2012-09-24 LAB — KAPPA/LAMBDA LIGHT CHAINS
Kappa:Lambda Ratio: 1.93 — ABNORMAL HIGH (ref 0.26–1.65)
Lambda Free Lght Chn: 12.5 mg/dL — ABNORMAL HIGH (ref 0.57–2.63)

## 2012-09-28 ENCOUNTER — Other Ambulatory Visit (HOSPITAL_BASED_OUTPATIENT_CLINIC_OR_DEPARTMENT_OTHER): Payer: Medicare Other | Admitting: Lab

## 2012-09-28 DIAGNOSIS — M545 Low back pain: Secondary | ICD-10-CM

## 2012-09-28 DIAGNOSIS — N08 Glomerular disorders in diseases classified elsewhere: Secondary | ICD-10-CM

## 2012-09-28 DIAGNOSIS — N049 Nephrotic syndrome with unspecified morphologic changes: Secondary | ICD-10-CM

## 2012-09-28 DIAGNOSIS — M8448XA Pathological fracture, other site, initial encounter for fracture: Secondary | ICD-10-CM

## 2012-09-28 DIAGNOSIS — C9000 Multiple myeloma not having achieved remission: Secondary | ICD-10-CM

## 2012-09-28 DIAGNOSIS — N2889 Other specified disorders of kidney and ureter: Secondary | ICD-10-CM

## 2012-09-28 LAB — CBC WITH DIFFERENTIAL/PLATELET
Basophils Absolute: 0.1 10*3/uL (ref 0.0–0.1)
EOS%: 12 % — ABNORMAL HIGH (ref 0.0–7.0)
Eosinophils Absolute: 0.3 10*3/uL (ref 0.0–0.5)
HCT: 30.6 % — ABNORMAL LOW (ref 38.4–49.9)
HGB: 10.2 g/dL — ABNORMAL LOW (ref 13.0–17.1)
MCH: 31.3 pg (ref 27.2–33.4)
MCV: 94.4 fL (ref 79.3–98.0)
MONO%: 10 % (ref 0.0–14.0)
NEUT%: 57 % (ref 39.0–75.0)
lymph#: 0.5 10*3/uL — ABNORMAL LOW (ref 0.9–3.3)

## 2012-10-05 ENCOUNTER — Other Ambulatory Visit (HOSPITAL_BASED_OUTPATIENT_CLINIC_OR_DEPARTMENT_OTHER): Payer: Medicare Other | Admitting: Lab

## 2012-10-05 DIAGNOSIS — M8448XA Pathological fracture, other site, initial encounter for fracture: Secondary | ICD-10-CM

## 2012-10-05 DIAGNOSIS — C9000 Multiple myeloma not having achieved remission: Secondary | ICD-10-CM

## 2012-10-05 DIAGNOSIS — M545 Low back pain: Secondary | ICD-10-CM

## 2012-10-05 DIAGNOSIS — N049 Nephrotic syndrome with unspecified morphologic changes: Secondary | ICD-10-CM

## 2012-10-05 LAB — CBC WITH DIFFERENTIAL/PLATELET
Basophils Absolute: 0.1 10*3/uL (ref 0.0–0.1)
EOS%: 14.9 % — ABNORMAL HIGH (ref 0.0–7.0)
HGB: 9.9 g/dL — ABNORMAL LOW (ref 13.0–17.1)
LYMPH%: 18 % (ref 14.0–49.0)
MCH: 30.2 pg (ref 27.2–33.4)
MCV: 93.9 fL (ref 79.3–98.0)
MONO%: 10.6 % (ref 0.0–14.0)
Platelets: 93 10*3/uL — ABNORMAL LOW (ref 140–400)
RDW: 15.5 % — ABNORMAL HIGH (ref 11.0–14.6)

## 2012-10-12 ENCOUNTER — Other Ambulatory Visit (HOSPITAL_BASED_OUTPATIENT_CLINIC_OR_DEPARTMENT_OTHER): Payer: Medicare Other | Admitting: Lab

## 2012-10-12 DIAGNOSIS — N08 Glomerular disorders in diseases classified elsewhere: Secondary | ICD-10-CM

## 2012-10-12 DIAGNOSIS — N049 Nephrotic syndrome with unspecified morphologic changes: Secondary | ICD-10-CM

## 2012-10-12 DIAGNOSIS — M545 Low back pain: Secondary | ICD-10-CM

## 2012-10-12 DIAGNOSIS — C9000 Multiple myeloma not having achieved remission: Secondary | ICD-10-CM

## 2012-10-12 DIAGNOSIS — M533 Sacrococcygeal disorders, not elsewhere classified: Secondary | ICD-10-CM

## 2012-10-12 DIAGNOSIS — N2889 Other specified disorders of kidney and ureter: Secondary | ICD-10-CM

## 2012-10-12 DIAGNOSIS — M8448XA Pathological fracture, other site, initial encounter for fracture: Secondary | ICD-10-CM

## 2012-10-12 LAB — CBC WITH DIFFERENTIAL/PLATELET
Eosinophils Absolute: 0.3 10*3/uL (ref 0.0–0.5)
HCT: 30.2 % — ABNORMAL LOW (ref 38.4–49.9)
LYMPH%: 21 % (ref 14.0–49.0)
MONO#: 0.2 10*3/uL (ref 0.1–0.9)
NEUT#: 1.3 10*3/uL — ABNORMAL LOW (ref 1.5–6.5)
Platelets: 84 10*3/uL — ABNORMAL LOW (ref 140–400)
RBC: 3.24 10*6/uL — ABNORMAL LOW (ref 4.20–5.82)
WBC: 2.5 10*3/uL — ABNORMAL LOW (ref 4.0–10.3)
lymph#: 0.5 10*3/uL — ABNORMAL LOW (ref 0.9–3.3)

## 2012-10-17 ENCOUNTER — Telehealth: Payer: Self-pay | Admitting: *Deleted

## 2012-10-17 DIAGNOSIS — C9000 Multiple myeloma not having achieved remission: Secondary | ICD-10-CM

## 2012-10-17 MED ORDER — LENALIDOMIDE 10 MG PO CAPS: 10.0000 mg | ORAL_CAPSULE | Freq: Every day | ORAL | Status: DC

## 2012-10-17 NOTE — Telephone Encounter (Signed)
[facility] faxed refill request for Revlimid.  Request to MD for review. 

## 2012-10-17 NOTE — Addendum Note (Signed)
Addended by: Laroy Apple E on: 10/17/2012 03:32 PM   Modules accepted: Orders

## 2012-10-19 ENCOUNTER — Other Ambulatory Visit (HOSPITAL_BASED_OUTPATIENT_CLINIC_OR_DEPARTMENT_OTHER): Payer: Medicare Other | Admitting: Lab

## 2012-10-19 DIAGNOSIS — C9000 Multiple myeloma not having achieved remission: Secondary | ICD-10-CM

## 2012-10-19 LAB — CBC WITH DIFFERENTIAL/PLATELET
Basophils Absolute: 0.1 10*3/uL (ref 0.0–0.1)
Eosinophils Absolute: 0.3 10*3/uL (ref 0.0–0.5)
HCT: 29 % — ABNORMAL LOW (ref 38.4–49.9)
HGB: 9.7 g/dL — ABNORMAL LOW (ref 13.0–17.1)
LYMPH%: 17.4 % (ref 14.0–49.0)
MCV: 93.2 fL (ref 79.3–98.0)
MONO#: 0.2 10*3/uL (ref 0.1–0.9)
MONO%: 9.4 % (ref 0.0–14.0)
NEUT#: 1.3 10*3/uL — ABNORMAL LOW (ref 1.5–6.5)
NEUT%: 57.6 % (ref 39.0–75.0)
Platelets: 85 10*3/uL — ABNORMAL LOW (ref 140–400)
WBC: 2.2 10*3/uL — ABNORMAL LOW (ref 4.0–10.3)

## 2012-10-19 LAB — COMPREHENSIVE METABOLIC PANEL (CC13)
ALT: 32 U/L (ref 0–55)
BUN: 33 mg/dL — ABNORMAL HIGH (ref 7.0–26.0)
CO2: 26 mEq/L (ref 22–29)
Creatinine: 2.4 mg/dL — ABNORMAL HIGH (ref 0.7–1.3)
Glucose: 183 mg/dl — ABNORMAL HIGH (ref 70–99)
Total Bilirubin: 0.3 mg/dL (ref 0.20–1.20)

## 2012-10-22 LAB — KAPPA/LAMBDA LIGHT CHAINS
Kappa free light chain: 26.8 mg/dL — ABNORMAL HIGH (ref 0.33–1.94)
Kappa:Lambda Ratio: 2.13 — ABNORMAL HIGH (ref 0.26–1.65)
Lambda Free Lght Chn: 12.6 mg/dL — ABNORMAL HIGH (ref 0.57–2.63)

## 2012-10-22 LAB — IGG, IGA, IGM: IgG (Immunoglobin G), Serum: 1470 mg/dL (ref 650–1600)

## 2012-10-23 NOTE — Telephone Encounter (Addendum)
RECEIVED A FAX FROM [facility] CONCERNING A CONFIRMATION OF PRESCRIPTION SHIPMENT FOR REVLIMID ON 10/22/12.

## 2012-10-25 LAB — UIFE/LIGHT CHAINS/TP QN, 24-HR UR
Albumin, U: DETECTED
Alpha 1, Urine: DETECTED — AB
Alpha 2, Urine: DETECTED — AB
Beta, Urine: DETECTED — AB
Free Kappa Lt Chains,Ur: 37.7 mg/dL — ABNORMAL HIGH (ref 0.14–2.42)
Free Lambda Excretion/Day: 163.8 mg/d
Gamma Globulin, Urine: DETECTED — AB
Time: 24 hours

## 2012-10-26 ENCOUNTER — Telehealth: Payer: Self-pay | Admitting: Oncology

## 2012-10-26 ENCOUNTER — Other Ambulatory Visit (HOSPITAL_BASED_OUTPATIENT_CLINIC_OR_DEPARTMENT_OTHER): Payer: Medicare Other

## 2012-10-26 ENCOUNTER — Ambulatory Visit (HOSPITAL_BASED_OUTPATIENT_CLINIC_OR_DEPARTMENT_OTHER): Payer: Medicare Other | Admitting: Oncology

## 2012-10-26 VITALS — BP 151/58 | HR 83 | Temp 98.2°F | Resp 20 | Ht 66.0 in | Wt 259.8 lb

## 2012-10-26 DIAGNOSIS — C9000 Multiple myeloma not having achieved remission: Secondary | ICD-10-CM

## 2012-10-26 DIAGNOSIS — C9002 Multiple myeloma in relapse: Secondary | ICD-10-CM

## 2012-10-26 DIAGNOSIS — N049 Nephrotic syndrome with unspecified morphologic changes: Secondary | ICD-10-CM

## 2012-10-26 DIAGNOSIS — M545 Low back pain: Secondary | ICD-10-CM

## 2012-10-26 LAB — CBC WITH DIFFERENTIAL/PLATELET
Basophils Absolute: 0.1 10*3/uL (ref 0.0–0.1)
Eosinophils Absolute: 0.3 10*3/uL (ref 0.0–0.5)
HCT: 30.5 % — ABNORMAL LOW (ref 38.4–49.9)
HGB: 10.2 g/dL — ABNORMAL LOW (ref 13.0–17.1)
LYMPH%: 16.7 % (ref 14.0–49.0)
MONO#: 0.3 10*3/uL (ref 0.1–0.9)
NEUT#: 1.6 10*3/uL (ref 1.5–6.5)
NEUT%: 58.7 % (ref 39.0–75.0)
Platelets: 86 10*3/uL — ABNORMAL LOW (ref 140–400)
WBC: 2.7 10*3/uL — ABNORMAL LOW (ref 4.0–10.3)

## 2012-10-26 NOTE — Progress Notes (Signed)
Hematology and Oncology Follow Up Visit  William Gaines [id number] 12-18-1940 71 y.o. 10/26/2012 6:11 PM   Principle Diagnosis: Encounter Diagnoses  Name Primary?  . Multiple myeloma without mention of remission Yes  . Multiple myeloma, in relapse   . Nephrotic syndrome      Interim History:   Short interval followup is for this 71 year old man with kappa light chain multiple myeloma. He has chronic nephrotic range proteinuria making evaluation of response to treatment of his myeloma  difficult. All things considered, he has done quite well given he was diagnosed 6 years ago. Please see multiple previous notes for full details of treatments to date. Most recently he completed a 6 month course of oral Alkeran and subcutaneous Velcade. In September he was put on low-dose Revlimid maintenance which he continues at this time. He is tolerating treatment well. He has moderate myelosuppression but no dose changes have been made. He is taking 10 mg daily.  There is no change in his chronic intermittent back discomfort. He has had no interim infections.  Kappa serum free light chains appear to be slowly rising from 5.76 in August to 26.8 as of December 13. Kappa/Lambda ratio now 2.13.  24-hour urine total protein has fluctuated widely but was down as low as 983 mg in July, also rising, up to 1466 in October and 2502 on December 13.  Medications: reviewed  Allergies:  Allergies  Allergen Reactions  . Codeine     hallucinations  . Lorazepam (Lorazepam) Anxiety    Paradoxical agitation when given as pre med for bone marrow biopsy 09/11/06    Review of Systems: Constitutional:   No constitutional symptoms Respiratory: No cough or dyspnea Cardiovascular:  No chest pain or palpitations Gastrointestinal: No abdominal pain or change in bowel habit Genito-Urinary: No urinary tract symptoms Musculoskeletal: Chronic low back discomfort Neurologic: No headache or change in vision Skin: No rash  or ecchymosis Remaining ROS negative.  Physical Exam: Blood pressure 151/58, pulse 83, temperature 98.2 F (36.8 C), temperature source Oral, resp. rate 20, height 5\' 6"  (1.676 m), weight 259 lb 12.8 oz (117.845 kg). Wt Readings from Last 3 Encounters:  10/26/12 259 lb 12.8 oz (117.845 kg)  09/21/12 262 lb (118.842 kg)  08/24/12 263 lb 12.8 oz (119.659 kg)     General appearance: Overweight Caucasian man HENNT: Pharynx no erythema or exudate Lymph nodes: No adenopathy Breasts: Lungs: Clear to auscultation resonant to percussion Heart: Regular rhythm no murmur Abdomen: Soft, obese, ventral hernia Extremities: Chronic edema 1-2+ Vascular: No cyanosis Neurologic: Motor strength 5 over 5. Slight decreased vibration sense by tuning fork exam over the fingers Skin: No rash or ecchymosis he  Lab Results: Lab Results  Component Value Date   WBC 2.7* 10/26/2012   HGB 10.2* 10/26/2012   HCT 30.5* 10/26/2012   MCV 92.2 10/26/2012   PLT 86* 10/26/2012     Chemistry      Component Value Date/Time   NA 142 10/19/2012 1335   NA 140 08/17/2012 1356   NA 137 07/07/2011 1217   K 3.6 10/19/2012 1335   K 3.7 08/17/2012 1356   K 4.8* 07/07/2011 1217   CL 108* 10/19/2012 1335   CL 103 08/17/2012 1356   CL 99 07/07/2011 1217   CO2 26 10/19/2012 1335   CO2 25 08/17/2012 1356   CO2 29 07/07/2011 1217   BUN 33.0* 10/19/2012 1335   BUN 34* 08/17/2012 1356   BUN 41* 07/07/2011 1217   CREATININE 2.4*  10/19/2012 1335   CREATININE 2.11* 08/17/2012 1356   CREATININE 2.1* 07/07/2011 1217   CREATININE 1.90* 03/25/2011 1008   CREATININE 1.90* 03/25/2011 1008   CREATININE 1.90* 03/25/2011 1008   CREATININE 1.90* 03/25/2011 1008      Component Value Date/Time   CALCIUM 8.1* 10/19/2012 1335   CALCIUM 8.4 08/17/2012 1356   CALCIUM 8.7 07/07/2011 1217   ALKPHOS 57 10/19/2012 1335   ALKPHOS 54 08/17/2012 1356   AST 18 10/19/2012 1335   AST 17 08/17/2012 1356   ALT 32 10/19/2012 1335   ALT 31  08/17/2012 1356   BILITOT 0.30 10/19/2012 1335   BILITOT 0.3 08/17/2012 1356      Impression and Plan: #1. Kappa light chain myeloma He appears to be slowly breaking through treatment again. He is clinically stable. I'm going to wait one more month to get another point on the curve.  If lab parameters continue to decline, I'm going to stop the Revlimid and put him on a trial of pomalidomide.  #2. Idiopathic nephrotic syndrome Can't exclude a component of myeloma kidney  #3. Objective airway disease  #4. Insulin-dependent diabetes  #5. Essential hypertension   CC:. Dr. Lyn Hollingshead; Dr. Kendrick Ranch, MD 12/20/20136:11 PM

## 2012-10-26 NOTE — Telephone Encounter (Signed)
Gave pt appt for lab q week and see ML on January 2014 with labs

## 2012-11-02 ENCOUNTER — Other Ambulatory Visit (HOSPITAL_BASED_OUTPATIENT_CLINIC_OR_DEPARTMENT_OTHER): Payer: Medicare Other

## 2012-11-02 DIAGNOSIS — M8448XA Pathological fracture, other site, initial encounter for fracture: Secondary | ICD-10-CM

## 2012-11-02 DIAGNOSIS — N049 Nephrotic syndrome with unspecified morphologic changes: Secondary | ICD-10-CM

## 2012-11-02 DIAGNOSIS — C9000 Multiple myeloma not having achieved remission: Secondary | ICD-10-CM

## 2012-11-02 DIAGNOSIS — M545 Low back pain: Secondary | ICD-10-CM

## 2012-11-02 LAB — CBC WITH DIFFERENTIAL/PLATELET
BASO%: 4.4 % — ABNORMAL HIGH (ref 0.0–2.0)
Basophils Absolute: 0.1 10*3/uL (ref 0.0–0.1)
EOS%: 9.9 % — ABNORMAL HIGH (ref 0.0–7.0)
HCT: 30.8 % — ABNORMAL LOW (ref 38.4–49.9)
HGB: 10.3 g/dL — ABNORMAL LOW (ref 13.0–17.1)
LYMPH%: 17 % (ref 14.0–49.0)
MCH: 30.9 pg (ref 27.2–33.4)
MCHC: 33.5 g/dL (ref 32.0–36.0)
NEUT%: 59.2 % (ref 39.0–75.0)
Platelets: 83 10*3/uL — ABNORMAL LOW (ref 140–400)

## 2012-11-09 ENCOUNTER — Other Ambulatory Visit (HOSPITAL_BASED_OUTPATIENT_CLINIC_OR_DEPARTMENT_OTHER): Payer: Medicare Other

## 2012-11-09 DIAGNOSIS — C9002 Multiple myeloma in relapse: Secondary | ICD-10-CM

## 2012-11-09 DIAGNOSIS — C9000 Multiple myeloma not having achieved remission: Secondary | ICD-10-CM

## 2012-11-09 DIAGNOSIS — N049 Nephrotic syndrome with unspecified morphologic changes: Secondary | ICD-10-CM

## 2012-11-09 LAB — CBC WITH DIFFERENTIAL/PLATELET
BASO%: 4.3 % — ABNORMAL HIGH (ref 0.0–2.0)
EOS%: 10.9 % — ABNORMAL HIGH (ref 0.0–7.0)
HCT: 31.3 % — ABNORMAL LOW (ref 38.4–49.9)
MCH: 31.3 pg (ref 27.2–33.4)
MCHC: 34 g/dL (ref 32.0–36.0)
MCV: 91.9 fL (ref 79.3–98.0)
MONO%: 9.5 % (ref 0.0–14.0)
NEUT%: 59.3 % (ref 39.0–75.0)
RDW: 18.2 % — ABNORMAL HIGH (ref 11.0–14.6)
lymph#: 0.4 10*3/uL — ABNORMAL LOW (ref 0.9–3.3)

## 2012-11-13 LAB — KAPPA/LAMBDA LIGHT CHAINS
Kappa free light chain: 30.4 mg/dL — ABNORMAL HIGH (ref 0.33–1.94)
Kappa:Lambda Ratio: 2.11 — ABNORMAL HIGH (ref 0.26–1.65)
Lambda Free Lght Chn: 14.4 mg/dL — ABNORMAL HIGH (ref 0.57–2.63)

## 2012-11-13 LAB — IMMUNOFIXATION ELECTROPHORESIS: IgA: 565 mg/dL — ABNORMAL HIGH (ref 68–379)

## 2012-11-14 ENCOUNTER — Other Ambulatory Visit: Payer: Self-pay | Admitting: *Deleted

## 2012-11-14 DIAGNOSIS — C9000 Multiple myeloma not having achieved remission: Secondary | ICD-10-CM

## 2012-11-14 NOTE — Telephone Encounter (Signed)
Refill request to [facility] nurse desk.

## 2012-11-16 ENCOUNTER — Other Ambulatory Visit: Payer: Self-pay | Admitting: *Deleted

## 2012-11-16 ENCOUNTER — Other Ambulatory Visit: Payer: Medicare Other

## 2012-11-16 ENCOUNTER — Other Ambulatory Visit (HOSPITAL_BASED_OUTPATIENT_CLINIC_OR_DEPARTMENT_OTHER): Payer: Medicare Other

## 2012-11-16 DIAGNOSIS — C9000 Multiple myeloma not having achieved remission: Secondary | ICD-10-CM

## 2012-11-16 LAB — CBC WITH DIFFERENTIAL/PLATELET
BASO%: 2.6 % — ABNORMAL HIGH (ref 0.0–2.0)
Basophils Absolute: 0.1 10*3/uL (ref 0.0–0.1)
EOS%: 11.2 % — ABNORMAL HIGH (ref 0.0–7.0)
HCT: 30.2 % — ABNORMAL LOW (ref 38.4–49.9)
HGB: 9.9 g/dL — ABNORMAL LOW (ref 13.0–17.1)
LYMPH%: 16.9 % (ref 14.0–49.0)
MCH: 29.7 pg (ref 27.2–33.4)
MCHC: 32.9 g/dL (ref 32.0–36.0)
MCV: 90.4 fL (ref 79.3–98.0)
MONO%: 10.2 % (ref 0.0–14.0)
NEUT%: 59.1 % (ref 39.0–75.0)
Platelets: 82 10*3/uL — ABNORMAL LOW (ref 140–400)

## 2012-11-20 LAB — UIFE/LIGHT CHAINS/TP QN, 24-HR UR
Alpha 1, Urine: DETECTED — AB
Free Kappa Lt Chains,Ur: 49.2 mg/dL — ABNORMAL HIGH (ref 0.14–2.42)
Free Kappa/Lambda Ratio: 9.04 ratio (ref 2.04–10.37)
Free Lambda Excretion/Day: 108.8 mg/d
Free Lambda Lt Chains,Ur: 5.44 mg/dL — ABNORMAL HIGH (ref 0.02–0.67)
Free Lt Chn Excr Rate: 984 mg/d
Gamma Globulin, Urine: DETECTED — AB
Time: 24 hours
Total Protein, Urine: 84.9 mg/dL
Volume, Urine: 2000 mL

## 2012-11-23 ENCOUNTER — Telehealth: Payer: Self-pay | Admitting: *Deleted

## 2012-11-23 ENCOUNTER — Other Ambulatory Visit: Payer: Medicare Other

## 2012-11-23 ENCOUNTER — Telehealth: Payer: Self-pay | Admitting: Oncology

## 2012-11-23 ENCOUNTER — Ambulatory Visit (HOSPITAL_BASED_OUTPATIENT_CLINIC_OR_DEPARTMENT_OTHER): Payer: Medicare Other | Admitting: Nurse Practitioner

## 2012-11-23 VITALS — BP 151/52 | HR 72 | Temp 97.1°F | Resp 20 | Ht 66.0 in | Wt 257.6 lb

## 2012-11-23 DIAGNOSIS — M545 Low back pain: Secondary | ICD-10-CM

## 2012-11-23 DIAGNOSIS — N049 Nephrotic syndrome with unspecified morphologic changes: Secondary | ICD-10-CM

## 2012-11-23 DIAGNOSIS — C9000 Multiple myeloma not having achieved remission: Secondary | ICD-10-CM

## 2012-11-23 NOTE — Telephone Encounter (Signed)
gv and pritned appt scheduel for Feb, March and April...emailed michelle to add tx...the patient awre

## 2012-11-23 NOTE — Telephone Encounter (Signed)
Per staff message and POF I have scheduled appts.  JMW  

## 2012-11-23 NOTE — Progress Notes (Signed)
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 At the time of his last visit with Dr. Cyndie Chime on 10/26/2012 there was concern that he was slowly breaking through treatment. Myeloma labs were repeated on 11/09/2012. Serum free kappa light chains were slightly more increased at 30.4 as compared to 26.8 on 10/22/2012 with a ratio of 2.11 as compared to a previous value of 2.13. 24-hour urine total protein returned at 1698 as compared to 2502 on 10/19/2012. Of note, he has chronic nephrotic range proteinuria which makes evaluation of response to treatment of his myeloma difficult.  He has no specific complaints but overall does not feel well. He is "tired and weak". He eats but does not have a "great appetite". No weight loss. He denies fevers. No change in chronic back pain. He has occasional nausea. No diarrhea.   Objective: Blood pressure 151/52, pulse 72, temperature 97.1 F (36.2 C), temperature source Oral, resp. rate 20, height 5\' 6"  (1.676 m), weight 257 lb 9.6 oz (116.847 kg).  Oropharynx is without thrush or ulceration. Lungs are clear. Regular cardiac rhythm. Abdomen is soft, obese. Ventral hernia. Chronic edema at the lower legs bilaterally approximate 1+. Chronic venous stasis changes. Motor strength 5 over 5.  Lab Results: Lab Results  Component Value Date   WBC 2.7* 11/16/2012   HGB 9.9* 11/16/2012   HCT 30.2* 11/16/2012   MCV 90.4 11/16/2012   PLT 82* 11/16/2012    Chemistry:    Chemistry      Component Value Date/Time   NA 142 10/19/2012 1335   NA 140 08/17/2012 1356   NA 137 07/07/2011 1217   K 3.6 10/19/2012 1335   K 3.7 08/17/2012 1356   K 4.8* 07/07/2011 1217   CL 108* 10/19/2012 1335   CL 103 08/17/2012 1356   CL 99 07/07/2011 1217   CO2 26 10/19/2012 1335   CO2 25 08/17/2012 1356   CO2 29 07/07/2011 1217   BUN 33.0* 10/19/2012 1335   BUN 34* 08/17/2012 1356   BUN  41* 07/07/2011 1217   CREATININE 2.4* 10/19/2012 1335   CREATININE 2.11* 08/17/2012 1356   CREATININE 2.1* 07/07/2011 1217   CREATININE 1.90* 03/25/2011 1008   CREATININE 1.90* 03/25/2011 1008   CREATININE 1.90* 03/25/2011 1008   CREATININE 1.90* 03/25/2011 1008      Component Value Date/Time   CALCIUM 8.1* 10/19/2012 1335   CALCIUM 8.4 08/17/2012 1356   CALCIUM 8.7 07/07/2011 1217   ALKPHOS 57 10/19/2012 1335   ALKPHOS 54 08/17/2012 1356   AST 18 10/19/2012 1335   AST 17 08/17/2012 1356   ALT 32 10/19/2012 1335   ALT 31 08/17/2012 1356   BILITOT 0.30 10/19/2012 1335   BILITOT 0.3 08/17/2012 1356       Studies/Results: No results found.  Medications: I have reviewed the patient's current medications.  Assessment/Plan:  1. Nonsecretory versus kappa light chain myeloma withiInitial diagnosis dating to October 2007 when he presented with severe back pain and left hip pain. He was found to have a destructive lesion at L4 with biopsy showing a plasma cell infiltrate. He received initial palliative radiation to the spine and underwent a surgical stabilization procedure. He was started on a chemotherapy/immunotherapy program with a combination of Revlimid and dexamethasone. He was unable to tolerate the steroids due to advanced insulin-dependent diabetes. He had a nice response to treatment. He was also started on a monthly Zometa  infusions. Initial treatment was continued through July 2009. He progressed in February 2012 with development of an asymptomatic lytic lesion in the left iliac bone and a rise in the urine protein level. He received radiation to the left hip in March 2012 and was started back on a chemotherapy program with oral melphalan and subcutaneous Velcade. Treatment was continued through August of this year with a good response. He began maintenance Revlimid 10 mg daily following an office visit on 07/16/2012. 2. Chronic idiopathic nephrotic syndrome likely due to diabetic  nephrotic syndrome a component of light chain myeloma. 3. Insulin-dependent diabetes. 4. Chronic renal insufficiency secondary to #2 and #3. 5. Peripheral neuropathy. 6. Obstructive airway disease.  Disposition-William Gaines appears stable. Most recent myeloma labs values are overall stable. Dr. Cyndie Chime recommends continuation of maintenance Revlimid for now with repeat labs at a three-month interval. We will continue to obtain monthly CBCs. He will return for a followup visit on 02/08/2013 with a Zometa infusion. He will contact the office in the interim with any problems.  Plan reviewed with Dr. Cyndie Chime.  Lonna Cobb ANP/GNP-BC

## 2012-11-27 MED ORDER — LENALIDOMIDE 10 MG PO CAPS: 10.0000 mg | ORAL_CAPSULE | Freq: Every day | ORAL | Status: DC

## 2012-11-27 NOTE — Addendum Note (Signed)
Addended by: Arvilla Meres on: 11/27/2012 11:50 AM   Modules accepted: Orders

## 2012-11-28 NOTE — Telephone Encounter (Signed)
Rec'd confirmation fax from [facility] that patients Revlimid was shipped on 11/27/12 for next day delivery.

## 2012-11-30 ENCOUNTER — Other Ambulatory Visit: Payer: Medicare Other

## 2012-12-07 ENCOUNTER — Other Ambulatory Visit: Payer: Medicare Other

## 2012-12-14 ENCOUNTER — Other Ambulatory Visit (HOSPITAL_BASED_OUTPATIENT_CLINIC_OR_DEPARTMENT_OTHER): Payer: Medicare Other

## 2012-12-14 DIAGNOSIS — C9000 Multiple myeloma not having achieved remission: Secondary | ICD-10-CM

## 2012-12-14 LAB — CBC WITH DIFFERENTIAL/PLATELET
Basophils Absolute: 0.1 10*3/uL (ref 0.0–0.1)
Eosinophils Absolute: 0.4 10*3/uL (ref 0.0–0.5)
HCT: 33.1 % — ABNORMAL LOW (ref 38.4–49.9)
HGB: 10.9 g/dL — ABNORMAL LOW (ref 13.0–17.1)
MONO#: 0.6 10*3/uL (ref 0.1–0.9)
NEUT#: 2.7 10*3/uL (ref 1.5–6.5)
NEUT%: 64.6 % (ref 39.0–75.0)
RDW: 16.6 % — ABNORMAL HIGH (ref 11.0–14.6)
WBC: 4.2 10*3/uL (ref 4.0–10.3)
lymph#: 0.5 10*3/uL — ABNORMAL LOW (ref 0.9–3.3)

## 2012-12-18 ENCOUNTER — Other Ambulatory Visit: Payer: Self-pay | Admitting: *Deleted

## 2012-12-18 NOTE — Telephone Encounter (Signed)
THIS REFILL REQUEST WAS PLACED IN DR.GRANFORTUNA'S ACTIVE WORK BOX.

## 2012-12-19 ENCOUNTER — Other Ambulatory Visit: Payer: Self-pay | Admitting: *Deleted

## 2012-12-19 DIAGNOSIS — C9000 Multiple myeloma not having achieved remission: Secondary | ICD-10-CM

## 2012-12-19 MED ORDER — LENALIDOMIDE 10 MG PO CAPS: 10.0000 mg | ORAL_CAPSULE | Freq: Every day | ORAL | Status: DC

## 2012-12-21 ENCOUNTER — Other Ambulatory Visit: Payer: Medicare Other

## 2012-12-25 NOTE — Telephone Encounter (Signed)
RECEIVED A FAX FROM [facility] CONCERNING A CONFIRMATION OF PRESCRIPTION SHIPMENT FOR REVLIMID ON 12/24/12.

## 2012-12-31 ENCOUNTER — Telehealth: Payer: Self-pay | Admitting: Internal Medicine

## 2012-12-31 NOTE — Telephone Encounter (Signed)
Faxed pt medical records to Dr. Keturah Shavers office

## 2013-01-11 ENCOUNTER — Other Ambulatory Visit: Payer: Medicare Other | Admitting: Lab

## 2013-01-15 ENCOUNTER — Telehealth: Payer: Self-pay | Admitting: Oncology

## 2013-01-15 ENCOUNTER — Other Ambulatory Visit: Payer: Self-pay | Admitting: *Deleted

## 2013-01-15 NOTE — Telephone Encounter (Signed)
r/s lab from 01/11/13 to 3/12 per pt rqst

## 2013-01-15 NOTE — Telephone Encounter (Signed)
THIS REFILL REQUEST FOR REVLIMID WAS PLACED IN DR.GRANFORTUNA'S ACTIVE WORK BOX. 

## 2013-01-16 ENCOUNTER — Other Ambulatory Visit (HOSPITAL_BASED_OUTPATIENT_CLINIC_OR_DEPARTMENT_OTHER): Payer: Medicare Other

## 2013-01-16 ENCOUNTER — Other Ambulatory Visit: Payer: Self-pay | Admitting: *Deleted

## 2013-01-16 DIAGNOSIS — C9000 Multiple myeloma not having achieved remission: Secondary | ICD-10-CM

## 2013-01-16 LAB — CBC WITH DIFFERENTIAL/PLATELET
Basophils Absolute: 0.1 10*3/uL (ref 0.0–0.1)
EOS%: 13 % — ABNORMAL HIGH (ref 0.0–7.0)
Eosinophils Absolute: 0.4 10*3/uL (ref 0.0–0.5)
HCT: 29.9 % — ABNORMAL LOW (ref 38.4–49.9)
HGB: 10 g/dL — ABNORMAL LOW (ref 13.0–17.1)
MCH: 29.7 pg (ref 27.2–33.4)
MCV: 89.1 fL (ref 79.3–98.0)
MONO%: 10.2 % (ref 0.0–14.0)
NEUT#: 1.9 10*3/uL (ref 1.5–6.5)
NEUT%: 60.1 % (ref 39.0–75.0)
RDW: 19.2 % — ABNORMAL HIGH (ref 11.0–14.6)

## 2013-01-16 MED ORDER — LENALIDOMIDE 10 MG PO CAPS: 10.0000 mg | ORAL_CAPSULE | Freq: Every day | ORAL | Status: DC

## 2013-01-21 NOTE — Telephone Encounter (Signed)
RECEIVED A FAX FROM [facility] CONCERNING A CONFIRMATION OF PRESCRIPTION SHIPMENT FOR REVLIMID ON 01/18/13.

## 2013-01-23 ENCOUNTER — Other Ambulatory Visit: Payer: Self-pay | Admitting: Oncology

## 2013-02-01 ENCOUNTER — Other Ambulatory Visit (HOSPITAL_BASED_OUTPATIENT_CLINIC_OR_DEPARTMENT_OTHER): Payer: Medicare Other | Admitting: Lab

## 2013-02-01 DIAGNOSIS — C9000 Multiple myeloma not having achieved remission: Secondary | ICD-10-CM

## 2013-02-01 LAB — COMPREHENSIVE METABOLIC PANEL (CC13)
AST: 19 U/L (ref 5–34)
Alkaline Phosphatase: 56 U/L (ref 40–150)
BUN: 46.2 mg/dL — ABNORMAL HIGH (ref 7.0–26.0)
Calcium: 8.1 mg/dL — ABNORMAL LOW (ref 8.4–10.4)
Chloride: 105 mEq/L (ref 98–107)
Creatinine: 3 mg/dL (ref 0.7–1.3)
Total Bilirubin: 0.46 mg/dL (ref 0.20–1.20)

## 2013-02-01 LAB — CBC WITH DIFFERENTIAL/PLATELET
Basophils Absolute: 0 10*3/uL (ref 0.0–0.1)
Eosinophils Absolute: 0.3 10*3/uL (ref 0.0–0.5)
HGB: 10.8 g/dL — ABNORMAL LOW (ref 13.0–17.1)
MONO#: 0.3 10*3/uL (ref 0.1–0.9)
NEUT#: 2.2 10*3/uL (ref 1.5–6.5)
RBC: 3.66 10*6/uL — ABNORMAL LOW (ref 4.20–5.82)
RDW: 19.3 % — ABNORMAL HIGH (ref 11.0–14.6)
WBC: 3.2 10*3/uL — ABNORMAL LOW (ref 4.0–10.3)
lymph#: 0.3 10*3/uL — ABNORMAL LOW (ref 0.9–3.3)

## 2013-02-04 LAB — KAPPA/LAMBDA LIGHT CHAINS
Kappa free light chain: 14.6 mg/dL — ABNORMAL HIGH (ref 0.33–1.94)
Kappa:Lambda Ratio: 1.1 (ref 0.26–1.65)
Lambda Free Lght Chn: 13.3 mg/dL — ABNORMAL HIGH (ref 0.57–2.63)

## 2013-02-04 LAB — IGG, IGA, IGM
IgA: 604 mg/dL — ABNORMAL HIGH (ref 68–379)
IgG (Immunoglobin G), Serum: 1660 mg/dL — ABNORMAL HIGH (ref 650–1600)
IgM, Serum: 25 mg/dL — ABNORMAL LOW (ref 41–251)

## 2013-02-05 LAB — UIFE/LIGHT CHAINS/TP QN, 24-HR UR
Albumin, U: DETECTED
Alpha 1, Urine: DETECTED — AB
Alpha 2, Urine: DETECTED — AB
Free Kappa Lt Chains,Ur: 37.4 mg/dL — ABNORMAL HIGH (ref 0.14–2.42)
Free Kappa/Lambda Ratio: 13.36 ratio — ABNORMAL HIGH (ref 2.04–10.37)
Free Lambda Excretion/Day: 58.8 mg/d
Gamma Globulin, Urine: DETECTED — AB
Time: 24 hours
Total Protein, Urine: 51.2 mg/dL

## 2013-02-08 ENCOUNTER — Ambulatory Visit: Payer: Medicare Other

## 2013-02-08 ENCOUNTER — Telehealth: Payer: Self-pay | Admitting: Oncology

## 2013-02-08 ENCOUNTER — Other Ambulatory Visit (HOSPITAL_BASED_OUTPATIENT_CLINIC_OR_DEPARTMENT_OTHER): Payer: Medicare Other | Admitting: Lab

## 2013-02-08 ENCOUNTER — Ambulatory Visit (HOSPITAL_BASED_OUTPATIENT_CLINIC_OR_DEPARTMENT_OTHER): Payer: Medicare Other | Admitting: Oncology

## 2013-02-08 ENCOUNTER — Telehealth: Payer: Self-pay | Admitting: *Deleted

## 2013-02-08 VITALS — BP 125/48 | HR 56 | Temp 98.8°F | Resp 22 | Ht 66.0 in | Wt 254.6 lb

## 2013-02-08 DIAGNOSIS — N184 Chronic kidney disease, stage 4 (severe): Secondary | ICD-10-CM

## 2013-02-08 DIAGNOSIS — C9002 Multiple myeloma in relapse: Secondary | ICD-10-CM

## 2013-02-08 DIAGNOSIS — E119 Type 2 diabetes mellitus without complications: Secondary | ICD-10-CM

## 2013-02-08 DIAGNOSIS — C9000 Multiple myeloma not having achieved remission: Secondary | ICD-10-CM

## 2013-02-08 DIAGNOSIS — N049 Nephrotic syndrome with unspecified morphologic changes: Secondary | ICD-10-CM

## 2013-02-08 DIAGNOSIS — N2889 Other specified disorders of kidney and ureter: Secondary | ICD-10-CM | POA: Insufficient documentation

## 2013-02-08 LAB — BASIC METABOLIC PANEL (CC13)
BUN: 37.4 mg/dL — ABNORMAL HIGH (ref 7.0–26.0)
CO2: 24 mEq/L (ref 22–29)
Calcium: 8.6 mg/dL (ref 8.4–10.4)
Creatinine: 3 mg/dL (ref 0.7–1.3)
Glucose: 175 mg/dl — ABNORMAL HIGH (ref 70–99)
Sodium: 139 mEq/L (ref 136–145)

## 2013-02-08 NOTE — Telephone Encounter (Signed)
Per staff phone call and POF I have schedueld appts.  JMW  

## 2013-02-08 NOTE — Telephone Encounter (Signed)
Gave pt appt for labs,MD,Zometa for April and May 2014

## 2013-02-08 NOTE — Patient Instructions (Addendum)
Decrease revlimid to 10 mg every other day

## 2013-02-10 NOTE — Progress Notes (Signed)
Hematology and Oncology Follow Up Visit  William Gaines [id number] Sep 16, 1941 72 y.o. 02/10/2013 11:49 AM   Principle Diagnosis: Encounter Diagnoses  Name Primary?  . Nephrotic syndrome   . Multiple myeloma, in relapse Yes  . Chronic renal insufficiency, stage IV (severe)      Interim History:   Short interim followup visit for this 72 year old man with non-secretory versus kappa light chain multiple myeloma initially diagnosed in October 2007 when he presented with severe back pain due to pathologic fractures of his spine. Initial treatment with palliative radiation. Subsequent surgery  stabilization procedure. He went on to receive  immunotherapy initially with Revlimid plus dexamethasone for 6 months. Subsequent discontinuation of dexamethasone due to  uncontrolled diabetes and infection. He was followed until a new lytic lesion of his left iliac bone developed in March 2012. He received a course of palliative radiation. He was started back on chemotherapy with a combination of subcutaneous Velcade plus oral melphalan in August 2012. He achieved a nice partial response. Most recently he was put back on single agent Revlimid maintenance 10 mg daily beginning in August 2013. He has tolerated this well. Most recent reevaluation on 02/01/2013 shows kappa serum free light chains 14.6 mg percent compared with 30.4 on 11/09/2012. 24-hour urine with fall and 24-hour total protein from 2.5 g on 10/23/2012 to 1075 mg on 02/01/2013. He developed an  acute gastroenteritis last week characterized by explosive diarrhea. Routine lab done through this office showed a rise in creatinine from his baseline of 2.4 recorded in December to 3.0. He continued to take his diuretic despite his diarrhea. Repeat creatinine today remains at 3.0 BUN down to 37 from 40 6R March 28. His diarrhea has resolved.  He tells me that his wife who is also my patient for non-small cell lung cancer just had a stroke and is currently  undergoing rehabilitation.   Medications: reviewed  Allergies:  Allergies  Allergen Reactions  . Codeine     hallucinations  . Lorazepam (Lorazepam) Anxiety    Paradoxical agitation when given as pre med for bone marrow biopsy 09/11/06    Review of Systems: Constitutional:   Chronic fatigue. Good appetite Respiratory: No cough or dyspnea Cardiovascular:  No chest pain or palpitations Gastrointestinal: See above Genito-Urinary: No urinary tract symptoms Musculoskeletal: Stable low back pain no new areas of pain Neurologic: No headache or change in vision Skin: No rash or ecchymosis Remaining ROS negative.  Physical Exam: Blood pressure 125/48, pulse 56, temperature 98.8 F (37.1 C), temperature source Oral, resp. rate 22, height 5\' 6"  (1.676 m), weight 254 lb 9.6 oz (115.486 kg). Wt Readings from Last 3 Encounters:  02/08/13 254 lb 9.6 oz (115.486 kg)  11/23/12 257 lb 9.6 oz (116.847 kg)  10/26/12 259 lb 12.8 oz (117.845 kg)     General appearance: An obese Caucasian man HENNT: Pharynx no erythema exudate or ulcer Lymph nodes: No adenopathy Breasts: Lungs: Clear to auscultation resonant to percussion Heart: Regular rhythm no murmur Abdomen: Soft, obese, nontender, large ventral hernia, Extremities: 1+ edema no calf tenderness Vascular: No cyanosis Neurologic: Motor strength 5 over 5, reflexes absent symmetric at the knees, 1+ symmetric at the biceps Skin: No rash or ecchymosis  Lab Results: Lab Results  Component Value Date   WBC 3.2* 02/01/2013   HGB 10.8* 02/01/2013   HCT 32.8* 02/01/2013   MCV 89.6 02/01/2013   PLT 89* 02/01/2013     Chemistry      Component Value Date/Time  NA 139 02/08/2013 1410   NA 140 08/17/2012 1356   NA 137 07/07/2011 1217   K 3.8 02/08/2013 1410   K 3.7 08/17/2012 1356   K 4.8* 07/07/2011 1217   CL 104 02/08/2013 1410   CL 103 08/17/2012 1356   CL 99 07/07/2011 1217   CO2 24 02/08/2013 1410   CO2 25 08/17/2012 1356   CO2 29 07/07/2011  1217   BUN 37.4* 02/08/2013 1410   BUN 34* 08/17/2012 1356   BUN 41* 07/07/2011 1217   CREATININE 3.0 Repeated and Verified* 02/08/2013 1410   CREATININE 2.11* 08/17/2012 1356   CREATININE 2.1* 07/07/2011 1217   CREATININE 1.90* 03/25/2011 1008      Component Value Date/Time   CALCIUM 8.6 02/08/2013 1410   CALCIUM 8.4 08/17/2012 1356   CALCIUM 8.7 07/07/2011 1217   ALKPHOS 56 02/01/2013 1339   ALKPHOS 54 08/17/2012 1356   AST 19 02/01/2013 1339   AST 17 08/17/2012 1356   ALT 27 02/01/2013 1339   ALT 31 08/17/2012 1356   BILITOT 0.46 02/01/2013 1339   BILITOT 0.3 08/17/2012 1356      Impression and Plan:  #1. Kappa light chain myeloma  Stable on single agent Revlimid. In review of the decline in his renal function I am going to change the Revlimid to 5 mg daily. I told him to take his 10 mg tablets every other day until the current prescription expires.  #2. Idiopathic nephrotic syndrome Can't exclude a component of myeloma kidney. Kidney function is now worse despite fall in urine protein and serum kappa free light chains with preserved normal serum kappa to lambda ratio so I think this is just a progression of his medical renal disease. There may be an element of transient dehydration due to recent gastroenteritis. We will continue to monitor his renal function closely.  #3. Objective airway disease   #4. Insulin-dependent diabetes   #5. Essential hypertension   CC:.    Levert Feinstein, MD 4/6/201411:49 AM

## 2013-02-12 ENCOUNTER — Ambulatory Visit: Payer: Medicare Other

## 2013-02-15 ENCOUNTER — Other Ambulatory Visit: Payer: Self-pay | Admitting: *Deleted

## 2013-02-15 DIAGNOSIS — C9000 Multiple myeloma not having achieved remission: Secondary | ICD-10-CM

## 2013-02-15 MED ORDER — LENALIDOMIDE 5 MG PO CAPS: 5.0000 mg | ORAL_CAPSULE | Freq: Every day | ORAL | Status: DC

## 2013-02-15 NOTE — Telephone Encounter (Signed)
THIS REFILL REQUEST FOR REVLIMID WAS PLACED IN DR.GRANFORTUNA'S ACTIVE WORK BOX. 

## 2013-02-22 ENCOUNTER — Ambulatory Visit: Payer: Medicare Other

## 2013-02-22 ENCOUNTER — Other Ambulatory Visit (HOSPITAL_BASED_OUTPATIENT_CLINIC_OR_DEPARTMENT_OTHER): Payer: Medicare Other | Admitting: Lab

## 2013-02-22 DIAGNOSIS — N184 Chronic kidney disease, stage 4 (severe): Secondary | ICD-10-CM

## 2013-02-22 DIAGNOSIS — N049 Nephrotic syndrome with unspecified morphologic changes: Secondary | ICD-10-CM

## 2013-02-22 DIAGNOSIS — C9000 Multiple myeloma not having achieved remission: Secondary | ICD-10-CM

## 2013-02-22 DIAGNOSIS — C9002 Multiple myeloma in relapse: Secondary | ICD-10-CM

## 2013-02-22 LAB — CBC WITH DIFFERENTIAL/PLATELET
BASO%: 4 % — ABNORMAL HIGH (ref 0.0–2.0)
EOS%: 9.1 % — ABNORMAL HIGH (ref 0.0–7.0)
HCT: 31.3 % — ABNORMAL LOW (ref 38.4–49.9)
MCH: 28.2 pg (ref 27.2–33.4)
MCHC: 32.6 g/dL (ref 32.0–36.0)
NEUT%: 60.5 % (ref 39.0–75.0)
RBC: 3.62 10*6/uL — ABNORMAL LOW (ref 4.20–5.82)
lymph#: 0.5 10*3/uL — ABNORMAL LOW (ref 0.9–3.3)

## 2013-02-22 LAB — COMPREHENSIVE METABOLIC PANEL (CC13)
ALT: 22 U/L (ref 0–55)
AST: 20 U/L (ref 5–34)
Calcium: 8.4 mg/dL (ref 8.4–10.4)
Chloride: 104 mEq/L (ref 98–107)
Creatinine: 2.8 mg/dL — ABNORMAL HIGH (ref 0.7–1.3)
Sodium: 138 mEq/L (ref 136–145)
Total Bilirubin: 0.3 mg/dL (ref 0.20–1.20)
Total Protein: 7.6 g/dL (ref 6.4–8.3)

## 2013-02-22 MED ORDER — ZOLEDRONIC ACID 4 MG/5ML IV CONC: 4.0000 mg | Freq: Once | INTRAVENOUS | Status: DC

## 2013-02-22 MED ORDER — SODIUM CHLORIDE 0.9 % IV SOLN: Freq: Once | INTRAVENOUS | Status: DC

## 2013-02-22 NOTE — Progress Notes (Signed)
Per Gearldine Bienenstock, PharmD- Zometa to be held today due to Cr. 2.8.

## 2013-02-25 ENCOUNTER — Encounter: Payer: Self-pay | Admitting: Pharmacist

## 2013-03-08 ENCOUNTER — Other Ambulatory Visit (HOSPITAL_BASED_OUTPATIENT_CLINIC_OR_DEPARTMENT_OTHER): Payer: Medicare Other

## 2013-03-08 DIAGNOSIS — C9000 Multiple myeloma not having achieved remission: Secondary | ICD-10-CM

## 2013-03-08 DIAGNOSIS — C9002 Multiple myeloma in relapse: Secondary | ICD-10-CM

## 2013-03-08 DIAGNOSIS — N184 Chronic kidney disease, stage 4 (severe): Secondary | ICD-10-CM

## 2013-03-08 DIAGNOSIS — N049 Nephrotic syndrome with unspecified morphologic changes: Secondary | ICD-10-CM

## 2013-03-08 LAB — CBC WITH DIFFERENTIAL/PLATELET
Eosinophils Absolute: 0.3 10*3/uL (ref 0.0–0.5)
MONO#: 0.4 10*3/uL (ref 0.1–0.9)
NEUT#: 2 10*3/uL (ref 1.5–6.5)
RBC: 3.56 10*6/uL — ABNORMAL LOW (ref 4.20–5.82)
RDW: 19.8 % — ABNORMAL HIGH (ref 11.0–14.6)
WBC: 3.2 10*3/uL — ABNORMAL LOW (ref 4.0–10.3)
lymph#: 0.5 10*3/uL — ABNORMAL LOW (ref 0.9–3.3)

## 2013-03-11 LAB — KAPPA/LAMBDA LIGHT CHAINS
Kappa free light chain: 31.8 mg/dL — ABNORMAL HIGH (ref 0.33–1.94)
Lambda Free Lght Chn: 17 mg/dL — ABNORMAL HIGH (ref 0.57–2.63)

## 2013-03-21 ENCOUNTER — Telehealth: Payer: Self-pay | Admitting: *Deleted

## 2013-03-21 NOTE — Telephone Encounter (Signed)
Called patient and let him know that we received faxed lab work done 03/20/13.   So he will not need a CBC, or CMET here, BUT he will need his multiple myeloma labs.  He appreciated the call.  Called Kim in lab and she will leave note for phlebotomist for 5/16.   Copy of labs to Dr. Cyndie Chime.

## 2013-03-22 ENCOUNTER — Other Ambulatory Visit: Payer: Medicare Other

## 2013-03-22 DIAGNOSIS — N049 Nephrotic syndrome with unspecified morphologic changes: Secondary | ICD-10-CM

## 2013-03-22 DIAGNOSIS — N184 Chronic kidney disease, stage 4 (severe): Secondary | ICD-10-CM

## 2013-03-22 DIAGNOSIS — C9002 Multiple myeloma in relapse: Secondary | ICD-10-CM

## 2013-03-29 LAB — UIFE/LIGHT CHAINS/TP QN, 24-HR UR
Beta, Urine: DETECTED — AB
Free Kappa Lt Chains,Ur: 44.1 mg/dL — ABNORMAL HIGH (ref 0.14–2.42)
Free Lambda Excretion/Day: 140.28 mg/d
Free Lambda Lt Chains,Ur: 5.01 mg/dL — ABNORMAL HIGH (ref 0.02–0.67)
Gamma Globulin, Urine: DETECTED — AB
Time: 24 hours
Volume, Urine: 2800 mL

## 2013-04-02 ENCOUNTER — Other Ambulatory Visit: Payer: Self-pay | Admitting: *Deleted

## 2013-04-02 DIAGNOSIS — C9000 Multiple myeloma not having achieved remission: Secondary | ICD-10-CM

## 2013-04-02 MED ORDER — LENALIDOMIDE 5 MG PO CAPS: 5.0000 mg | ORAL_CAPSULE | Freq: Every day | ORAL | Status: DC

## 2013-04-02 NOTE — Telephone Encounter (Signed)
Refill request to Dr. Kalman Drape desk since Dr. Cyndie Chime out of office.

## 2013-04-02 NOTE — Telephone Encounter (Signed)
[facility] faxed confirmation of REVLIMID referral.  Will verify insurance and make delivery arrangements.

## 2013-04-05 ENCOUNTER — Other Ambulatory Visit (HOSPITAL_BASED_OUTPATIENT_CLINIC_OR_DEPARTMENT_OTHER): Payer: Medicare Other | Admitting: Lab

## 2013-04-05 ENCOUNTER — Ambulatory Visit (HOSPITAL_BASED_OUTPATIENT_CLINIC_OR_DEPARTMENT_OTHER): Payer: Medicare Other | Admitting: Nurse Practitioner

## 2013-04-05 ENCOUNTER — Telehealth: Payer: Self-pay | Admitting: Oncology

## 2013-04-05 VITALS — BP 142/58 | HR 70 | Temp 97.0°F | Resp 20 | Ht 66.0 in | Wt 252.6 lb

## 2013-04-05 DIAGNOSIS — N049 Nephrotic syndrome with unspecified morphologic changes: Secondary | ICD-10-CM

## 2013-04-05 DIAGNOSIS — C9002 Multiple myeloma in relapse: Secondary | ICD-10-CM

## 2013-04-05 DIAGNOSIS — C9000 Multiple myeloma not having achieved remission: Secondary | ICD-10-CM

## 2013-04-05 DIAGNOSIS — N184 Chronic kidney disease, stage 4 (severe): Secondary | ICD-10-CM

## 2013-04-05 DIAGNOSIS — I10 Essential (primary) hypertension: Secondary | ICD-10-CM

## 2013-04-05 DIAGNOSIS — J988 Other specified respiratory disorders: Secondary | ICD-10-CM

## 2013-04-05 LAB — BASIC METABOLIC PANEL (CC13)
BUN: 36.9 mg/dL — ABNORMAL HIGH (ref 7.0–26.0)
CO2: 25 mEq/L (ref 22–29)
Calcium: 8.3 mg/dL — ABNORMAL LOW (ref 8.4–10.4)
Creatinine: 2.8 mg/dL — ABNORMAL HIGH (ref 0.7–1.3)
Glucose: 236 mg/dl — ABNORMAL HIGH (ref 70–99)
Sodium: 139 mEq/L (ref 136–145)

## 2013-04-05 LAB — CBC WITH DIFFERENTIAL/PLATELET
Eosinophils Absolute: 0.2 10*3/uL (ref 0.0–0.5)
HCT: 31.9 % — ABNORMAL LOW (ref 38.4–49.9)
LYMPH%: 11.1 % — ABNORMAL LOW (ref 14.0–49.0)
MCHC: 33.3 g/dL (ref 32.0–36.0)
MCV: 87.9 fL (ref 79.3–98.0)
MONO%: 11.1 % (ref 0.0–14.0)
NEUT#: 2.7 10*3/uL (ref 1.5–6.5)
NEUT%: 69.1 % (ref 39.0–75.0)
Platelets: 103 10*3/uL — ABNORMAL LOW (ref 140–400)
RBC: 3.64 10*6/uL — ABNORMAL LOW (ref 4.20–5.82)

## 2013-04-05 NOTE — Telephone Encounter (Signed)
gv and printed appt sched adn avs for pt °

## 2013-04-05 NOTE — Progress Notes (Signed)
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 Initial diagnosis dates to October 2007 when he presented with severe back pain due to pathologic fractures of the spine. He was initially treated with palliative radiation. He subsequently underwent surgery. This was followed by Revlimid plus dexamethasone for 6 months. Dexamethasone was discontinued due to uncontrolled diabetes and infection. He was found to have a new lytic lesion of the left iliac bone in March 2012. He received a course of palliative radiation. He was started back on chemotherapy with subcutaneous Velcade plus oral melphalan August 2012 with a partial response. Most recently he was put back on treatment with single agent Revlimid maintenance 10 mg daily August 2013. The Revlimid dose was decreased to 5 mg daily following an office visit 02/08/2013 due to a decline in renal function.  Serum light chain analysis on 03/08/2013 showed kappa free light chains 31 mg, ratio 1.87 as compared to 14.6 and 1.10 on 02/01/2013 and 26.8 and 2.13 on 10/19/2012. 24-hour urine on 03/27/2013 showed total protein 2019 mg as compared to 1075 mg on 02/01/2013, 1698 mg on 11/16/2012 and 2502 mg on 10/23/2012.  He continues Revlimid 5 mg daily. He denies nausea, vomiting and diarrhea. No skin rash. Stable neuropathy symptoms in the hands and feet which predated Revlimid. Chronic back pain is unchanged. He has a good appetite. Energy level is poor.  Objective: Blood pressure 142/58, pulse 70, temperature 97 F (36.1 C), temperature source Oral, resp. rate 20, height 5\' 6"  (1.676 m), weight 252 lb 9.6 oz (114.579 kg).  Oropharynx is without thrush or ulceration. Lungs are clear. Regular cardiac rhythm. Abdomen is soft, obese. Nontender. Large ventral hernia. Trace lower leg edema bilaterally. Chronic stasis changes bilaterally. Vibratory  sense mildly decreased over the fingertips per tuning fork exam.  Lab Results: Lab Results  Component Value Date   WBC 3.9* 04/05/2013   HGB 10.6* 04/05/2013   HCT 31.9* 04/05/2013   MCV 87.9 04/05/2013   PLT 103* 04/05/2013    Chemistry:    Chemistry      Component Value Date/Time   NA 139 04/05/2013 1332   NA 140 08/17/2012 1356   NA 137 07/07/2011 1217   K 4.2 04/05/2013 1332   K 3.7 08/17/2012 1356   K 4.8* 07/07/2011 1217   CL 106 04/05/2013 1332   CL 103 08/17/2012 1356   CL 99 07/07/2011 1217   CO2 25 04/05/2013 1332   CO2 25 08/17/2012 1356   CO2 29 07/07/2011 1217   BUN 36.9* 04/05/2013 1332   BUN 34* 08/17/2012 1356   BUN 41* 07/07/2011 1217   CREATININE 2.8* 04/05/2013 1332   CREATININE 2.11* 08/17/2012 1356   CREATININE 2.1* 07/07/2011 1217   CREATININE 1.90* 03/25/2011 1008      Component Value Date/Time   CALCIUM 8.3* 04/05/2013 1332   CALCIUM 8.4 08/17/2012 1356   CALCIUM 8.7 07/07/2011 1217   ALKPHOS 69 02/22/2013 1350   ALKPHOS 54 08/17/2012 1356   AST 20 02/22/2013 1350   AST 17 08/17/2012 1356   ALT 22 02/22/2013 1350   ALT 31 08/17/2012 1356   BILITOT 0.30 02/22/2013 1350   BILITOT 0.3 08/17/2012 1356       Studies/Results: No results found.  Medications: I have reviewed the patient's current medications.  Assessment/Plan:  1. Kappa light chain myeloma currently on Revlimid 5 mg daily. 2. Idiopathic nephrotic syndrome.  3. Obstructive airway disease. 4. Insulin-dependent diabetes. 5. Essential hypertension.  Disposition-Mr. Fini appears stable. He will continue Revlimid 5 mg daily. We are checking CBCs on a 2 week schedule. He will return for a followup visit in 8 weeks.  Lonna Cobb ANP/GNP-BC

## 2013-04-05 NOTE — Telephone Encounter (Signed)
gv and printed appt sched and avs for pt  °

## 2013-04-09 NOTE — Telephone Encounter (Signed)
[facility] sent facsimile confirmation of Revlimid prescription shipment.  Was shipped 04/05/2013 with next business day delivery.    

## 2013-04-16 ENCOUNTER — Telehealth: Payer: Self-pay | Admitting: Oncology

## 2013-04-16 NOTE — Telephone Encounter (Signed)
Faxed pt medical records to [facility]

## 2013-04-19 ENCOUNTER — Telehealth: Payer: Self-pay | Admitting: *Deleted

## 2013-04-19 ENCOUNTER — Other Ambulatory Visit (HOSPITAL_BASED_OUTPATIENT_CLINIC_OR_DEPARTMENT_OTHER): Payer: Medicare Other

## 2013-04-19 DIAGNOSIS — N049 Nephrotic syndrome with unspecified morphologic changes: Secondary | ICD-10-CM

## 2013-04-19 DIAGNOSIS — N184 Chronic kidney disease, stage 4 (severe): Secondary | ICD-10-CM

## 2013-04-19 DIAGNOSIS — C9000 Multiple myeloma not having achieved remission: Secondary | ICD-10-CM

## 2013-04-19 DIAGNOSIS — N189 Chronic kidney disease, unspecified: Secondary | ICD-10-CM

## 2013-04-19 DIAGNOSIS — C9002 Multiple myeloma in relapse: Secondary | ICD-10-CM

## 2013-04-19 LAB — CBC WITH DIFFERENTIAL/PLATELET
Basophils Absolute: 0 10*3/uL (ref 0.0–0.1)
Eosinophils Absolute: 0.3 10*3/uL (ref 0.0–0.5)
HCT: 33.2 % — ABNORMAL LOW (ref 38.4–49.9)
HGB: 11.1 g/dL — ABNORMAL LOW (ref 13.0–17.1)
LYMPH%: 13.5 % — ABNORMAL LOW (ref 14.0–49.0)
MCV: 86 fL (ref 79.3–98.0)
MONO%: 9.6 % (ref 0.0–14.0)
NEUT#: 3 10*3/uL (ref 1.5–6.5)
Platelets: 135 10*3/uL — ABNORMAL LOW (ref 140–400)

## 2013-04-19 LAB — BASIC METABOLIC PANEL (CC13)
Chloride: 104 mEq/L (ref 98–107)
Potassium: 3.9 mEq/L (ref 3.5–5.1)

## 2013-04-19 NOTE — Telephone Encounter (Signed)
Notified pt to hold diuretic-lasix until further instructions from nephrologist due to elevated creat per Dr Cyndie Chime.

## 2013-04-22 LAB — KAPPA/LAMBDA LIGHT CHAINS
Kappa:Lambda Ratio: 1.83 — ABNORMAL HIGH (ref 0.26–1.65)
Lambda Free Lght Chn: 14.9 mg/dL — ABNORMAL HIGH (ref 0.57–2.63)

## 2013-04-30 ENCOUNTER — Other Ambulatory Visit: Payer: Self-pay | Admitting: *Deleted

## 2013-04-30 ENCOUNTER — Telehealth: Payer: Self-pay | Admitting: *Deleted

## 2013-04-30 DIAGNOSIS — C9000 Multiple myeloma not having achieved remission: Secondary | ICD-10-CM

## 2013-04-30 MED ORDER — LENALIDOMIDE 5 MG PO CAPS: 5.0000 mg | ORAL_CAPSULE | Freq: Every day | ORAL | Status: DC

## 2013-04-30 NOTE — Telephone Encounter (Signed)
Called pt & informed that script sent to [facility] for revlimid & reminders given & requested he call & do his survey.  He reports that we should be getting some labs from Dr. Louis Meckel & his creat was 2.14 & she added lasix back to every other day.

## 2013-04-30 NOTE — Telephone Encounter (Signed)
[facility] faxed revlimid refill request.  Request to provider for review.

## 2013-05-03 ENCOUNTER — Other Ambulatory Visit (HOSPITAL_BASED_OUTPATIENT_CLINIC_OR_DEPARTMENT_OTHER): Payer: Medicare Other

## 2013-05-03 DIAGNOSIS — C9000 Multiple myeloma not having achieved remission: Secondary | ICD-10-CM

## 2013-05-03 LAB — CBC WITH DIFFERENTIAL/PLATELET
Basophils Absolute: 0.1 10*3/uL (ref 0.0–0.1)
EOS%: 9.6 % — ABNORMAL HIGH (ref 0.0–7.0)
Eosinophils Absolute: 0.3 10*3/uL (ref 0.0–0.5)
HGB: 10.1 g/dL — ABNORMAL LOW (ref 13.0–17.1)
MCH: 28.1 pg (ref 27.2–33.4)
NEUT#: 2 10*3/uL (ref 1.5–6.5)
RBC: 3.6 10*6/uL — ABNORMAL LOW (ref 4.20–5.82)
RDW: 17.3 % — ABNORMAL HIGH (ref 11.0–14.6)
lymph#: 0.5 10*3/uL — ABNORMAL LOW (ref 0.9–3.3)
nRBC: 0 % (ref 0–0)

## 2013-05-03 LAB — TECHNOLOGIST REVIEW

## 2013-05-03 NOTE — Telephone Encounter (Signed)
[facility] confirmation that Revlimid was shipped on 6/26 for next day delivery.

## 2013-05-17 ENCOUNTER — Other Ambulatory Visit (HOSPITAL_BASED_OUTPATIENT_CLINIC_OR_DEPARTMENT_OTHER): Payer: Medicare Other

## 2013-05-17 DIAGNOSIS — C9000 Multiple myeloma not having achieved remission: Secondary | ICD-10-CM

## 2013-05-17 LAB — CBC WITH DIFFERENTIAL/PLATELET
BASO%: 3.6 % — ABNORMAL HIGH (ref 0.0–2.0)
HCT: 31.8 % — ABNORMAL LOW (ref 38.4–49.9)
MCHC: 32.7 g/dL (ref 32.0–36.0)
MONO#: 0.4 10*3/uL (ref 0.1–0.9)
NEUT%: 62.2 % (ref 39.0–75.0)
RDW: 19.4 % — ABNORMAL HIGH (ref 11.0–14.6)
WBC: 3.4 10*3/uL — ABNORMAL LOW (ref 4.0–10.3)
lymph#: 0.5 10*3/uL — ABNORMAL LOW (ref 0.9–3.3)

## 2013-05-28 ENCOUNTER — Telehealth: Payer: Self-pay | Admitting: *Deleted

## 2013-05-28 DIAGNOSIS — C9000 Multiple myeloma not having achieved remission: Secondary | ICD-10-CM

## 2013-05-28 MED ORDER — LENALIDOMIDE 5 MG PO CAPS: 5.0000 mg | ORAL_CAPSULE | Freq: Every day | ORAL | Status: DC

## 2013-05-28 NOTE — Telephone Encounter (Signed)
THIS REFILL REQUEST FOR REVLIMID WAS PLACED IN DR.GRANFORTUNA'S ACTIVE WORK BOX. 

## 2013-05-28 NOTE — Addendum Note (Signed)
Addended by: Arvilla Meres on: 05/28/2013 02:57 PM   Modules accepted: Orders

## 2013-05-28 NOTE — Telephone Encounter (Signed)
RECEIVED A FAX FROM [facility] CONCERNING A CONFIRMATION OF FACSIMILE RECEIPT FOR PT. REFERRAL. 

## 2013-05-31 ENCOUNTER — Telehealth: Payer: Self-pay | Admitting: Oncology

## 2013-05-31 ENCOUNTER — Ambulatory Visit (HOSPITAL_BASED_OUTPATIENT_CLINIC_OR_DEPARTMENT_OTHER): Payer: Medicare Other | Admitting: Oncology

## 2013-05-31 ENCOUNTER — Other Ambulatory Visit (HOSPITAL_BASED_OUTPATIENT_CLINIC_OR_DEPARTMENT_OTHER): Payer: Medicare Other | Admitting: Lab

## 2013-05-31 VITALS — BP 164/56 | HR 52 | Temp 97.0°F | Resp 18 | Ht 66.0 in | Wt 256.5 lb

## 2013-05-31 DIAGNOSIS — J329 Chronic sinusitis, unspecified: Secondary | ICD-10-CM

## 2013-05-31 DIAGNOSIS — N049 Nephrotic syndrome with unspecified morphologic changes: Secondary | ICD-10-CM

## 2013-05-31 DIAGNOSIS — C9000 Multiple myeloma not having achieved remission: Secondary | ICD-10-CM

## 2013-05-31 DIAGNOSIS — N058 Unspecified nephritic syndrome with other morphologic changes: Secondary | ICD-10-CM

## 2013-05-31 DIAGNOSIS — N08 Glomerular disorders in diseases classified elsewhere: Secondary | ICD-10-CM

## 2013-05-31 LAB — CBC WITH DIFFERENTIAL/PLATELET
Basophils Absolute: 0.1 10*3/uL (ref 0.0–0.1)
Eosinophils Absolute: 0.3 10*3/uL (ref 0.0–0.5)
HCT: 30.9 % — ABNORMAL LOW (ref 38.4–49.9)
HGB: 10.3 g/dL — ABNORMAL LOW (ref 13.0–17.1)
MCH: 28.7 pg (ref 27.2–33.4)
MONO#: 0.3 10*3/uL (ref 0.1–0.9)
NEUT#: 2.1 10*3/uL (ref 1.5–6.5)
NEUT%: 64.9 % (ref 39.0–75.0)
RDW: 19.6 % — ABNORMAL HIGH (ref 11.0–14.6)
WBC: 3.2 10*3/uL — ABNORMAL LOW (ref 4.0–10.3)
lymph#: 0.4 10*3/uL — ABNORMAL LOW (ref 0.9–3.3)

## 2013-05-31 LAB — COMPREHENSIVE METABOLIC PANEL (CC13)
ALT: 19 U/L (ref 0–55)
AST: 15 U/L (ref 5–34)
Albumin: 2.8 g/dL — ABNORMAL LOW (ref 3.5–5.0)
Alkaline Phosphatase: 61 U/L (ref 40–150)
Calcium: 8.1 mg/dL — ABNORMAL LOW (ref 8.4–10.4)
Chloride: 112 mEq/L — ABNORMAL HIGH (ref 98–109)
Potassium: 4.1 mEq/L (ref 3.5–5.1)
Sodium: 142 mEq/L (ref 136–145)

## 2013-05-31 LAB — LACTATE DEHYDROGENASE (CC13): LDH: 71 U/L — ABNORMAL LOW (ref 125–245)

## 2013-05-31 NOTE — Telephone Encounter (Signed)
RECEIVED A FAX FROM [facility] CONCERNING A CONFIRMATION OF PRESCRIPTION SHIPMENT FOR REVLIMID ON 05/30/13. 

## 2013-05-31 NOTE — Telephone Encounter (Signed)
gv and printed appt sched and avs for pt  °

## 2013-06-01 NOTE — Progress Notes (Signed)
Hematology and Oncology Follow Up Visit  William Gaines [id number] 02-Sep-1941 72 y.o. 06/01/2013 11:44 AM   Principle Diagnosis: Encounter Diagnoses  Name Primary?  . Multiple myeloma without mention of remission Yes  . Myeloma kidney   . Nephrotic syndrome   . Sinusitis      Interim History:   Followup visit for this 72 year old man with non-secretory versus kappa light chain multiple myeloma initially diagnosed in October 2007 when he presented with severe back pain due to pathologic fractures of his spine. Initial treatment with palliative radiation. Subsequent surgical stabilization procedure. He went on to receive immunotherapy initially with Revlimid plus dexamethasone for 6 months. Subsequent discontinuation of dexamethasone due to uncontrolled diabetes and infection. He was followed until a new lytic lesion of his left iliac bone developed in March 2012. He received a course of palliative radiation. He was started back on chemotherapy with a combination of subcutaneous Velcade plus oral melphalan in August 2012. He achieved a nice partial response. Most recently he was put back on single agent Revlimid maintenance initially 10 mg daily beginning in August 2013. He has tolerated this well. I elected to decrease his dose to 5 mg daily in view of his chronic renal insufficiency.  Most recent laboratory evaluation done on June 13 showed stable kappa and lambda serum free light chains with a normal ratio again suggesting that his renal dysfunction is not due to myeloma but to medical renal disease. 24-hour urine total proteins  have fluctuated widely. He has primarily nonselective proteinuria. Most recent sample from 03/27/2013 with 2019 mg compare with 1075 in March, 1698 in January, and 2502 in December 2013. Free kappa light chains comprise 44 mg percent and this value has been stable over the last 7 months.  He reports no new areas of pain. He still has some chronic intermittent back pain  but does not like to take pain medications. He has had no interim infections.  Medications: reviewed  Allergies:  Allergies  Allergen Reactions  . Codeine     hallucinations  . Lorazepam (Lorazepam) Anxiety    Paradoxical agitation when given as pre med for bone marrow biopsy 09/11/06    Review of Systems: Constitutional:   No constitutional symptoms Respiratory: No cough. No dyspnea at rest. Positive dyspnea on exertion Cardiovascular:  No ischemic type chest pain or palpitations Gastrointestinal: No abdominal pain. Resolved diarrheal illness Genito-Urinary: No urinary tract symptoms Musculoskeletal: See above Neurologic: No headache or change in vision Skin: No rash or ecchymosis Remaining ROS negative.  Physical Exam: Blood pressure 164/56, pulse 52, temperature 97 F (36.1 C), temperature source Oral, resp. rate 18, height 5\' 6"  (1.676 m), weight 256 lb 8 oz (116.348 kg). Wt Readings from Last 3 Encounters:  05/31/13 256 lb 8 oz (116.348 kg)  04/05/13 252 lb 9.6 oz (114.579 kg)  02/08/13 254 lb 9.6 oz (115.486 kg)     General appearance: Obese Caucasian man HENNT: Pharynx no erythema or exudate Lymph nodes: No lymphadenopathy Breasts:  Lungs: Clear to auscultation resonant to percussion Heart: regular rhythm 2/6 systolic murmur second aortic  Abdomen: Soft, obese, nontender, large umbilicus hernia Extremities: 1+ edema and chronic venous stasis changes with edema being stable and less than on previous exams Musculoskeletal: No joint deformities GU: Vascular: No carotid bruits, no cyanosis Neurologic: Motor strength 5 over 5, reflexes 1+ symmetric, sensation intact to vibration over the fingertips Skin: No rash or ecchymosis  Lab Results: Lab Results  Component Value Date  WBC 3.2* 05/31/2013   HGB 10.3* 05/31/2013   HCT 30.9* 05/31/2013   MCV 86.2 05/31/2013   PLT 113* 05/31/2013     Chemistry      Component Value Date/Time   NA 142 05/31/2013 1449   NA 140  08/17/2012 1356   NA 137 07/07/2011 1217   K 4.1 05/31/2013 1449   K 3.7 08/17/2012 1356   K 4.8* 07/07/2011 1217   CL 104 04/19/2013 1300   CL 103 08/17/2012 1356   CL 99 07/07/2011 1217   CO2 20* 05/31/2013 1449   CO2 25 08/17/2012 1356   CO2 29 07/07/2011 1217   BUN 33.4* 05/31/2013 1449   BUN 34* 08/17/2012 1356   BUN 41* 07/07/2011 1217   CREATININE 2.4* 05/31/2013 1449   CREATININE 2.11* 08/17/2012 1356   CREATININE 2.1* 07/07/2011 1217   CREATININE 1.90* 03/25/2011 1008      Component Value Date/Time   CALCIUM 8.1* 05/31/2013 1449   CALCIUM 8.4 08/17/2012 1356   CALCIUM 8.7 07/07/2011 1217   ALKPHOS 61 05/31/2013 1449   ALKPHOS 54 08/17/2012 1356   AST 15 05/31/2013 1449   AST 17 08/17/2012 1356   ALT 19 05/31/2013 1449   ALT 31 08/17/2012 1356   BILITOT 0.26 05/31/2013 1449   BILITOT 0.3 08/17/2012 1356       Impression:  #1. Non-secretory myeloma in relapse Stable on single agent  Revlimid  5 mg daily.  .  #2. Idiopathic nephrotic syndrome   #3. Obstructive airway disease   #4. Insulin-dependent diabetes   #5. Essential hypertension    CC:. Dr. Lyn Hollingshead; Dr. Danella Sensing, MD 7/26/201411:44 AM

## 2013-06-03 LAB — KAPPA/LAMBDA LIGHT CHAINS
Kappa:Lambda Ratio: 1.61 (ref 0.26–1.65)
Lambda Free Lght Chn: 11.4 mg/dL — ABNORMAL HIGH (ref 0.57–2.63)

## 2013-06-17 ENCOUNTER — Other Ambulatory Visit: Payer: Self-pay | Admitting: Oncology

## 2013-06-21 ENCOUNTER — Ambulatory Visit (HOSPITAL_BASED_OUTPATIENT_CLINIC_OR_DEPARTMENT_OTHER): Payer: Medicare Other | Admitting: Nurse Practitioner

## 2013-06-21 ENCOUNTER — Ambulatory Visit (HOSPITAL_COMMUNITY)
Admission: RE | Admit: 2013-06-21 | Discharge: 2013-06-21 | Disposition: A | Discharge status: Home / Self Care | Payer: Medicare Other | Source: Ambulatory Visit | Attending: Oncology | Admitting: Oncology

## 2013-06-21 ENCOUNTER — Other Ambulatory Visit (HOSPITAL_BASED_OUTPATIENT_CLINIC_OR_DEPARTMENT_OTHER): Payer: Medicare Other

## 2013-06-21 ENCOUNTER — Telehealth: Payer: Self-pay | Admitting: Oncology

## 2013-06-21 VITALS — BP 166/46 | HR 55 | Temp 98.2°F | Resp 18 | Ht 66.0 in | Wt 256.2 lb

## 2013-06-21 DIAGNOSIS — J329 Chronic sinusitis, unspecified: Secondary | ICD-10-CM | POA: Insufficient documentation

## 2013-06-21 DIAGNOSIS — C9000 Multiple myeloma not having achieved remission: Secondary | ICD-10-CM

## 2013-06-21 DIAGNOSIS — N049 Nephrotic syndrome with unspecified morphologic changes: Secondary | ICD-10-CM

## 2013-06-21 DIAGNOSIS — E119 Type 2 diabetes mellitus without complications: Secondary | ICD-10-CM | POA: Insufficient documentation

## 2013-06-21 DIAGNOSIS — N058 Unspecified nephritic syndrome with other morphologic changes: Secondary | ICD-10-CM

## 2013-06-21 DIAGNOSIS — M2550 Pain in unspecified joint: Secondary | ICD-10-CM | POA: Insufficient documentation

## 2013-06-21 DIAGNOSIS — I10 Essential (primary) hypertension: Secondary | ICD-10-CM | POA: Insufficient documentation

## 2013-06-21 DIAGNOSIS — N08 Glomerular disorders in diseases classified elsewhere: Secondary | ICD-10-CM

## 2013-06-21 DIAGNOSIS — C9002 Multiple myeloma in relapse: Secondary | ICD-10-CM

## 2013-06-21 LAB — CBC WITH DIFFERENTIAL/PLATELET
BASO%: 2.4 % — ABNORMAL HIGH (ref 0.0–2.0)
EOS%: 7.1 % — ABNORMAL HIGH (ref 0.0–7.0)
HCT: 31.6 % — ABNORMAL LOW (ref 38.4–49.9)
LYMPH%: 11.3 % — ABNORMAL LOW (ref 14.0–49.0)
MCH: 28.5 pg (ref 27.2–33.4)
MCHC: 33.2 g/dL (ref 32.0–36.0)
MONO#: 0.4 10*3/uL (ref 0.1–0.9)
NEUT%: 67.4 % (ref 39.0–75.0)
Platelets: 138 10*3/uL — ABNORMAL LOW (ref 140–400)

## 2013-06-21 LAB — COMPREHENSIVE METABOLIC PANEL (CC13)
ALT: 17 U/L (ref 0–55)
BUN: 25.6 mg/dL (ref 7.0–26.0)
CO2: 22 mEq/L (ref 22–29)
Creatinine: 2.2 mg/dL — ABNORMAL HIGH (ref 0.7–1.3)
Total Bilirubin: 0.44 mg/dL (ref 0.20–1.20)

## 2013-06-21 NOTE — Telephone Encounter (Signed)
gv and printed appt sched and avs for pt  °

## 2013-06-21 NOTE — Progress Notes (Signed)
OFFICE PROGRESS NOTE  Interval history:   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 Initial diagnosis dates to October 2007 when he presented with severe back pain due to pathologic fractures of the spine. He was initially treated with palliative radiation. He subsequently underwent surgery. This was followed by Revlimid plus dexamethasone for 6 months. Dexamethasone was discontinued due to uncontrolled diabetes and infection. He was found to have a new lytic lesion of the left iliac bone in March 2012. He received a course of palliative radiation. He was started back on chemotherapy with subcutaneous Velcade plus oral melphalan August 2012 with a partial response. Most recently he was put back on treatment with single agent Revlimid maintenance 10 mg daily August 2013. The Revlimid dose was decreased to 5 mg daily following an office visit 02/08/2013 due to a decline in renal function.  Most recent laboratory analysis 05/31/2013 showed stable kappa and lambda free light chains with a normal ratio; creatinine improved at 2.4; hemoglobin, white count and platelets stable.  He is seen today for scheduled followup. Stable chronic back pain. Intermittent nausea. Food relieves the nausea. No diarrhea. No numbness or tingling in his hands or feet. Continued intermittent pain involving the ears and teeth. The pain occurs at least on a daily basis. He initially noted the pain 2-3 years ago. Warm air relieves the pain. He recently completed a Z-Pak with no change in his symptoms. He continues to have a good appetite.   Objective: Blood pressure 166/46, pulse 55, temperature 98.2 F (36.8 C), temperature source Oral, resp. rate 18, height 5\' 6"  (1.676 m), weight 256 lb 3.2 oz (116.212 kg).  Oropharynx is without thrush or ulceration. No palpable cervical or supraclavicular lymph nodes. Lungs are clear. No wheezes or rales.  Regular cardiac rhythm. Abdomen is soft, obese. Nontender. Umbilical hernia. 1+ lower leg edema and chronic venous stasis changes bilaterally. Motor strength 5 over 5. Vibratory sense intact over the fingertips per tuning fork exam.  Lab Results: Lab Results  Component Value Date   WBC 3.7* 06/21/2013   HGB 10.5* 06/21/2013   HCT 31.6* 06/21/2013   MCV 86.0 06/21/2013   PLT 138* 06/21/2013    Chemistry:    Chemistry      Component Value Date/Time   NA 140 06/21/2013 1414   NA 140 08/17/2012 1356   NA 137 07/07/2011 1217   K 3.6 06/21/2013 1414   K 3.7 08/17/2012 1356   K 4.8* 07/07/2011 1217   CL 104 04/19/2013 1300   CL 103 08/17/2012 1356   CL 99 07/07/2011 1217   CO2 22 06/21/2013 1414   CO2 25 08/17/2012 1356   CO2 29 07/07/2011 1217   BUN 25.6 06/21/2013 1414   BUN 34* 08/17/2012 1356   BUN 41* 07/07/2011 1217   CREATININE 2.2* 06/21/2013 1414   CREATININE 2.11* 08/17/2012 1356   CREATININE 2.1* 07/07/2011 1217   CREATININE 1.90* 03/25/2011 1008      Component Value Date/Time   CALCIUM 8.5 06/21/2013 1414   CALCIUM 8.4 08/17/2012 1356   CALCIUM 8.7 07/07/2011 1217   ALKPHOS 57 06/21/2013 1414   ALKPHOS 54 08/17/2012 1356   AST 15 06/21/2013 1414   AST 17 08/17/2012 1356   ALT 17 06/21/2013 1414   ALT 31 08/17/2012 1356   BILITOT 0.44 06/21/2013 1414   BILITOT 0.3 08/17/2012 1356       Studies/Results: Dg Sinuses Complete  06/21/2013   *  RADIOLOGY REPORT*  Clinical Data: Sinusitis  PARANASAL SINUSES - COMPLETE 3 + VIEW  Comparison: Metastatic bone survey a December 23, 2011  Findings:  There is a lytic bone lesion in the right frontal bone measuring 2.5 x 1.7 cm.  Paranasal sinuses are clear.  There is no air-fluid level.  There is no bony destruction or expansion in the paranasal sinus regions. Nasal septum is midline.  IMPRESSION: Paranasal sinuses are clear.  There is a lytic bone lesion in the right frontal bone region, a finding noted previously.   Original Report Authenticated  By: Bretta Bang, M.D.   Dg Bone Survey Met  06/21/2013   CLINICAL DATA:  Multiple myeloma. Generalized joint pain, worst in the lower back. History of hypertension and diabetes.  EXAM: METASTATIC BONE SURVEY  COMPARISON:  Bone survey 12/23/2011.  FINDINGS: A well-circumscribed 2.9 cm lucent lesion in the frontal bone is unchanged. No new calvarial lesions are identified. There is stable multilevel spondylosis. Postsurgical changes are stable status post L1-5 fusion and L2 corpectomy. No definite hardware loosening is identified. There are no focal lytic lesions or pathologic fractures in the spine.  The exophytic lesion projecting superiorly from the left iliac crest is stable. This has sclerotic margins and no pathologic fracture. There is a stable lucent lesion involving the left ischium. No new lytic lesions are seen within the pelvis.  No lytic lesion or pathologic fracture is identified within the proximal appendicular skeleton.  IMPRESSION: Stable chronic lucent lesions in the calvarium, left iliac bone and left ischium. No new lesions or pathologic fractures demonstrated.   Electronically Signed   By: Roxy Horseman   On: 06/21/2013 14:28    Medications: I have reviewed the patient's current medications.  Assessment/Plan:  1. Nonsecretory versus kappa light chain myeloma currently on Revlimid 5 mg daily. 2. Idiopathic nephrotic syndrome. 3. Obstructive airway disease. 4. Insulin-dependent diabetes. 5. Essential hypertension. 6. Intermittent ear and tooth pain. Sinus films done earlier today showed the paranasal sinuses are clear. No bony destruction or expansion in the paranasal sinus regions.  Disposition-[name] appears stable. He continues Revlimid 5 mg daily. Most recent serum light chain analysis was stable. Bone survey done earlier today showed stable lesions with no new lesions or pathologic fractures demonstrated. We will continue to check labs on a 2 week schedule. He will  return for a followup visit on 08/09/2013. He will contact the office in the interim with any problems.  Plan reviewed with Dr. Cyndie Chime.  Lonna Cobb ANP/GNP-BC

## 2013-06-24 LAB — KAPPA/LAMBDA LIGHT CHAINS
Kappa:Lambda Ratio: 1.47 (ref 0.26–1.65)
Lambda Free Lght Chn: 12.3 mg/dL — ABNORMAL HIGH (ref 0.57–2.63)

## 2013-06-24 LAB — IGG: IgG (Immunoglobin G), Serum: 1760 mg/dL — ABNORMAL HIGH (ref 650–1600)

## 2013-06-25 LAB — UIFE/LIGHT CHAINS/TP QN, 24-HR UR
Beta, Urine: DETECTED — AB
Free Lambda Lt Chains,Ur: 16.5 mg/dL — ABNORMAL HIGH (ref 0.02–0.67)
Free Lt Chn Excr Rate: 1392.3 mg/d
Gamma Globulin, Urine: DETECTED — AB
Volume, Urine: 1700 mL

## 2013-06-26 ENCOUNTER — Telehealth: Payer: Self-pay | Admitting: *Deleted

## 2013-06-26 NOTE — Telephone Encounter (Signed)
[facility] faxed Revlimid refill request.  Request to provider for review. 

## 2013-06-27 ENCOUNTER — Other Ambulatory Visit: Payer: Self-pay | Admitting: *Deleted

## 2013-06-27 DIAGNOSIS — C9000 Multiple myeloma not having achieved remission: Secondary | ICD-10-CM

## 2013-06-27 MED ORDER — LENALIDOMIDE 5 MG PO CAPS: 5.0000 mg | ORAL_CAPSULE | Freq: Every day | ORAL | Status: DC

## 2013-07-04 NOTE — Telephone Encounter (Signed)
RECEIVED A FAX FROM [facility] CONCERNING A CONFIRMATION OF PRESCRIPTION SHIPMENT FOR REVLIMID ON 07/02/13.

## 2013-07-05 ENCOUNTER — Other Ambulatory Visit: Payer: Medicare Other

## 2013-07-05 ENCOUNTER — Other Ambulatory Visit (HOSPITAL_BASED_OUTPATIENT_CLINIC_OR_DEPARTMENT_OTHER): Payer: Medicare Other | Admitting: Lab

## 2013-07-05 DIAGNOSIS — C9000 Multiple myeloma not having achieved remission: Secondary | ICD-10-CM

## 2013-07-05 LAB — CBC WITH DIFFERENTIAL/PLATELET
BASO%: 2.8 % — ABNORMAL HIGH (ref 0.0–2.0)
EOS%: 8.7 % — ABNORMAL HIGH (ref 0.0–7.0)
HCT: 32.6 % — ABNORMAL LOW (ref 38.4–49.9)
MCHC: 32.8 g/dL (ref 32.0–36.0)
MONO#: 0.4 10*3/uL (ref 0.1–0.9)
RBC: 3.79 10*6/uL — ABNORMAL LOW (ref 4.20–5.82)
RDW: 18.7 % — ABNORMAL HIGH (ref 11.0–14.6)
WBC: 3.2 10*3/uL — ABNORMAL LOW (ref 4.0–10.3)
lymph#: 0.5 10*3/uL — ABNORMAL LOW (ref 0.9–3.3)

## 2013-07-19 ENCOUNTER — Other Ambulatory Visit: Payer: Medicare Other

## 2013-07-19 ENCOUNTER — Telehealth: Payer: Self-pay | Admitting: Oncology

## 2013-07-19 NOTE — Telephone Encounter (Signed)
Pt called and r/s labs today to 9/16

## 2013-07-23 ENCOUNTER — Other Ambulatory Visit (HOSPITAL_BASED_OUTPATIENT_CLINIC_OR_DEPARTMENT_OTHER): Payer: Medicare Other | Admitting: Lab

## 2013-07-23 DIAGNOSIS — C9000 Multiple myeloma not having achieved remission: Secondary | ICD-10-CM

## 2013-07-23 LAB — CBC WITH DIFFERENTIAL/PLATELET
Basophils Absolute: 0.1 10*3/uL (ref 0.0–0.1)
HCT: 33.4 % — ABNORMAL LOW (ref 38.4–49.9)
HGB: 10.9 g/dL — ABNORMAL LOW (ref 13.0–17.1)
MONO#: 0.4 10*3/uL (ref 0.1–0.9)
NEUT#: 2.1 10*3/uL (ref 1.5–6.5)
NEUT%: 61.4 % (ref 39.0–75.0)
RDW: 18.8 % — ABNORMAL HIGH (ref 11.0–14.6)
WBC: 3.4 10*3/uL — ABNORMAL LOW (ref 4.0–10.3)
lymph#: 0.5 10*3/uL — ABNORMAL LOW (ref 0.9–3.3)

## 2013-07-25 ENCOUNTER — Telehealth: Payer: Self-pay | Admitting: *Deleted

## 2013-07-25 DIAGNOSIS — C9000 Multiple myeloma not having achieved remission: Secondary | ICD-10-CM

## 2013-07-25 NOTE — Telephone Encounter (Signed)
THIS REFILL REQUEST FOR REVLIMID WAS PLACED IN DR.GRANFORTUNA'S ACTIVE WORK BOX. 

## 2013-07-29 MED ORDER — LENALIDOMIDE 5 MG PO CAPS: 5.0000 mg | ORAL_CAPSULE | Freq: Every day | ORAL | Status: DC

## 2013-07-29 NOTE — Addendum Note (Signed)
Addended by: Arvilla Meres on: 07/29/2013 02:19 PM   Modules accepted: Orders

## 2013-07-30 NOTE — Telephone Encounter (Signed)
RECEIVED A FAX FROM [facility] CONCERNING A CONFIRMATION OF FACSIMILE RECEIPT FOR PT. REFERRAL. 

## 2013-07-31 NOTE — Telephone Encounter (Signed)
Confirmation fax from [facility] that Revlimid being shipped 9/23 with next day delivery.

## 2013-08-02 ENCOUNTER — Other Ambulatory Visit (HOSPITAL_BASED_OUTPATIENT_CLINIC_OR_DEPARTMENT_OTHER): Payer: Medicare Other | Admitting: Lab

## 2013-08-02 DIAGNOSIS — C9000 Multiple myeloma not having achieved remission: Secondary | ICD-10-CM

## 2013-08-02 LAB — CBC WITH DIFFERENTIAL/PLATELET
Basophils Absolute: 0.1 10*3/uL (ref 0.0–0.1)
EOS%: 8.4 % — ABNORMAL HIGH (ref 0.0–7.0)
Eosinophils Absolute: 0.3 10*3/uL (ref 0.0–0.5)
HCT: 32.7 % — ABNORMAL LOW (ref 38.4–49.9)
HGB: 10.8 g/dL — ABNORMAL LOW (ref 13.0–17.1)
MCH: 28.3 pg (ref 27.2–33.4)
MCV: 86.1 fL (ref 79.3–98.0)
MONO%: 13.4 % (ref 0.0–14.0)
NEUT#: 2 10*3/uL (ref 1.5–6.5)
NEUT%: 59.6 % (ref 39.0–75.0)
RDW: 18.3 % — ABNORMAL HIGH (ref 11.0–14.6)
lymph#: 0.5 10*3/uL — ABNORMAL LOW (ref 0.9–3.3)

## 2013-08-09 ENCOUNTER — Other Ambulatory Visit (HOSPITAL_BASED_OUTPATIENT_CLINIC_OR_DEPARTMENT_OTHER): Payer: Medicare Other

## 2013-08-09 ENCOUNTER — Ambulatory Visit (HOSPITAL_BASED_OUTPATIENT_CLINIC_OR_DEPARTMENT_OTHER): Payer: Medicare Other | Admitting: Nurse Practitioner

## 2013-08-09 VITALS — BP 165/72 | HR 72 | Temp 97.7°F | Resp 20 | Ht 66.0 in | Wt 257.8 lb

## 2013-08-09 DIAGNOSIS — C9000 Multiple myeloma not having achieved remission: Secondary | ICD-10-CM

## 2013-08-09 DIAGNOSIS — I10 Essential (primary) hypertension: Secondary | ICD-10-CM

## 2013-08-09 DIAGNOSIS — E119 Type 2 diabetes mellitus without complications: Secondary | ICD-10-CM

## 2013-08-09 DIAGNOSIS — R209 Unspecified disturbances of skin sensation: Secondary | ICD-10-CM

## 2013-08-09 LAB — COMPREHENSIVE METABOLIC PANEL (CC13)
AST: 12 U/L (ref 5–34)
Alkaline Phosphatase: 61 U/L (ref 40–150)
BUN: 35.8 mg/dL — ABNORMAL HIGH (ref 7.0–26.0)
Creatinine: 2.8 mg/dL — ABNORMAL HIGH (ref 0.7–1.3)
Glucose: 80 mg/dl (ref 70–140)
Potassium: 4.2 mEq/L (ref 3.5–5.1)
Total Bilirubin: 0.24 mg/dL (ref 0.20–1.20)

## 2013-08-09 LAB — CBC WITH DIFFERENTIAL/PLATELET
Basophils Absolute: 0 10*3/uL (ref 0.0–0.1)
EOS%: 7.3 % — ABNORMAL HIGH (ref 0.0–7.0)
Eosinophils Absolute: 0.2 10*3/uL (ref 0.0–0.5)
HGB: 10.3 g/dL — ABNORMAL LOW (ref 13.0–17.1)
LYMPH%: 16.8 % (ref 14.0–49.0)
MCH: 28.4 pg (ref 27.2–33.4)
MCV: 86 fL (ref 79.3–98.0)
MONO%: 12.2 % (ref 0.0–14.0)
NEUT#: 1.8 10*3/uL (ref 1.5–6.5)
Platelets: 107 10*3/uL — ABNORMAL LOW (ref 140–400)
RDW: 18.1 % — ABNORMAL HIGH (ref 11.0–14.6)

## 2013-08-09 LAB — LACTATE DEHYDROGENASE (CC13): LDH: 69 U/L — ABNORMAL LOW (ref 125–245)

## 2013-08-09 NOTE — Progress Notes (Signed)
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 Initial diagnosis dates to October 2007 when he presented with severe back pain due to pathologic fractures of the spine. He was initially treated with palliative radiation. He subsequently underwent surgery. This was followed by Revlimid plus dexamethasone for 6 months. Dexamethasone was discontinued due to uncontrolled diabetes and infection. He was found to have a new lytic lesion of the left iliac bone in March 2012. He received a course of palliative radiation. He was started back on chemotherapy with subcutaneous Velcade plus oral melphalan August 2012 with a partial response. Most recently he was put back on treatment with single agent Revlimid maintenance 10 mg daily August 2013. The Revlimid dose was decreased to 5 mg daily following an office visit 02/08/2013 due to a decline in renal function.  Most recent laboratory analysis on 06/21/2013 showed stable serum light chains and stable to slightly increased serum IgG.  He is seen today for scheduled followup.  No interim illnesses or infections. He continues Revlimid 5 mg daily. He denies nausea/vomiting. No mouth sores. He has occasional loose stools. No skin rash. No hematuria or dysuria. He notes that he "sleeps a lot". No fevers or sweats. Appetite is good. Stable mild dyspnea on exertion. He has an occasional cough. Stable chronic back pain. He reports mild numbness in the fingertips for many years. He does not think the numbness has changed. He notes difficulty turning pages of a newspaper.   Objective: Blood pressure 165/72, pulse 72, temperature 97.7 F (36.5 C), temperature source Oral, resp. rate 20, height 5\' 6"  (1.676 m), weight 257 lb 12.8 oz (116.937 kg).  Oropharynx is without thrush or ulceration. No palpable cervical or supraclavicular lymph nodes. Lungs are  clear. No wheezes or rales. Regular cardiac rhythm. Abdomen is soft, obese. Nontender. He has an umbilical hernia. 1+ lower leg edema and chronic venous stasis changes bilaterally. Motor strength 5 over 5. Vibratory sense mildly decreased over the fingertips per tuning fork exam.  Lab Results: Lab Results  Component Value Date   WBC 2.9* 08/09/2013   HGB 10.3* 08/09/2013   HCT 31.2* 08/09/2013   MCV 86.0 08/09/2013   PLT 107* 08/09/2013    Chemistry:    Chemistry      Component Value Date/Time   NA 140 06/21/2013 1414   NA 140 08/17/2012 1356   NA 137 07/07/2011 1217   K 3.6 06/21/2013 1414   K 3.7 08/17/2012 1356   K 4.8* 07/07/2011 1217   CL 104 04/19/2013 1300   CL 103 08/17/2012 1356   CL 99 07/07/2011 1217   CO2 22 06/21/2013 1414   CO2 25 08/17/2012 1356   CO2 29 07/07/2011 1217   BUN 25.6 06/21/2013 1414   BUN 34* 08/17/2012 1356   BUN 41* 07/07/2011 1217   CREATININE 2.2* 06/21/2013 1414   CREATININE 2.11* 08/17/2012 1356   CREATININE 2.1* 07/07/2011 1217   CREATININE 1.90* 03/25/2011 1008      Component Value Date/Time   CALCIUM 8.5 06/21/2013 1414   CALCIUM 8.4 08/17/2012 1356   CALCIUM 8.7 07/07/2011 1217   ALKPHOS 57 06/21/2013 1414   ALKPHOS 54 08/17/2012 1356   AST 15 06/21/2013 1414   AST 17 08/17/2012 1356   ALT 17 06/21/2013 1414   ALT 31 08/17/2012 1356   BILITOT 0.44 06/21/2013 1414   BILITOT 0.3 08/17/2012 1356  Studies/Results: No results found.  Medications: I have reviewed the patient's current medications.  Assessment/Plan:  1. Nonsecretory versus kappa light chain myeloma currently on Revlimid 5 mg daily. Myeloma labs 06/21/2013 stable. 2. Idiopathic nephrotic syndrome. 3. Obstructive airway disease. 4. Insulin-dependent diabetes. 5. Essential hypertension. 6. Intermittent ear and tooth pain. Sinus films done earlier today showed the paranasal sinuses are clear. No bony destruction or expansion in the paranasal sinus regions. He did not complain  of this pain at today's visit. 7. Mild numbness in the fingertips. Per his report unchanged over many years. We will continue to monitor.  Disposition-Mr. Moser appears stable. He continues Revlimid 5 mg daily. We will followup on the serum light chain analysis from today.   We will continue labs on a 2 week schedule.   He has a followup visit with Dr. Cyndie Chime in approximately 6 weeks. He will contact the office between visits with any problems.  Plan reviewed with Dr. Cyndie Chime.  William Gaines ANP/GNP-BC

## 2013-08-12 LAB — KAPPA/LAMBDA LIGHT CHAINS
Kappa:Lambda Ratio: 1.68 — ABNORMAL HIGH (ref 0.26–1.65)
Lambda Free Lght Chn: 13.7 mg/dL — ABNORMAL HIGH (ref 0.57–2.63)

## 2013-08-14 ENCOUNTER — Other Ambulatory Visit: Payer: Self-pay | Admitting: Oncology

## 2013-08-15 ENCOUNTER — Telehealth: Payer: Self-pay | Admitting: *Deleted

## 2013-08-15 DIAGNOSIS — C9000 Multiple myeloma not having achieved remission: Secondary | ICD-10-CM

## 2013-08-15 NOTE — Telephone Encounter (Signed)
Received message 08/14/13 Pt requesting lab results and "will I get Zometa 08/23/13?".  Per Dr. Cyndie Chime, called and spoke with pt; explained labs stable.  Pt asking specifically  what creatine level is--informed pt it was 2.8.  After conferring with pharmacy; explained to pt that office will clarify with MD re: Zometa and call pt next week.  (Zometa held 02/22/13 for crea 2.8)  Pt verbalized understanding and wait for call next week.  Note to Dr. Cyndie Chime.

## 2013-08-16 ENCOUNTER — Other Ambulatory Visit: Payer: Medicare Other

## 2013-08-19 NOTE — Addendum Note (Signed)
Addended by: Gala Romney on: 08/19/2013 02:11 PM   Modules accepted: Orders

## 2013-08-19 NOTE — Telephone Encounter (Signed)
Per Dr. Cyndie Chime; called and spoke with pt instructed him that MD wants him to receive his Zometa 10/17 despite crea 2.8.  Pt verbalized understanding and confirmed appt 08/23/13.

## 2013-08-22 ENCOUNTER — Other Ambulatory Visit: Payer: Self-pay | Admitting: *Deleted

## 2013-08-22 ENCOUNTER — Telehealth: Payer: Self-pay | Admitting: *Deleted

## 2013-08-22 DIAGNOSIS — C9000 Multiple myeloma not having achieved remission: Secondary | ICD-10-CM

## 2013-08-22 NOTE — Telephone Encounter (Signed)
[facility] called and moved his appts from the morning to the afternoon.  JMW

## 2013-08-22 NOTE — Telephone Encounter (Signed)
THIS REFILL REQUEST FOR REVLIMID WAS PLACED IN DR.GRANFORTUNA'S ACTIVE WORK BOX. 

## 2013-08-22 NOTE — Telephone Encounter (Signed)
Routed recent labs to Dr. Keturah Shavers office per request.

## 2013-08-23 ENCOUNTER — Other Ambulatory Visit: Payer: Medicare Other | Admitting: Lab

## 2013-08-23 ENCOUNTER — Telehealth: Payer: Self-pay | Admitting: *Deleted

## 2013-08-23 ENCOUNTER — Ambulatory Visit: Payer: Medicare Other

## 2013-08-23 MED ORDER — LENALIDOMIDE 5 MG PO CAPS: 5.0000 mg | ORAL_CAPSULE | Freq: Every day | ORAL | Status: DC

## 2013-08-23 NOTE — Telephone Encounter (Signed)
[facility] called and moved appts from today to next week. Desk RN notified.

## 2013-08-23 NOTE — Addendum Note (Signed)
Addended by: Arvilla Meres on: 08/23/2013 11:32 AM   Modules accepted: Orders

## 2013-08-27 ENCOUNTER — Encounter: Payer: Self-pay | Admitting: Oncology

## 2013-08-27 NOTE — Progress Notes (Signed)
Received letter from Patient [employer].  Pt is approved for Revlimid.  Reimbursement is eligible for 08/16/13 to 2014-08-30 or when benefit cap has been met.  Amount of grant is $10,000.  I will forward copy of letter to Medical records to be scanned in pt's chart.

## 2013-08-28 ENCOUNTER — Ambulatory Visit (HOSPITAL_BASED_OUTPATIENT_CLINIC_OR_DEPARTMENT_OTHER): Payer: Medicare Other

## 2013-08-28 ENCOUNTER — Other Ambulatory Visit (HOSPITAL_BASED_OUTPATIENT_CLINIC_OR_DEPARTMENT_OTHER): Payer: Medicare Other | Admitting: Lab

## 2013-08-28 VITALS — BP 151/63 | HR 65 | Temp 97.3°F | Resp 18

## 2013-08-28 DIAGNOSIS — C9000 Multiple myeloma not having achieved remission: Secondary | ICD-10-CM

## 2013-08-28 DIAGNOSIS — C9002 Multiple myeloma in relapse: Secondary | ICD-10-CM

## 2013-08-28 LAB — COMPREHENSIVE METABOLIC PANEL (CC13)
AST: 12 U/L (ref 5–34)
Albumin: 2.3 g/dL — ABNORMAL LOW (ref 3.5–5.0)
Alkaline Phosphatase: 60 U/L (ref 40–150)
Calcium: 8 mg/dL — ABNORMAL LOW (ref 8.4–10.4)
Chloride: 111 mEq/L — ABNORMAL HIGH (ref 98–109)
Glucose: 128 mg/dl (ref 70–140)
Potassium: 4.1 mEq/L (ref 3.5–5.1)
Sodium: 140 mEq/L (ref 136–145)
Total Protein: 6.6 g/dL (ref 6.4–8.3)

## 2013-08-28 LAB — CBC WITH DIFFERENTIAL/PLATELET
Basophils Absolute: 0.1 10*3/uL (ref 0.0–0.1)
Eosinophils Absolute: 0.2 10*3/uL (ref 0.0–0.5)
HGB: 10.5 g/dL — ABNORMAL LOW (ref 13.0–17.1)
MCV: 85.4 fL (ref 79.3–98.0)
MONO%: 10.5 % (ref 0.0–14.0)
NEUT#: 2 10*3/uL (ref 1.5–6.5)
RBC: 3.77 10*6/uL — ABNORMAL LOW (ref 4.20–5.82)
RDW: 18.5 % — ABNORMAL HIGH (ref 11.0–14.6)
WBC: 3.1 10*3/uL — ABNORMAL LOW (ref 4.0–10.3)
lymph#: 0.5 10*3/uL — ABNORMAL LOW (ref 0.9–3.3)

## 2013-08-28 MED ORDER — ZOLEDRONIC ACID 4 MG/100ML IV SOLN
4.0000 mg | Freq: Once | INTRAVENOUS | Status: AC
  Administered 2013-08-28: 4 mg via INTRAVENOUS
  Filled 2013-08-28: qty 100

## 2013-08-28 MED ORDER — SODIUM CHLORIDE 0.9 % IV SOLN
Freq: Once | INTRAVENOUS | Status: AC
  Administered 2013-08-28: 15:00:00 via INTRAVENOUS

## 2013-08-28 NOTE — Progress Notes (Signed)
OK to treat

## 2013-08-28 NOTE — Patient Instructions (Signed)
Zoledronic Acid injection (Hypercalcemia, Oncology) (Zometa) What is this medicine? ZOLEDRONIC ACID (ZOE le dron ik AS id) lowers the amount of calcium loss from bone. It is used to treat too much calcium in your blood from cancer. It is also used to prevent complications of cancer that has spread to the bone. This medicine may be used for other purposes; ask your health care provider or pharmacist if you have questions. What should I tell my health care provider before I take this medicine? They need to know if you have any of these conditions: -aspirin-sensitive asthma -dental disease -kidney disease -an unusual or allergic reaction to zoledronic acid, other medicines, foods, dyes, or preservatives -pregnant or trying to get pregnant -breast-feeding How should I use this medicine? This medicine is for infusion into a vein. It is given by a health care professional in a hospital or clinic setting. Talk to your pediatrician regarding the use of this medicine in children. Special care may be needed. Overdosage: If you think you have taken too much of this medicine contact a poison control center or emergency room at once. NOTE: This medicine is only for you. Do not share this medicine with others. What if I miss a dose? It is important not to miss your dose. Call your doctor or health care professional if you are unable to keep an appointment. What may interact with this medicine? -certain antibiotics given by injection -NSAIDs, medicines for pain and inflammation, like ibuprofen or naproxen -some diuretics like bumetanide, furosemide -teriparatide -thalidomide This list may not describe all possible interactions. Give your health care provider a list of all the medicines, herbs, non-prescription drugs, or dietary supplements you use. Also tell them if you smoke, drink alcohol, or use illegal drugs. Some items may interact with your medicine. What should I watch for while using this  medicine? Visit your doctor or health care professional for regular checkups. It may be some time before you see the benefit from this medicine. Do not stop taking your medicine unless your doctor tells you to. Your doctor may order blood tests or other tests to see how you are doing. Women should inform their doctor if they wish to become pregnant or think they might be pregnant. There is a potential for serious side effects to an unborn child. Talk to your health care professional or pharmacist for more information. You should make sure that you get enough calcium and vitamin D while you are taking this medicine. Discuss the foods you eat and the vitamins you take with your health care professional. Some people who take this medicine have severe bone, joint, and/or muscle pain. This medicine may also increase your risk for a broken thigh bone. Tell your doctor right away if you have pain in your upper leg or groin. Tell your doctor if you have any pain that does not go away or that gets worse. What side effects may I notice from receiving this medicine? Side effects that you should report to your doctor or health care professional as soon as possible: -allergic reactions like skin rash, itching or hives, swelling of the face, lips, or tongue -anxiety, confusion, or depression -breathing problems -changes in vision -feeling faint or lightheaded, falls -jaw burning, cramping, pain -muscle cramps, stiffness, or weakness -trouble passing urine or change in the amount of urine Side effects that usually do not require medical attention (report to your doctor or health care professional if they continue or are bothersome): -bone, joint, or muscle  pain -fever -hair loss -irritation at site where injected -loss of appetite -nausea, vomiting -stomach upset -tired This list may not describe all possible side effects. Call your doctor for medical advice about side effects. You may report side effects  to FDA at 1-800-FDA-1088. Where should I keep my medicine? This drug is given in a hospital or clinic and will not be stored at home. NOTE: This sheet is a summary. It may not cover all possible information. If you have questions about this medicine, talk to your doctor, pharmacist, or health care provider.  2012, [employer]vier/Gold Standard. (04/22/2011 9:06:58 AM)

## 2013-08-30 ENCOUNTER — Other Ambulatory Visit: Payer: Medicare Other

## 2013-09-06 ENCOUNTER — Other Ambulatory Visit (HOSPITAL_BASED_OUTPATIENT_CLINIC_OR_DEPARTMENT_OTHER): Payer: Medicare Other

## 2013-09-06 DIAGNOSIS — C9000 Multiple myeloma not having achieved remission: Secondary | ICD-10-CM

## 2013-09-06 LAB — CBC WITH DIFFERENTIAL/PLATELET
BASO%: 3 % — ABNORMAL HIGH (ref 0.0–2.0)
LYMPH%: 15.9 % (ref 14.0–49.0)
MCHC: 33 g/dL (ref 32.0–36.0)
MCV: 85.2 fL (ref 79.3–98.0)
MONO%: 11.4 % (ref 0.0–14.0)
Platelets: 107 10*3/uL — ABNORMAL LOW (ref 140–400)
RBC: 3.59 10*6/uL — ABNORMAL LOW (ref 4.20–5.82)
RDW: 16.7 % — ABNORMAL HIGH (ref 11.0–14.6)
WBC: 2.7 10*3/uL — ABNORMAL LOW (ref 4.0–10.3)

## 2013-09-13 ENCOUNTER — Other Ambulatory Visit: Payer: Medicare Other

## 2013-09-19 ENCOUNTER — Telehealth: Payer: Self-pay | Admitting: *Deleted

## 2013-09-19 DIAGNOSIS — C9000 Multiple myeloma not having achieved remission: Secondary | ICD-10-CM

## 2013-09-19 MED ORDER — LENALIDOMIDE 5 MG PO CAPS: 5.0000 mg | ORAL_CAPSULE | Freq: Every day | ORAL | Status: DC

## 2013-09-24 NOTE — Telephone Encounter (Signed)
RECEIVED A FAX FROM [facility] CONCERNING A CONFIRMATION OF PRESCRIPTION SHIPMENT FOR REVLIMID ON 09/23/13.

## 2013-09-27 ENCOUNTER — Other Ambulatory Visit (HOSPITAL_BASED_OUTPATIENT_CLINIC_OR_DEPARTMENT_OTHER): Payer: Medicare Other | Admitting: Lab

## 2013-09-27 ENCOUNTER — Ambulatory Visit (HOSPITAL_BASED_OUTPATIENT_CLINIC_OR_DEPARTMENT_OTHER): Payer: Medicare Other | Admitting: Oncology

## 2013-09-27 ENCOUNTER — Telehealth: Payer: Self-pay | Admitting: Oncology

## 2013-09-27 VITALS — BP 183/54 | HR 55 | Temp 98.2°F | Resp 18 | Ht 66.0 in | Wt 255.5 lb

## 2013-09-27 DIAGNOSIS — N184 Chronic kidney disease, stage 4 (severe): Secondary | ICD-10-CM

## 2013-09-27 DIAGNOSIS — C9000 Multiple myeloma not having achieved remission: Secondary | ICD-10-CM

## 2013-09-27 DIAGNOSIS — C9001 Multiple myeloma in remission: Secondary | ICD-10-CM

## 2013-09-27 DIAGNOSIS — N049 Nephrotic syndrome with unspecified morphologic changes: Secondary | ICD-10-CM

## 2013-09-27 DIAGNOSIS — E119 Type 2 diabetes mellitus without complications: Secondary | ICD-10-CM

## 2013-09-27 LAB — COMPREHENSIVE METABOLIC PANEL (CC13)
ALT: 21 U/L (ref 0–55)
Anion Gap: 8 mEq/L (ref 3–11)
CO2: 21 mEq/L — ABNORMAL LOW (ref 22–29)
Chloride: 112 mEq/L — ABNORMAL HIGH (ref 98–109)
Potassium: 3.5 mEq/L (ref 3.5–5.1)
Sodium: 142 mEq/L (ref 136–145)
Total Bilirubin: 0.24 mg/dL (ref 0.20–1.20)
Total Protein: 6.6 g/dL (ref 6.4–8.3)

## 2013-09-27 LAB — CBC WITH DIFFERENTIAL/PLATELET
BASO%: 4.1 % — ABNORMAL HIGH (ref 0.0–2.0)
MCHC: 32.6 g/dL (ref 32.0–36.0)
MONO#: 0.4 10*3/uL (ref 0.1–0.9)
RBC: 3.98 10*6/uL — ABNORMAL LOW (ref 4.20–5.82)
RDW: 18.4 % — ABNORMAL HIGH (ref 11.0–14.6)
WBC: 3.3 10*3/uL — ABNORMAL LOW (ref 4.0–10.3)
lymph#: 0.5 10*3/uL — ABNORMAL LOW (ref 0.9–3.3)

## 2013-09-27 LAB — LACTATE DEHYDROGENASE (CC13): LDH: 82 U/L — ABNORMAL LOW (ref 125–245)

## 2013-09-27 NOTE — Telephone Encounter (Signed)
Gave pt appt for labs and ML on December 2014 and january 2015

## 2013-09-30 NOTE — Progress Notes (Signed)
Hematology and Oncology Follow Up Visit  William Gaines [id number] 1941-10-31 72 y.o. 09/30/2013 6:23 PM   Principle Diagnosis: Encounter Diagnoses  Name Primary?  . Multiple myeloma without mention of remission Yes  . Nephrotic syndrome   . Chronic renal insufficiency, stage IV (severe)      Interim History:   Followup visit for this 72 year old man with non-secretory versus kappa light chain multiple myeloma initially diagnosed in October 2007 when he presented with severe back pain due to pathologic fractures of his spine. Initial treatment with palliative radiation. Subsequent surgical stabilization procedure. He went on to receive immunotherapy initially with Revlimid plus dexamethasone for 6 months. Subsequent discontinuation of dexamethasone due to uncontrolled diabetes and infection. He was followed until a new lytic lesion of his left iliac bone developed in March 2012. He received a course of palliative radiation. He was started back on chemotherapy with a combination of subcutaneous Velcade plus oral melphalan in August 2012. He achieved a nice partial response. Most recently he was put back on single agent Revlimid maintenance initially 10 mg daily beginning in August 2013. He has tolerated this well. I elected to decrease his dose to 5 mg daily in view of his chronic renal insufficiency.  Laboratory evaluation done on November 21 showed stable kappa and lambda serum free light chains with a near normal ratio again suggesting that his renal dysfunction is not due to myeloma but to medical renal disease. 24-hour urine total proteins have fluctuated widely. He has primarily nonselective proteinuria. Most recent sample from 06/21/2013 showed a sudden rise in 24 hour total protein from 2 g in May up to 6.8 g. Over the same interval, his creatinine and his CBC have remained stable.  He has had no interim medical problems. He still has intermittent pain in his back but no new areas of  pain.  His wife is doing poorly. She has advanced obstructive airway disease and received radiation treatments last year for non-small cell lung cancer.    Medications: reviewed  Allergies:  Allergies  Allergen Reactions  . Codeine     hallucinations  . Lorazepam [Lorazepam] Anxiety    Paradoxical agitation when given as pre med for bone marrow biopsy 09/11/06    Review of Systems:  ENT ROS: No sore throat Respiratory ROS: No cough or dyspnea Cardiovascular ROS:   No chest pain or palpitations Gastrointestinal ROS:  No abdominal pain or change in bowel habit Genito-Urinary ROS: No urinary tract symptoms Musculoskeletal ROS: See above Neurological ROS: No headache or change in vision Dermatological ROS: No rash or ecchymosis Remaining ROS negative.  Physical Exam: Blood pressure 183/54, pulse 55, temperature 98.2 F (36.8 C), temperature source Oral, resp. rate 18, height 5\' 6"  (1.676 m), weight 255 lb 8 oz (115.894 kg), SpO2 100.00%. Wt Readings from Last 3 Encounters:  09/27/13 255 lb 8 oz (115.894 kg)  08/09/13 257 lb 12.8 oz (116.937 kg)  06/21/13 256 lb 3.2 oz (116.212 kg)     General appearance: Pleasant, obese, Caucasian man HENNT: Pharynx no erythema, exudate, mass, or ulcer. No thyromegaly or thyroid nodules Lymph nodes: No cervical, supraclavicular, or axillary lymphadenopathy Breasts: Lungs: Clear to auscultation, resonant to percussion throughout Heart: Regular rhythm, no murmur, no gallop, no rub, no click, no edema Abdomen: Soft, nontender, normal bowel sounds, no mass, no organomegaly. Umbilical hernia. Extremities: Chronic 1-2+ edema, no calf tenderness Musculoskeletal: no joint deformities GU:  Vascular: Carotid pulses 2+, no bruits, Neurologic: Alert, oriented, PERRLA, , cranial  nerves grossly normal, motor strength 5 over 5, reflexes 1+ symmetric, upper body coordination normal, gait normal, Skin: No rash or ecchymosis  Lab Results: CBC W/Diff     Component Value Date/Time   WBC 3.3* 09/27/2013 1512   WBC 5.2 10/04/2011 1750   WBC 3.1* 07/07/2011 1217   RBC 3.98* 09/27/2013 1512   RBC 3.03* 10/04/2011 1750   HGB 11.2* 09/27/2013 1512   HGB 9.7* 10/04/2011 1750   HGB 10.0* 07/07/2011 1217   HCT 34.3* 09/27/2013 1512   HCT 28.7* 10/04/2011 1750   HCT 29.3* 07/07/2011 1217   PLT 114* 09/27/2013 1512   PLT 67* 10/04/2011 1750   PLT 195 07/07/2011 1217   MCV 86.0 09/27/2013 1512   MCV 94.7 10/04/2011 1750   MCV 88 07/07/2011 1217   MCH 28.0 09/27/2013 1512   MCH 32.0 10/04/2011 1750   MCH 29.9 07/07/2011 1217   MCHC 32.6 09/27/2013 1512   MCHC 33.8 10/04/2011 1750   MCHC 34.1 07/07/2011 1217   RDW 18.4* 09/27/2013 1512   RDW 18.1* 10/04/2011 1750   RDW 15.2 07/07/2011 1217   LYMPHSABS 0.5* 09/27/2013 1512   LYMPHSABS 0.2* 10/04/2011 1750   LYMPHSABS 0.5* 07/07/2011 1217   MONOABS 0.4 09/27/2013 1512   MONOABS 0.9 10/04/2011 1750   EOSABS 0.3 09/27/2013 1512   EOSABS 0.0 10/04/2011 1750   EOSABS 0.0 07/07/2011 1217   BASOSABS 0.1 09/27/2013 1512   BASOSABS 0.0 10/04/2011 1750   BASOSABS 0.0 07/07/2011 1217     Chemistry      Component Value Date/Time   NA 142 09/27/2013 1512   NA 140 08/17/2012 1356   NA 137 07/07/2011 1217   K 3.5 09/27/2013 1512   K 3.7 08/17/2012 1356   K 4.8* 07/07/2011 1217   CL 104 04/19/2013 1300   CL 103 08/17/2012 1356   CL 99 07/07/2011 1217   CO2 21* 09/27/2013 1512   CO2 25 08/17/2012 1356   CO2 29 07/07/2011 1217   BUN 24.0 09/27/2013 1512   BUN 34* 08/17/2012 1356   BUN 41* 07/07/2011 1217   CREATININE 2.6* 09/27/2013 1512   CREATININE 2.11* 08/17/2012 1356   CREATININE 2.1* 07/07/2011 1217   CREATININE 1.90* 03/25/2011 1008      Component Value Date/Time   CALCIUM 7.2* 09/27/2013 1512   CALCIUM 8.4 08/17/2012 1356   CALCIUM 8.7 07/07/2011 1217   ALKPHOS 63 09/27/2013 1512   ALKPHOS 54 08/17/2012 1356   AST 17 09/27/2013 1512   AST 17 08/17/2012 1356   ALT 21 09/27/2013 1512   ALT  31 08/17/2012 1356   BILITOT 0.24 09/27/2013 1512   BILITOT 0.3 08/17/2012 1356       Impression:  #1. Nonsecretory multiple myeloma Overall stable on low dose daily Revlimid. I'm going to repeat a 24-hour urine collection for total protein at this time. Followup bone survey after the holidays.  #2. The idiopathic nephrotic syndrome Wide fluctuations in urine protein. I still do not feel we are dealing with myeloma kidney given the normal serum free light chain ratio. This is more consistent with medical renal disease from his diabetes and hypertension.  #3. Insulin-dependent diabetes  #4. Essential hypertension  #5. Obstructive airway disease   CC: Patient Care Team: Carollee Massed as PCP - General (Unknown Physician Specialty)   Levert Feinstein, MD 11/24/20146:23 PM

## 2013-10-11 ENCOUNTER — Other Ambulatory Visit: Payer: Medicare Other

## 2013-10-17 ENCOUNTER — Other Ambulatory Visit: Payer: Self-pay | Admitting: *Deleted

## 2013-10-17 NOTE — Telephone Encounter (Signed)
THIS REFILL REQUEST FOR REVLIMID WAS PLACED IN DR.GRANFORTUNA'S ACTIVE WORK BOX. 

## 2013-10-18 ENCOUNTER — Other Ambulatory Visit: Payer: Self-pay | Admitting: *Deleted

## 2013-10-18 DIAGNOSIS — C9000 Multiple myeloma not having achieved remission: Secondary | ICD-10-CM

## 2013-10-18 MED ORDER — LENALIDOMIDE 5 MG PO CAPS: 5.0000 mg | ORAL_CAPSULE | Freq: Every day | ORAL | Status: DC

## 2013-10-21 ENCOUNTER — Ambulatory Visit: Payer: Medicare Other

## 2013-10-21 ENCOUNTER — Other Ambulatory Visit: Payer: Self-pay | Admitting: Lab

## 2013-10-21 DIAGNOSIS — N049 Nephrotic syndrome with unspecified morphologic changes: Secondary | ICD-10-CM

## 2013-10-21 DIAGNOSIS — N184 Chronic kidney disease, stage 4 (severe): Secondary | ICD-10-CM

## 2013-10-21 DIAGNOSIS — C9000 Multiple myeloma not having achieved remission: Secondary | ICD-10-CM

## 2013-10-22 NOTE — Telephone Encounter (Signed)
RECEIVED A FAX FROM [facility] CONCERNING A CONFIRMATION OF PRESCRIPTION SHIPMENT FOR REVLIMID ON 10/21/13. 

## 2013-10-25 ENCOUNTER — Encounter (INDEPENDENT_AMBULATORY_CARE_PROVIDER_SITE_OTHER): Payer: Self-pay

## 2013-10-25 ENCOUNTER — Other Ambulatory Visit (HOSPITAL_BASED_OUTPATIENT_CLINIC_OR_DEPARTMENT_OTHER): Payer: Medicare Other

## 2013-10-25 DIAGNOSIS — C9000 Multiple myeloma not having achieved remission: Secondary | ICD-10-CM

## 2013-10-25 DIAGNOSIS — C9001 Multiple myeloma in remission: Secondary | ICD-10-CM

## 2013-10-25 DIAGNOSIS — N184 Chronic kidney disease, stage 4 (severe): Secondary | ICD-10-CM

## 2013-10-25 DIAGNOSIS — N049 Nephrotic syndrome with unspecified morphologic changes: Secondary | ICD-10-CM

## 2013-10-25 LAB — CBC WITH DIFFERENTIAL/PLATELET
Basophils Absolute: 0.1 10*3/uL (ref 0.0–0.1)
EOS%: 7.4 % — ABNORMAL HIGH (ref 0.0–7.0)
HCT: 33.2 % — ABNORMAL LOW (ref 38.4–49.9)
HGB: 11.1 g/dL — ABNORMAL LOW (ref 13.0–17.1)
LYMPH%: 15 % (ref 14.0–49.0)
MCH: 28.8 pg (ref 27.2–33.4)
MCV: 86.1 fL (ref 79.3–98.0)
MONO%: 10.7 % (ref 0.0–14.0)
NEUT%: 64.6 % (ref 39.0–75.0)
Platelets: 114 10*3/uL — ABNORMAL LOW (ref 140–400)
RBC: 3.86 10*6/uL — ABNORMAL LOW (ref 4.20–5.82)
WBC: 3.6 10*3/uL — ABNORMAL LOW (ref 4.0–10.3)

## 2013-10-25 LAB — COMPREHENSIVE METABOLIC PANEL (CC13)
AST: 16 U/L (ref 5–34)
Alkaline Phosphatase: 58 U/L (ref 40–150)
BUN: 32.8 mg/dL — ABNORMAL HIGH (ref 7.0–26.0)
CO2: 24 mEq/L (ref 22–29)
Calcium: 7.6 mg/dL — ABNORMAL LOW (ref 8.4–10.4)
Chloride: 111 mEq/L — ABNORMAL HIGH (ref 98–109)
Creatinine: 2.7 mg/dL — ABNORMAL HIGH (ref 0.7–1.3)
Glucose: 117 mg/dl (ref 70–140)
Sodium: 142 mEq/L (ref 136–145)
Total Bilirubin: 0.3 mg/dL (ref 0.20–1.20)

## 2013-10-25 LAB — UIFE/LIGHT CHAINS/TP QN, 24-HR UR
Albumin, U: DETECTED
Alpha 1, Urine: DETECTED — AB
Alpha 2, Urine: DETECTED — AB
Free Kappa Lt Chains,Ur: 112 mg/dL — ABNORMAL HIGH (ref 0.14–2.42)
Free Kappa/Lambda Ratio: 4.93 ratio (ref 2.04–10.37)
Free Lt Chn Excr Rate: 2128 mg/d
Gamma Globulin, Urine: DETECTED — AB
Total Protein, Urine: 630.5 mg/dL

## 2013-11-14 ENCOUNTER — Other Ambulatory Visit: Payer: Self-pay | Admitting: *Deleted

## 2013-11-14 DIAGNOSIS — C9000 Multiple myeloma not having achieved remission: Secondary | ICD-10-CM

## 2013-11-14 MED ORDER — LENALIDOMIDE 5 MG PO CAPS: 5.0000 mg | ORAL_CAPSULE | Freq: Every day | ORAL | Status: DC

## 2013-11-14 NOTE — Telephone Encounter (Signed)
THIS REFILL REQUEST FOR REVLIMID WAS PLACED IN DR.GRANFORTUNA'S ACTIVE WORK BOX. 

## 2013-11-18 ENCOUNTER — Telehealth: Payer: Self-pay | Admitting: Oncology

## 2013-11-18 NOTE — Telephone Encounter (Signed)
Sent labs report to Dr. Anabel Halon office from Dr. Beryle Beams.

## 2013-11-19 ENCOUNTER — Telehealth: Payer: Self-pay | Admitting: *Deleted

## 2013-11-19 NOTE — Telephone Encounter (Signed)
Pt called requesting re-fill script for Revlimid to be sent to [facility]; "I have only 3 pills left"  Revlimid re-fill was sent 11/14/13; re-faxed to [facility]; called and confirmed that they do have re-fill script request and will process as soon as possible.

## 2013-11-22 NOTE — Telephone Encounter (Signed)
RECEIVED A FAX FROM [facility] CONCERNING A CONFIRMATION OF PRESCRIPTION SHIPMENT FOR REVLIMID ON 11/20/13.

## 2013-11-29 ENCOUNTER — Other Ambulatory Visit (HOSPITAL_BASED_OUTPATIENT_CLINIC_OR_DEPARTMENT_OTHER): Payer: Medicare Other

## 2013-11-29 ENCOUNTER — Ambulatory Visit (HOSPITAL_BASED_OUTPATIENT_CLINIC_OR_DEPARTMENT_OTHER): Payer: Medicare Other | Admitting: Nurse Practitioner

## 2013-11-29 ENCOUNTER — Encounter (INDEPENDENT_AMBULATORY_CARE_PROVIDER_SITE_OTHER): Payer: Self-pay

## 2013-11-29 ENCOUNTER — Ambulatory Visit (HOSPITAL_COMMUNITY)
Admission: RE | Admit: 2013-11-29 | Discharge: 2013-11-29 | Disposition: A | Discharge status: Home / Self Care | Payer: Medicare Other | Source: Ambulatory Visit | Attending: Oncology | Admitting: Oncology

## 2013-11-29 VITALS — BP 170/70 | HR 53 | Temp 98.2°F | Resp 21 | Ht 66.0 in | Wt 250.9 lb

## 2013-11-29 DIAGNOSIS — N184 Chronic kidney disease, stage 4 (severe): Secondary | ICD-10-CM

## 2013-11-29 DIAGNOSIS — Z981 Arthrodesis status: Secondary | ICD-10-CM | POA: Insufficient documentation

## 2013-11-29 DIAGNOSIS — M948X9 Other specified disorders of cartilage, unspecified sites: Secondary | ICD-10-CM | POA: Insufficient documentation

## 2013-11-29 DIAGNOSIS — C9000 Multiple myeloma not having achieved remission: Secondary | ICD-10-CM

## 2013-11-29 DIAGNOSIS — M549 Dorsalgia, unspecified: Secondary | ICD-10-CM

## 2013-11-29 DIAGNOSIS — I10 Essential (primary) hypertension: Secondary | ICD-10-CM

## 2013-11-29 DIAGNOSIS — E119 Type 2 diabetes mellitus without complications: Secondary | ICD-10-CM

## 2013-11-29 DIAGNOSIS — G8929 Other chronic pain: Secondary | ICD-10-CM

## 2013-11-29 DIAGNOSIS — N049 Nephrotic syndrome with unspecified morphologic changes: Secondary | ICD-10-CM

## 2013-11-29 LAB — CBC WITH DIFFERENTIAL/PLATELET
BASO%: 3.4 % — AB (ref 0.0–2.0)
Basophils Absolute: 0.1 10*3/uL (ref 0.0–0.1)
EOS%: 9.6 % — AB (ref 0.0–7.0)
Eosinophils Absolute: 0.3 10*3/uL (ref 0.0–0.5)
HEMATOCRIT: 32.1 % — AB (ref 38.4–49.9)
HGB: 10.7 g/dL — ABNORMAL LOW (ref 13.0–17.1)
LYMPH#: 0.5 10*3/uL — AB (ref 0.9–3.3)
LYMPH%: 16.2 % (ref 14.0–49.0)
MCH: 27.9 pg (ref 27.2–33.4)
MCHC: 33.3 g/dL (ref 32.0–36.0)
MCV: 83.6 fL (ref 79.3–98.0)
MONO#: 0.4 10*3/uL (ref 0.1–0.9)
MONO%: 12.4 % (ref 0.0–14.0)
NEUT#: 1.7 10*3/uL (ref 1.5–6.5)
NEUT%: 58.4 % (ref 39.0–75.0)
Platelets: 95 10*3/uL — ABNORMAL LOW (ref 140–400)
RBC: 3.84 10*6/uL — AB (ref 4.20–5.82)
RDW: 16.1 % — ABNORMAL HIGH (ref 11.0–14.6)
WBC: 2.9 10*3/uL — ABNORMAL LOW (ref 4.0–10.3)
nRBC: 0 % (ref 0–0)

## 2013-11-29 LAB — COMPREHENSIVE METABOLIC PANEL (CC13)
ALBUMIN: 2.2 g/dL — AB (ref 3.5–5.0)
ALK PHOS: 56 U/L (ref 40–150)
ALT: 15 U/L (ref 0–55)
AST: 11 U/L (ref 5–34)
Anion Gap: 8 mEq/L (ref 3–11)
BUN: 38.1 mg/dL — ABNORMAL HIGH (ref 7.0–26.0)
CO2: 23 mEq/L (ref 22–29)
CREATININE: 3.2 mg/dL — AB (ref 0.7–1.3)
Calcium: 7.8 mg/dL — ABNORMAL LOW (ref 8.4–10.4)
Chloride: 112 mEq/L — ABNORMAL HIGH (ref 98–109)
Glucose: 90 mg/dl (ref 70–140)
POTASSIUM: 3.6 meq/L (ref 3.5–5.1)
Sodium: 143 mEq/L (ref 136–145)
Total Bilirubin: 0.29 mg/dL (ref 0.20–1.20)
Total Protein: 6.2 g/dL — ABNORMAL LOW (ref 6.4–8.3)

## 2013-11-29 NOTE — Progress Notes (Signed)
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 Initial diagnosis dates to October 2007 when he presented with severe back pain due to pathologic fractures of the spine. He was initially treated with palliative radiation. He subsequently underwent surgery. This was followed by Revlimid plus dexamethasone for 6 months. Dexamethasone was discontinued due to uncontrolled diabetes and infection. He was found to have a new lytic lesion of the left iliac bone in March 2012. He received a course of palliative radiation. He was started back on chemotherapy with subcutaneous Velcade plus oral melphalan August 2012 with a partial response. Most recently he was put back on treatment with single agent Revlimid maintenance 10 mg daily August 2013. The Revlimid dose was decreased to 5 mg daily following an office visit 02/08/2013 due to a decline in renal function.  Most recent serum light chain analysis on 09/27/2013 was stable. 24-hour urine on 10/21/2013 showed an increase in the total protein from 6.8 g on 06/21/2013 to 11.9 g.  He is seen today for scheduled followup. He continues Revlimid 5 mg daily. He has periodic mild nausea. He has occasional vomiting. No diarrhea or constipation. Good appetite. Stable chronic back pain. No new areas of pain. He has mild numbness in the fingertips. He notes his blood pressure is elevated at today's visit. Blood pressure has been elevated at home as well.   Objective: Filed Vitals:   11/29/13 1430  BP: 170/70  Pulse:   Temp:   Resp:    Oropharynx is without thrush or ulceration. No palpable cervical, supraclavicular or axillary lymph nodes. Lungs are clear. No wheezes or rales. Regular cardiac rhythm. No murmur. Abdomen is soft and nontender. No organomegaly. Chronic lower leg edema bilaterally right slightly greater than left. Motor strength 5 over  5. Knee DTRs absent symmetrically.   Lab Results: Lab Results  Component Value Date   WBC 2.9* 11/29/2013   HGB 10.7* 11/29/2013   HCT 32.1* 11/29/2013   MCV 83.6 11/29/2013   PLT 95* 11/29/2013   NEUTROABS 1.7 11/29/2013    Chemistry:    Chemistry      Component Value Date/Time   NA 142 10/25/2013 1439   NA 140 08/17/2012 1356   NA 137 07/07/2011 1217   K 3.7 10/25/2013 1439   K 3.7 08/17/2012 1356   K 4.8* 07/07/2011 1217   CL 104 04/19/2013 1300   CL 103 08/17/2012 1356   CL 99 07/07/2011 1217   CO2 24 10/25/2013 1439   CO2 25 08/17/2012 1356   CO2 29 07/07/2011 1217   BUN 32.8* 10/25/2013 1439   BUN 34* 08/17/2012 1356   BUN 41* 07/07/2011 1217   CREATININE 2.7* 10/25/2013 1439   CREATININE 2.11* 08/17/2012 1356   CREATININE 2.1* 07/07/2011 1217   CREATININE 1.90* 03/25/2011 1008      Component Value Date/Time   CALCIUM 7.6* 10/25/2013 1439   CALCIUM 8.4 08/17/2012 1356   CALCIUM 8.7 07/07/2011 1217   ALKPHOS 58 10/25/2013 1439   ALKPHOS 54 08/17/2012 1356   AST 16 10/25/2013 1439   AST 17 08/17/2012 1356   ALT 17 10/25/2013 1439   ALT 31 08/17/2012 1356   BILITOT 0.30 10/25/2013 1439   BILITOT 0.3 08/17/2012 1356       Studies/Results: No results found.  Medications: I have reviewed the patient's current medications.  Assessment/Plan: 1. Nonsecretory versus kappa light chain myeloma currently  on Revlimid 5 mg daily. Myeloma labs 09/27/2013 stable. 2. Idiopathic nephrotic syndrome. 3. Obstructive airway disease. 4. Insulin-dependent diabetes. 5. Essential hypertension.  Dispositon-he will continue Revlimid 5 mg daily. We will followup on the bone survey from today. He will return for a followup visit with Dr. Beryle Beams in approximately 6 weeks. He knows to contact the office in the interim with any problems.  He plans to contact his nephrologist regarding the elevated blood pressure.  Plan reviewed with Dr. Beryle Beams.   Ned Card ANP/GNP-BC

## 2013-12-03 ENCOUNTER — Telehealth: Payer: Self-pay | Admitting: *Deleted

## 2013-12-03 NOTE — Telephone Encounter (Signed)
Message copied by Domenic Schwab on Tue Dec 03, 2013 12:44 PM ------      Message from: Annia Belt      Created: Fri Nov 29, 2013  6:51 PM       Call pt: bone X-rays good.  No new myeloma spots ------

## 2013-12-03 NOTE — Telephone Encounter (Signed)
Notified pt per Dr. Beryle Beams that bone X-rays good; no new myeloma spots.  Pt verbalized understanding and expressed appreciation for call.

## 2013-12-12 ENCOUNTER — Other Ambulatory Visit: Payer: Self-pay | Admitting: *Deleted

## 2013-12-12 ENCOUNTER — Telehealth: Payer: Self-pay | Admitting: Oncology

## 2013-12-12 DIAGNOSIS — C9000 Multiple myeloma not having achieved remission: Secondary | ICD-10-CM

## 2013-12-12 MED ORDER — LENALIDOMIDE 5 MG PO CAPS: 5.0000 mg | ORAL_CAPSULE | Freq: Every day | ORAL | Status: DC

## 2013-12-12 NOTE — Telephone Encounter (Signed)
THIS REFILL REQUEST FOR REVLIMID WAS PLACED IN DR.GRANFORTUNA'S ACTIVE WORK BOX.

## 2013-12-12 NOTE — Telephone Encounter (Signed)
Sent letter to  Dr.Stovall office from Dr. Beryle Beams.

## 2013-12-17 NOTE — Telephone Encounter (Signed)
RECEIVED A FAX FROM [facility]CERNING A CONFIRMATION OF PRESCRIPTION SHIPMENT FOR REVLIMID ON 12/16/13.

## 2013-12-27 ENCOUNTER — Other Ambulatory Visit: Payer: Medicare Other

## 2013-12-27 LAB — CBC WITH DIFFERENTIAL/PLATELET
BASO%: 4.6 % — ABNORMAL HIGH (ref 0.0–2.0)
Basophils Absolute: 0.1 10*3/uL (ref 0.0–0.1)
EOS%: 9.4 % — AB (ref 0.0–7.0)
Eosinophils Absolute: 0.3 10*3/uL (ref 0.0–0.5)
HCT: 33.6 % — ABNORMAL LOW (ref 38.4–49.9)
HGB: 11.1 g/dL — ABNORMAL LOW (ref 13.0–17.1)
LYMPH%: 15.6 % (ref 14.0–49.0)
MCH: 28.5 pg (ref 27.2–33.4)
MCHC: 33 g/dL (ref 32.0–36.0)
MCV: 86.4 fL (ref 79.3–98.0)
MONO#: 0.3 10*3/uL (ref 0.1–0.9)
MONO%: 11.1 % (ref 0.0–14.0)
NEUT#: 1.6 10*3/uL (ref 1.5–6.5)
NEUT%: 59.3 % (ref 39.0–75.0)
Platelets: 125 10*3/uL — ABNORMAL LOW (ref 140–400)
RBC: 3.88 10*6/uL — AB (ref 4.20–5.82)
RDW: 17.6 % — AB (ref 11.0–14.6)
WBC: 2.7 10*3/uL — ABNORMAL LOW (ref 4.0–10.3)
lymph#: 0.4 10*3/uL — ABNORMAL LOW (ref 0.9–3.3)

## 2013-12-27 LAB — COMPREHENSIVE METABOLIC PANEL (CC13)
ALBUMIN: 2.2 g/dL — AB (ref 3.5–5.0)
ALT: 14 U/L (ref 0–55)
ANION GAP: 7 meq/L (ref 3–11)
AST: 13 U/L (ref 5–34)
Alkaline Phosphatase: 62 U/L (ref 40–150)
BUN: 36 mg/dL — AB (ref 7.0–26.0)
CHLORIDE: 112 meq/L — AB (ref 98–109)
CO2: 23 meq/L (ref 22–29)
CREATININE: 3.6 mg/dL — AB (ref 0.7–1.3)
Calcium: 7.7 mg/dL — ABNORMAL LOW (ref 8.4–10.4)
Glucose: 105 mg/dl (ref 70–140)
POTASSIUM: 3.5 meq/L (ref 3.5–5.1)
Sodium: 142 mEq/L (ref 136–145)
Total Bilirubin: 0.35 mg/dL (ref 0.20–1.20)
Total Protein: 6.3 g/dL — ABNORMAL LOW (ref 6.4–8.3)

## 2013-12-30 ENCOUNTER — Telehealth: Payer: Self-pay | Admitting: *Deleted

## 2013-12-30 NOTE — Telephone Encounter (Signed)
Message copied by Ignacia Felling on Mon Dec 30, 2013  9:48 AM ------      Message from: Annia Belt      Created: Fri Dec 27, 2013  4:16 PM       Call pt: kidney function getting worse.  Stop Revlimid for now.  We will send report to Dr Brayton El, his Nephrologis ------

## 2013-12-30 NOTE — Telephone Encounter (Signed)
Spoke with Jackie(wife) as patient is still in bed.   Let her know that Ankur's kidney function is getting worse.  He is to stop his revlimid for now and we will send report to Dr. Brayton El, his nephrologist.  Report sent to nephrologist.

## 2013-12-31 LAB — KAPPA/LAMBDA LIGHT CHAINS
KAPPA FREE LGHT CHN: 32.8 mg/dL — AB (ref 0.33–1.94)
Kappa:Lambda Ratio: 1.66 — ABNORMAL HIGH (ref 0.26–1.65)
LAMBDA FREE LGHT CHN: 19.8 mg/dL — AB (ref 0.57–2.63)

## 2013-12-31 LAB — IGG, IGA, IGM
IgA: 735 mg/dL — ABNORMAL HIGH (ref 68–379)
IgG (Immunoglobin G), Serum: 1460 mg/dL (ref 650–1600)
IgM, Serum: 18 mg/dL — ABNORMAL LOW (ref 41–251)

## 2014-01-04 ENCOUNTER — Encounter: Payer: Self-pay | Admitting: Hematology & Oncology

## 2014-01-04 ENCOUNTER — Telehealth: Payer: Self-pay | Admitting: Hematology & Oncology

## 2014-01-09 ENCOUNTER — Telehealth: Payer: Self-pay | Admitting: *Deleted

## 2014-01-09 ENCOUNTER — Other Ambulatory Visit: Payer: Self-pay | Admitting: *Deleted

## 2014-01-09 ENCOUNTER — Telehealth: Payer: Self-pay | Admitting: Oncology

## 2014-01-09 DIAGNOSIS — N184 Chronic kidney disease, stage 4 (severe): Secondary | ICD-10-CM

## 2014-01-09 NOTE — Telephone Encounter (Signed)
Notified pt's wife that labs ordered for tomorrows visit & to be here @ 2:30 pm.  Also discussed revlimid & reminders & wife reports that Dr Beryle Beams had pt to stop due to elevated creat.  Notified [facility] to hold script for now.

## 2014-01-09 NOTE — Telephone Encounter (Signed)
THIS REFILL REQUEST FOR REVLIMID WAS PLACED IN DR.GRANFORTUNA'S ACTIVE WORK BOX. 

## 2014-01-09 NOTE — Telephone Encounter (Signed)
Scheduled the pt's lab appt prior to the md appt

## 2014-01-10 ENCOUNTER — Telehealth: Payer: Self-pay | Admitting: Hematology & Oncology

## 2014-01-10 ENCOUNTER — Other Ambulatory Visit (HOSPITAL_BASED_OUTPATIENT_CLINIC_OR_DEPARTMENT_OTHER): Payer: Medicare Other

## 2014-01-10 ENCOUNTER — Encounter (INDEPENDENT_AMBULATORY_CARE_PROVIDER_SITE_OTHER): Payer: Self-pay

## 2014-01-10 ENCOUNTER — Ambulatory Visit (HOSPITAL_BASED_OUTPATIENT_CLINIC_OR_DEPARTMENT_OTHER): Payer: Medicare Other | Admitting: Oncology

## 2014-01-10 VITALS — BP 193/73 | HR 67 | Temp 97.6°F | Resp 17 | Ht 66.0 in | Wt 248.5 lb

## 2014-01-10 DIAGNOSIS — J449 Chronic obstructive pulmonary disease, unspecified: Secondary | ICD-10-CM

## 2014-01-10 DIAGNOSIS — E119 Type 2 diabetes mellitus without complications: Secondary | ICD-10-CM

## 2014-01-10 DIAGNOSIS — C9001 Multiple myeloma in remission: Secondary | ICD-10-CM

## 2014-01-10 DIAGNOSIS — N049 Nephrotic syndrome with unspecified morphologic changes: Secondary | ICD-10-CM

## 2014-01-10 DIAGNOSIS — C9000 Multiple myeloma not having achieved remission: Secondary | ICD-10-CM

## 2014-01-10 DIAGNOSIS — I10 Essential (primary) hypertension: Secondary | ICD-10-CM

## 2014-01-10 DIAGNOSIS — N184 Chronic kidney disease, stage 4 (severe): Secondary | ICD-10-CM

## 2014-01-10 DIAGNOSIS — C9002 Multiple myeloma in relapse: Secondary | ICD-10-CM

## 2014-01-10 LAB — CBC WITH DIFFERENTIAL/PLATELET
BASO%: 3.5 % — AB (ref 0.0–2.0)
Basophils Absolute: 0.1 10*3/uL (ref 0.0–0.1)
EOS ABS: 0.1 10*3/uL (ref 0.0–0.5)
EOS%: 3.6 % (ref 0.0–7.0)
HCT: 32.3 % — ABNORMAL LOW (ref 38.4–49.9)
HGB: 10.7 g/dL — ABNORMAL LOW (ref 13.0–17.1)
LYMPH%: 14.8 % (ref 14.0–49.0)
MCH: 28.3 pg (ref 27.2–33.4)
MCHC: 33.2 g/dL (ref 32.0–36.0)
MCV: 85.4 fL (ref 79.3–98.0)
MONO#: 0.4 10*3/uL (ref 0.1–0.9)
MONO%: 11.3 % (ref 0.0–14.0)
NEUT%: 66.8 % (ref 39.0–75.0)
NEUTROS ABS: 2.3 10*3/uL (ref 1.5–6.5)
Platelets: 123 10*3/uL — ABNORMAL LOW (ref 140–400)
RBC: 3.78 10*6/uL — ABNORMAL LOW (ref 4.20–5.82)
RDW: 17.3 % — AB (ref 11.0–14.6)
WBC: 3.4 10*3/uL — ABNORMAL LOW (ref 4.0–10.3)
lymph#: 0.5 10*3/uL — ABNORMAL LOW (ref 0.9–3.3)

## 2014-01-10 LAB — COMPREHENSIVE METABOLIC PANEL (CC13)
ALT: 12 U/L (ref 0–55)
AST: 13 U/L (ref 5–34)
Albumin: 2.2 g/dL — ABNORMAL LOW (ref 3.5–5.0)
Alkaline Phosphatase: 65 U/L (ref 40–150)
Anion Gap: 8 mEq/L (ref 3–11)
BUN: 40.3 mg/dL — ABNORMAL HIGH (ref 7.0–26.0)
CO2: 20 mEq/L — ABNORMAL LOW (ref 22–29)
Calcium: 8.1 mg/dL — ABNORMAL LOW (ref 8.4–10.4)
Chloride: 117 mEq/L — ABNORMAL HIGH (ref 98–109)
Creatinine: 3.6 mg/dL (ref 0.7–1.3)
GLUCOSE: 81 mg/dL (ref 70–140)
Potassium: 3.7 mEq/L (ref 3.5–5.1)
Sodium: 145 mEq/L (ref 136–145)
TOTAL PROTEIN: 6.6 g/dL (ref 6.4–8.3)
Total Bilirubin: 0.23 mg/dL (ref 0.20–1.20)

## 2014-01-10 NOTE — Telephone Encounter (Signed)
Received message from Tendra need to schedule appointment. I left her message to call I can't see their POF's. Melissa is aware to have schedulers call me.

## 2014-01-12 NOTE — Progress Notes (Signed)
Hematology and Oncology Follow Up Visit  William Gaines [id number] 12/21/1940 73 y.o. 01/12/2014 3:02 PM   Principle Diagnosis: Encounter Diagnoses  Name Primary?  . Multiple myeloma, in relapse Yes  . Nephrotic syndrome   . Chronic renal insufficiency, stage IV (severe)      Interim History:   Followup visit for this 73 year old man with non-secretory versus kappa light chain multiple myeloma initially diagnosed in October 2007 when he presented with severe back pain due to pathologic fractures of his spine. Initial treatment with palliative radiation. Subsequent surgical stabilization procedure. He went on to receive immunotherapy initially with Revlimid plus dexamethasone for 6 months. Subsequent discontinuation of dexamethasone due to uncontrolled diabetes and infection. He was followed until a new lytic lesion of his left iliac bone developed in March 2012. He received a course of palliative radiation. He was started back on chemotherapy with a combination of subcutaneous Velcade plus oral melphalan in August 2012. He achieved a nice partial response. Most recently he was put back on single agent Revlimid maintenance initially 10 mg daily beginning in August 2013. He has tolerated this well. I elected to decrease his dose to 5 mg daily in view of his chronic renal insufficiency. Recently, his creatinine levels have  slowly increased and there has been a progressive rise in proteinuria with most recent 24-hour urine done on 10/21/2013 now showing almost 12 g of protein up from 6.8 g in August, 2 g in May, and 1 g in March 2014. I put his Revlimid on hold on approximately February 20 ,2015 when creatinine rose to 3.6. He continues to have a normal kappa to  lambda serum free light chain ratio again suggesting that his renal dysfunction is not due to myeloma but to medical renal disease.   Ratio 1.66 on February 20.  He reports no new areas of pain. He still gets some pain in his back if he  overdoes it. He has had no interval infections.  Unfortunately his wife just developed a second primary non-small cell lung cancer in the left lung. She already has oxygen-dependent obstructive airway disease. She was referred for another course of radiation now 3 years status post her initial cancer in the contralateral lung.  Medications: reviewed  Allergies:  Allergies  Allergen Reactions  . Codeine     hallucinations  . Lorazepam [Lorazepam] Anxiety    Paradoxical agitation when given as pre med for bone marrow biopsy 09/11/06    Review of Systems: Hematology:  No bleeding or bruising ENT ROS: No sore throat Breast ROS:  Respiratory ROS no cough or dyspnea:  Cardiovascular ROS: No chest pain or palpitations  Gastrointestinal ROS: No abdominal pain or change in bowel habit    Genito-Urinary ROS: No urinary tract symptoms Musculoskeletal ROS: No change in chronic back pain Neurological ROS: No headache or change in vision Dermatological ROS: No rash Remaining ROS negative:   Physical Exam: Blood pressure 193/73, pulse 67, temperature 97.6 F (36.4 C), temperature source Oral, resp. rate 17, height $RemoveBe'5\' 6"'ZnmkXTqxF$  (1.676 m), weight 248 lb 8 oz (112.719 kg), SpO2 99.00%. Wt Readings from Last 3 Encounters:  01/10/14 248 lb 8 oz (112.719 kg)  11/29/13 250 lb 14.4 oz (113.807 kg)  09/27/13 255 lb 8 oz (115.894 kg)     General appearance: Well-nourished Caucasian man HENNT: Pharynx no erythema, exudate, mass, or ulcer. No thyromegaly or thyroid nodules Lymph nodes: No cervical, supraclavicular, or axillary lymphadenopathy Breasts:  Lungs: Clear to auscultation, resonant to  percussion throughout Heart: Regular rhythm, no murmur, no gallop, no rub, no click, no edema Abdomen: Soft, nontender, normal bowel sounds, no mass, no organomegaly. Ventral hernia. Extremities: Chronic 2+ edema and associated venous stasis changes, no calf tenderness Musculoskeletal: no joint deformities GU:   Vascular: Carotid pulses 2+, no bruits,  Neurologic: Alert, oriented, PERRLA,   cranial nerves grossly normal, motor strength 5 over 5, reflexes 1+ symmetric, upper body coordination normal, gait normal, sensation intact to vibration over the fingertips by tuning fork exam. Skin: No rash or ecchymosis  Lab Results: CBC W/Diff    Component Value Date/Time   WBC 3.4* 01/10/2014 1436   WBC 5.2 10/04/2011 1750   WBC 3.1* 07/07/2011 1217   RBC 3.78* 01/10/2014 1436   RBC 3.03* 10/04/2011 1750   HGB 10.7* 01/10/2014 1436   HGB 9.7* 10/04/2011 1750   HGB 10.0* 07/07/2011 1217   HCT 32.3* 01/10/2014 1436   HCT 28.7* 10/04/2011 1750   HCT 29.3* 07/07/2011 1217   PLT 123* 01/10/2014 1436   PLT 67* 10/04/2011 1750   PLT 195 07/07/2011 1217   MCV 85.4 01/10/2014 1436   MCV 94.7 10/04/2011 1750   MCV 88 07/07/2011 1217   MCH 28.3 01/10/2014 1436   MCH 32.0 10/04/2011 1750   MCH 29.9 07/07/2011 1217   MCHC 33.2 01/10/2014 1436   MCHC 33.8 10/04/2011 1750   MCHC 34.1 07/07/2011 1217   RDW 17.3* 01/10/2014 1436   RDW 18.1* 10/04/2011 1750   RDW 15.2 07/07/2011 1217   LYMPHSABS 0.5* 01/10/2014 1436   LYMPHSABS 0.2* 10/04/2011 1750   LYMPHSABS 0.5* 07/07/2011 1217   MONOABS 0.4 01/10/2014 1436   MONOABS 0.9 10/04/2011 1750   EOSABS 0.1 01/10/2014 1436   EOSABS 0.0 10/04/2011 1750   EOSABS 0.0 07/07/2011 1217   BASOSABS 0.1 01/10/2014 1436   BASOSABS 0.0 10/04/2011 1750   BASOSABS 0.0 07/07/2011 1217     Chemistry      Component Value Date/Time   NA 145 01/10/2014 1436   NA 140 08/17/2012 1356   NA 137 07/07/2011 1217   K 3.7 01/10/2014 1436   K 3.7 08/17/2012 1356   K 4.8* 07/07/2011 1217   CL 104 04/19/2013 1300   CL 103 08/17/2012 1356   CL 99 07/07/2011 1217   CO2 20* 01/10/2014 1436   CO2 25 08/17/2012 1356   CO2 29 07/07/2011 1217   BUN 40.3* 01/10/2014 1436   BUN 34* 08/17/2012 1356   BUN 41* 07/07/2011 1217   CREATININE 3.6* 01/10/2014 1436   CREATININE 2.11* 08/17/2012 1356   CREATININE 2.1* 07/07/2011 1217    CREATININE 1.90* 03/25/2011 1008      Component Value Date/Time   CALCIUM 8.1* 01/10/2014 1436   CALCIUM 8.4 08/17/2012 1356   CALCIUM 8.7 07/07/2011 1217   ALKPHOS 65 01/10/2014 1436   ALKPHOS 54 08/17/2012 1356   AST 13 01/10/2014 1436   AST 17 08/17/2012 1356   ALT 12 01/10/2014 1436   ALT 31 08/17/2012 1356   BILITOT 0.23 01/10/2014 1436   BILITOT 0.3 08/17/2012 1356       Radiological Studies: Most recent bone survey done 11/29/2013 was stable with no new lesions.   Impression:   #1. Nonsecretory multiple myeloma now out over 7 years from diagnosis in October 2007. Holding Revlimid as of February 2015 in view of progressive renal dysfunction  continue quarterly Zometa. I'm going to transition his care to Dr. Marin Olp. I would consider a Cytoxan or Bendamustine -based  regimen which would also be active to treat the nephrotic syndrome.  #2. Idiopathic nephrotic syndrome  Wide fluctuations in urine protein.  I still do not feel we are dealing with myeloma kidney given the normal serum free light chain ratio. This is more consistent with medical renal disease from his diabetes and hypertension.   #3. Insulin-dependent diabetes   #4. Essential hypertension   #5. Obstructive airway disease   CC: Patient Care Team: Cristine Polio as PCP - General (Unknown Physician Specialty)   Annia Belt, MD 3/8/20153:02 PM

## 2014-01-13 ENCOUNTER — Telehealth: Payer: Self-pay | Admitting: Hematology & Oncology

## 2014-01-13 ENCOUNTER — Telehealth: Payer: Self-pay | Admitting: *Deleted

## 2014-01-13 LAB — KAPPA/LAMBDA LIGHT CHAINS
KAPPA FREE LGHT CHN: 24.4 mg/dL — AB (ref 0.33–1.94)
Kappa:Lambda Ratio: 1.71 — ABNORMAL HIGH (ref 0.26–1.65)
Lambda Free Lght Chn: 14.3 mg/dL — ABNORMAL HIGH (ref 0.57–2.63)

## 2014-01-13 LAB — IGG: IgG (Immunoglobin G), Serum: 1780 mg/dL — ABNORMAL HIGH (ref 650–1600)

## 2014-01-13 NOTE — Telephone Encounter (Signed)
sw pt gv appts for 01/16/14@1 :30pm. Also gv appt for 02/19/14 w/ labs@ 1:30pm and to see Dr. Marin Olp @ 2pm. Pt is aware...td

## 2014-01-13 NOTE — Telephone Encounter (Signed)
Kendra scheduled 3-12 and 4-15 appointments for pt, she was going to call him with schedule. While I was making this note pt called and is aware of 3-12 and 4-15 appointments. He cx 3-12 zometa said he didn't need it. I left voice mail with referring RN that pt is refusing zometa.

## 2014-01-14 ENCOUNTER — Telehealth: Payer: Self-pay | Admitting: *Deleted

## 2014-01-14 NOTE — Telephone Encounter (Signed)
Talked with pt's wife since pt not at home & informed to cont hold revlimid although creatinine not increased further.  Pt had seen these results on My Chart.  Labs had already been routed to Dr Genevie Ann.

## 2014-01-14 NOTE — Telephone Encounter (Signed)
Message copied by Jesse Fall on Tue Jan 14, 2014  4:13 PM ------      Message from: Annia Belt      Created: Mon Jan 13, 2014  6:46 PM       Call pt: creatinine has not increased any further. I am going to continue to hold the revlimid until he sees Dr Marin Olp.  Cc lab to Dr Moses Manners, Nephrology [facility] ------

## 2014-01-16 ENCOUNTER — Ambulatory Visit: Payer: Medicare Other

## 2014-02-19 ENCOUNTER — Ambulatory Visit: Payer: Medicare Other | Admitting: Hematology & Oncology

## 2014-02-19 ENCOUNTER — Ambulatory Visit (HOSPITAL_BASED_OUTPATIENT_CLINIC_OR_DEPARTMENT_OTHER): Payer: Medicare Other | Admitting: Hematology & Oncology

## 2014-02-19 ENCOUNTER — Encounter: Payer: Self-pay | Admitting: Hematology & Oncology

## 2014-02-19 ENCOUNTER — Other Ambulatory Visit (HOSPITAL_BASED_OUTPATIENT_CLINIC_OR_DEPARTMENT_OTHER): Payer: Medicare Other | Admitting: Lab

## 2014-02-19 VITALS — BP 164/63 | HR 55 | Temp 97.7°F | Resp 18 | Ht 68.0 in | Wt 251.0 lb

## 2014-02-19 DIAGNOSIS — N049 Nephrotic syndrome with unspecified morphologic changes: Secondary | ICD-10-CM

## 2014-02-19 DIAGNOSIS — N184 Chronic kidney disease, stage 4 (severe): Secondary | ICD-10-CM

## 2014-02-19 DIAGNOSIS — N189 Chronic kidney disease, unspecified: Secondary | ICD-10-CM

## 2014-02-19 DIAGNOSIS — C9002 Multiple myeloma in relapse: Secondary | ICD-10-CM

## 2014-02-19 DIAGNOSIS — C9001 Multiple myeloma in remission: Secondary | ICD-10-CM

## 2014-02-19 DIAGNOSIS — C9000 Multiple myeloma not having achieved remission: Secondary | ICD-10-CM

## 2014-02-19 LAB — CBC WITH DIFFERENTIAL (CANCER CENTER ONLY)
BASO#: 0 10*3/uL (ref 0.0–0.2)
BASO%: 0.7 % (ref 0.0–2.0)
EOS ABS: 0.1 10*3/uL (ref 0.0–0.5)
EOS%: 1.8 % (ref 0.0–7.0)
HCT: 31.7 % — ABNORMAL LOW (ref 38.7–49.9)
HEMOGLOBIN: 11 g/dL — AB (ref 13.0–17.1)
LYMPH#: 0.5 10*3/uL — AB (ref 0.9–3.3)
LYMPH%: 11.9 % — ABNORMAL LOW (ref 14.0–48.0)
MCH: 30.3 pg (ref 28.0–33.4)
MCHC: 34.7 g/dL (ref 32.0–35.9)
MCV: 87 fL (ref 82–98)
MONO#: 0.5 10*3/uL (ref 0.1–0.9)
MONO%: 10.1 % (ref 0.0–13.0)
NEUT%: 75.5 % (ref 40.0–80.0)
NEUTROS ABS: 3.4 10*3/uL (ref 1.5–6.5)
Platelets: 130 10*3/uL — ABNORMAL LOW (ref 145–400)
RBC: 3.63 10*6/uL — ABNORMAL LOW (ref 4.20–5.70)
RDW: 16.9 % — ABNORMAL HIGH (ref 11.1–15.7)
WBC: 4.5 10*3/uL (ref 4.0–10.0)

## 2014-02-20 LAB — COMPREHENSIVE METABOLIC PANEL
ALBUMIN: 2.5 g/dL — AB (ref 3.5–5.2)
ALT: 12 U/L (ref 0–53)
AST: 13 U/L (ref 0–37)
Alkaline Phosphatase: 60 U/L (ref 39–117)
BUN: 35 mg/dL — ABNORMAL HIGH (ref 6–23)
CALCIUM: 7.6 mg/dL — AB (ref 8.4–10.5)
CHLORIDE: 112 meq/L (ref 96–112)
CO2: 22 mEq/L (ref 19–32)
Creatinine, Ser: 3.81 mg/dL — ABNORMAL HIGH (ref 0.50–1.35)
GLUCOSE: 74 mg/dL (ref 70–99)
POTASSIUM: 3.7 meq/L (ref 3.5–5.3)
Sodium: 141 mEq/L (ref 135–145)
TOTAL PROTEIN: 5.9 g/dL — AB (ref 6.0–8.3)
Total Bilirubin: 0.3 mg/dL (ref 0.2–1.2)

## 2014-02-20 LAB — KAPPA/LAMBDA LIGHT CHAINS
KAPPA LAMBDA RATIO: 2.76 — AB (ref 0.26–1.65)
Kappa free light chain: 27.2 mg/dL — ABNORMAL HIGH (ref 0.33–1.94)
LAMBDA FREE LGHT CHN: 9.87 mg/dL — AB (ref 0.57–2.63)

## 2014-02-22 NOTE — Progress Notes (Signed)
Hematology and Oncology Follow Up Visit  William Gaines [id number] 12/31/1940 73 y.o. 02/22/2014   Principle Diagnosis:   Relapsed Kappa light chain myeloma  Nephrotic syndrome secondary to chronic renal insufficiency  Current Therapy:    Observation     Interim History:  [name] is in for first office visit with me. He was followed at the main [facility] by Dr. Beryle Gaines. He initially was diagnosed with Kappa light chain myeloma back in October 2007. He's been on therapy. He's not a transplant candidate. He recently was on Revlimid. His renal function has been worsening. This has not been from myeloma. He does see nephrology.  He had been on Revlimid and this is on hold right now.  He feels fairly well. Again he has other comorbid conditions.  He does have chronic leg swelling. He's not having a lot of pain. His appetite has been okay. He has not had nausea vomiting. There's no bleeding. There is no fever sweats or chills.  Medications: Current outpatient prescriptions:acyclovir (ZOVIRAX) 400 MG tablet, Take 400 mg by mouth daily., Disp: , Rfl: ;  allopurinol (ZYLOPRIM) 300 MG tablet, Take 150 mg by mouth daily. , Disp: , Rfl: ;  aspirin 81 MG tablet, Take 81 mg by mouth daily., Disp: , Rfl: ;  doxazosin (CARDURA) 8 MG tablet, Take 8 mg by mouth at bedtime.  , Disp: , Rfl: ;  furosemide (LASIX) 40 MG tablet, Take 40 mg by mouth every other day. , Disp: , Rfl:  glipiZIDE (GLUCOTROL) 5 MG tablet, Take 5 mg by mouth daily.  , Disp: , Rfl: ;  insulin glargine (LANTUS) 100 UNIT/ML injection, Inject 50 Units into the skin daily.  , Disp: , Rfl: ;  lisinopril (PRINIVIL,ZESTRIL) 40 MG tablet, Take 40 mg by mouth daily., Disp: , Rfl: ;  loratadine (CLARITIN) 10 MG tablet, Take 10 mg by mouth daily., Disp: , Rfl: ;  metoprolol (LOPRESSOR) 50 MG tablet, Take 50 mg by mouth 2 (two) times daily.  , Disp: , Rfl:  ondansetron (ZOFRAN) 8 MG tablet, TAKE ONE TABLET BY MOUTH BEFORE CHEMO, THEN EVERY  8 HOURS AS NEEDED FOR NAUSEA, Disp: 30 tablet, Rfl: 0;  potassium chloride SA (K-DUR,KLOR-CON) 20 MEQ tablet, Take 20 mEq by mouth daily.  , Disp: , Rfl:   Allergies:  Allergies  Allergen Reactions  . Codeine     hallucinations  . Lorazepam [Lorazepam] Anxiety    Paradoxical agitation when given as pre med for bone marrow biopsy 09/11/06    Past Medical History, Surgical history, Social history, and Family History were reviewed and updated.  Review of Systems: As above  Physical Exam:  height is $RemoveB'5\' 8"'PbPyFuvZ$  (1.727 m) and weight is 251 lb (113.853 kg). His oral temperature is 97.7 F (36.5 C). His blood pressure is 164/63 and his pulse is 55. His respiration is 18.   Elderly appearing gentleman lungs are clear. Cardiac exam regular rate and rhythm. Abdomen is soft. There is no palpable liver spleen. Extremities shows 2+ edema. This up stasis dermatitis changes. Head and neck exam shows no adenopathy in the neck. There is no palpable thyroid. There is no ocular or oral lesions. Skin exam shows some scattered ecchymoses. There is no rashes. Neurological exam is nonfocal.  Lab Results  Component Value Date   WBC 4.5 02/19/2014   HGB 11.0* 02/19/2014   HCT 31.7* 02/19/2014   MCV 87 02/19/2014   PLT 130* 02/19/2014     Chemistry  Component Value Date/Time   NA 145 01/10/2014 1436   NA 140 08/17/2012 1356   NA 137 07/07/2011 1217   K 3.7 01/10/2014 1436   K 3.7 08/17/2012 1356   K 4.8* 07/07/2011 1217   CL 104 04/19/2013 1300   CL 103 08/17/2012 1356   CL 99 07/07/2011 1217   CO2 20* 01/10/2014 1436   CO2 25 08/17/2012 1356   CO2 29 07/07/2011 1217   BUN 40.3* 01/10/2014 1436   BUN 34* 08/17/2012 1356   BUN 41* 07/07/2011 1217   CREATININE 3.6* 01/10/2014 1436   CREATININE 2.11* 08/17/2012 1356   CREATININE 2.1* 07/07/2011 1217   CREATININE 1.90* 03/25/2011 1008      Component Value Date/Time   CALCIUM 8.1* 01/10/2014 1436   CALCIUM 8.4 08/17/2012 1356   CALCIUM 8.7 07/07/2011 1217   ALKPHOS 65  01/10/2014 1436   ALKPHOS 54 08/17/2012 1356   AST 13 01/10/2014 1436   AST 17 08/17/2012 1356   ALT 12 01/10/2014 1436   ALT 31 08/17/2012 1356   BILITOT 0.23 01/10/2014 1436   BILITOT 0.3 08/17/2012 1356         Impression and Plan: William Gaines is a 73 year old gentleman. He has Kappa light chain myeloma. We'll have to see what his labs look like.  Given his progressive kidney issues, this is probably more of a factor in his myeloma right now. I don't see anything with his physical or with lab work that is troublesome.  I would continue to hold off on therapy for myeloma. If we had to go back onto therapy, we could consider Pomalidomide. We also could consider Velcade.  I'll plan to see him back when I see his wife back in about 6 weeks.  I spent about 30 minutes or so with him and his family today.   Volanda Napoleon, MD 4/18/20157:25 AM and

## 2014-04-02 ENCOUNTER — Ambulatory Visit (HOSPITAL_BASED_OUTPATIENT_CLINIC_OR_DEPARTMENT_OTHER): Payer: Medicare Other | Admitting: Lab

## 2014-04-02 ENCOUNTER — Ambulatory Visit: Payer: Medicare Other | Admitting: Hematology & Oncology

## 2014-04-02 DIAGNOSIS — N049 Nephrotic syndrome with unspecified morphologic changes: Secondary | ICD-10-CM

## 2014-04-02 DIAGNOSIS — C9002 Multiple myeloma in relapse: Secondary | ICD-10-CM

## 2014-04-02 DIAGNOSIS — C9001 Multiple myeloma in remission: Secondary | ICD-10-CM

## 2014-04-02 LAB — CBC WITH DIFFERENTIAL (CANCER CENTER ONLY)
BASO#: 0.1 10*3/uL (ref 0.0–0.2)
BASO%: 1.5 % (ref 0.0–2.0)
EOS ABS: 0.2 10*3/uL (ref 0.0–0.5)
EOS%: 3.3 % (ref 0.0–7.0)
HCT: 31.2 % — ABNORMAL LOW (ref 38.7–49.9)
HGB: 10.8 g/dL — ABNORMAL LOW (ref 13.0–17.1)
LYMPH#: 0.5 10*3/uL — ABNORMAL LOW (ref 0.9–3.3)
LYMPH%: 11.9 % — ABNORMAL LOW (ref 14.0–48.0)
MCH: 31.2 pg (ref 28.0–33.4)
MCHC: 34.6 g/dL (ref 32.0–35.9)
MCV: 90 fL (ref 82–98)
MONO#: 0.5 10*3/uL (ref 0.1–0.9)
MONO%: 11.7 % (ref 0.0–13.0)
NEUT#: 3.2 10*3/uL (ref 1.5–6.5)
NEUT%: 71.6 % (ref 40.0–80.0)
PLATELETS: 115 10*3/uL — AB (ref 145–400)
RBC: 3.46 10*6/uL — AB (ref 4.20–5.70)
RDW: 15.4 % (ref 11.1–15.7)
WBC: 4.5 10*3/uL (ref 4.0–10.0)

## 2014-04-02 LAB — CMP (CANCER CENTER ONLY)
ALT(SGPT): 19 U/L (ref 10–47)
AST: 20 U/L (ref 11–38)
Albumin: 1.9 g/dL — ABNORMAL LOW (ref 3.3–5.5)
Alkaline Phosphatase: 46 U/L (ref 26–84)
BILIRUBIN TOTAL: 0.5 mg/dL (ref 0.20–1.60)
BUN: 36 mg/dL — AB (ref 7–22)
CO2: 25 meq/L (ref 18–33)
Calcium: 7.9 mg/dL — ABNORMAL LOW (ref 8.0–10.3)
Chloride: 107 mEq/L (ref 98–108)
Creat: 3.8 mg/dl (ref 0.6–1.2)
GLUCOSE: 107 mg/dL (ref 73–118)
Potassium: 3.7 mEq/L (ref 3.3–4.7)
SODIUM: 138 meq/L (ref 128–145)
Total Protein: 6 g/dL — ABNORMAL LOW (ref 6.4–8.1)

## 2014-04-02 LAB — IRON AND TIBC CHCC
%SAT: 47 % (ref 20–55)
IRON: 96 ug/dL (ref 42–163)
TIBC: 205 ug/dL (ref 202–409)
UIBC: 109 ug/dL — AB (ref 117–376)

## 2014-04-02 LAB — FERRITIN CHCC: FERRITIN: 141 ng/mL (ref 22–316)

## 2014-04-04 LAB — PROTEIN ELECTROPHORESIS, SERUM, WITH REFLEX
ALBUMIN ELP: 42.9 % — AB (ref 55.8–66.1)
ALPHA-1-GLOBULIN: 4.8 % (ref 2.9–4.9)
Alpha-2-Globulin: 11.8 % (ref 7.1–11.8)
BETA GLOBULIN: 7.3 % — AB (ref 4.7–7.2)
Beta 2: 9.6 % — ABNORMAL HIGH (ref 3.2–6.5)
Gamma Globulin: 23.6 % — ABNORMAL HIGH (ref 11.1–18.8)
TOTAL PROTEIN, SERUM ELECTROPHOR: 5.3 g/dL — AB (ref 6.0–8.3)

## 2014-04-04 LAB — RETICULOCYTES (CHCC)
ABS Retic: 63.7 10*3/uL (ref 19.0–186.0)
RBC.: 3.54 MIL/uL — ABNORMAL LOW (ref 4.22–5.81)
RETIC CT PCT: 1.8 % (ref 0.4–2.3)

## 2014-04-04 LAB — KAPPA/LAMBDA LIGHT CHAINS
KAPPA LAMBDA RATIO: 2.19 — AB (ref 0.26–1.65)
Kappa free light chain: 20.7 mg/dL — ABNORMAL HIGH (ref 0.33–1.94)
LAMBDA FREE LGHT CHN: 9.45 mg/dL — AB (ref 0.57–2.63)

## 2014-04-04 LAB — IGG, IGA, IGM
IGM, SERUM: 23 mg/dL — AB (ref 41–251)
IgA: 558 mg/dL — ABNORMAL HIGH (ref 68–379)
IgG (Immunoglobin G), Serum: 1490 mg/dL (ref 650–1600)

## 2014-04-04 LAB — LACTATE DEHYDROGENASE: LDH: 102 U/L (ref 94–250)

## 2014-04-04 LAB — IFE INTERPRETATION

## 2014-04-07 ENCOUNTER — Ambulatory Visit (HOSPITAL_BASED_OUTPATIENT_CLINIC_OR_DEPARTMENT_OTHER): Payer: Medicare Other | Admitting: Hematology & Oncology

## 2014-04-07 ENCOUNTER — Encounter: Payer: Self-pay | Admitting: Hematology & Oncology

## 2014-04-07 VITALS — BP 173/64 | HR 55 | Temp 97.5°F | Resp 20 | Ht 66.0 in | Wt 253.0 lb

## 2014-04-07 DIAGNOSIS — C9000 Multiple myeloma not having achieved remission: Secondary | ICD-10-CM

## 2014-04-07 NOTE — Progress Notes (Signed)
Hematology and Oncology Follow Up Visit  William Gaines [id number] 1941/07/22 73 y.o. 04/07/2014   Principle Diagnosis:   Kappa light chain myeloma  Current Therapy:    Observation     Interim History:  [name] is back for followup. I first saw him back in April. At that point, he had been seen by Dr. Beryle Gaines. He had a history of  Kappa light chain myeloma. He had this diagnosed back in October of 2007. He had been on Revlimid. I think he also may have been on Velcade. He's not on any treatment recently.  He has worsening renal function. He has nephrotic syndrome. He may need to go on dialysis.  Am not sure of all as a were we stand with his myeloma. He had laboratory last week. His serum lidocaine was is 20.7 mg/dL. He also had an elevated lambda light can 9.45 mg/dL. His last 24 urine was done back in December which showed 2100 mg per day of Kappa light chain protein. He also had 431 mg per day excretion of lambda light chain.  He's not had a bone marrow test for a while. I think we have to do one on him.  His last bone Xarelto was done back in January. He had some lesions in the calvarium, in the posterior formal bone. The iliac crests.  He's had no bleeding. His appetite is okay. His had no nausea vomiting. He's had no fever.       Medications: Current outpatient prescriptions:acyclovir (ZOVIRAX) 400 MG tablet, Take 400 mg by mouth daily., Disp: , Rfl: ;  allopurinol (ZYLOPRIM) 300 MG tablet, Take 150 mg by mouth daily. , Disp: , Rfl: ;  aspirin 81 MG tablet, Take 81 mg by mouth daily., Disp: , Rfl: ;  doxazosin (CARDURA) 8 MG tablet, Take 8 mg by mouth at bedtime.  , Disp: , Rfl: ;  furosemide (LASIX) 40 MG tablet, Take 40 mg by mouth every other day. , Disp: , Rfl:  glipiZIDE (GLUCOTROL) 5 MG tablet, Take 5 mg by mouth daily.  , Disp: , Rfl: ;  insulin glargine (LANTUS) 100 UNIT/ML injection, Inject 50 Units into the skin daily.  , Disp: , Rfl: ;  lisinopril  (PRINIVIL,ZESTRIL) 40 MG tablet, Take 40 mg by mouth daily., Disp: , Rfl: ;  metoprolol (LOPRESSOR) 50 MG tablet, Take 50 mg by mouth 2 (two) times daily.  , Disp: , Rfl:  ondansetron (ZOFRAN) 8 MG tablet, TAKE ONE TABLET BY MOUTH BEFORE CHEMO, THEN EVERY 8 HOURS AS NEEDED FOR NAUSEA, Disp: 30 tablet, Rfl: 0;  potassium chloride SA (K-DUR,KLOR-CON) 20 MEQ tablet, Take 20 mEq by mouth daily.  , Disp: , Rfl:   Allergies:  Allergies  Allergen Reactions  . Codeine     hallucinations  . Lorazepam [Lorazepam] Anxiety    Paradoxical agitation when given as pre med for bone marrow biopsy 09/11/06    Past Medical History, Surgical history, Social history, and Family History were reviewed and updated.  Review of Systems: As above  Physical Exam:  height is _0  (1.676 m) and weight is 253 lb (114.76 kg). His oral temperature is 97.5 F (36.4 C). His blood pressure is 173/64 and his pulse is 55. His respiration is 20.   Somewhat elderly appearing gentleman. His head and neck exam shows no ocular or oral lesions. She has no scleral icterus. She has no adenopathy in the neck. Lungs are clear. Cardiac exam regular in with a 1/6  systolic murmur. Abdomen is soft. Has good bowel sounds. There is no fluid wave. There is no palpable liver or spleen tip. Back exam no tenderness over the spine ribs or hips. Extremities shows no clubbing cyanosis or edema. There may be some chronic edema in his lower legs. Skin exam shows no rash. Neurological exam is nonfocal. Lab Results  Component Value Date   WBC 4.5 04/02/2014   HGB 10.8* 04/02/2014   HCT 31.2* 04/02/2014   MCV 90 04/02/2014   PLT 115* 04/02/2014     Chemistry      Component Value Date/Time   NA 138 04/02/2014 1013   NA 141 02/19/2014 1342   NA 145 01/10/2014 1436   K 3.7 04/02/2014 1013   K 3.7 02/19/2014 1342   K 3.7 01/10/2014 1436   CL 107 04/02/2014 1013   CL 112 02/19/2014 1342   CL 104 04/19/2013 1300   CO2 25 04/02/2014 1013   CO2 22 02/19/2014 1342    CO2 20* 01/10/2014 1436   BUN 36* 04/02/2014 1013   BUN 35* 02/19/2014 1342   BUN 40.3* 01/10/2014 1436   CREATININE 3.8* 04/02/2014 1013   CREATININE 3.81* 02/19/2014 1342   CREATININE 3.6* 01/10/2014 1436   CREATININE 1.90* 03/25/2011 1008      Component Value Date/Time   CALCIUM 7.9* 04/02/2014 1013   CALCIUM 7.6* 02/19/2014 1342   CALCIUM 8.1* 01/10/2014 1436   ALKPHOS 46 04/02/2014 1013   ALKPHOS 60 02/19/2014 1342   ALKPHOS 65 01/10/2014 1436   AST 20 04/02/2014 1013   AST 13 02/19/2014 1342   AST 13 01/10/2014 1436   ALT 19 04/02/2014 1013   ALT 12 02/19/2014 1342   ALT 12 01/10/2014 1436   BILITOT 0.50 04/02/2014 1013   BILITOT 0.3 02/19/2014 1342   BILITOT 0.23 01/10/2014 1436         Impression and Plan: William Gaines is 73 year old male.Marland Kitchen He has a history of Kappa light chain myeloma. I really must find out what is going on with him with his myeloma. Unfortunately, I only think this is going to be done by x-ray studies and bone marrow biopsy. We will go ahead and get the bone marrow biopsy done next week. I will do this.  I do want to get a PET scan on him. I think this may help [place].  I also want to get a 24 hour urine on him.  There's definitely is a difficult situation. I don't know what exactly is going we, so I will have to do some studies to try to figure this out. If he is to go on dialysis in the future then I think we have to really know what is going on with the myeloma  I will probably plan to get him back in one month or so this will be dependent on what we find with his studies  Volanda Napoleon, MD 6/1/20155:41 PM

## 2014-04-09 ENCOUNTER — Telehealth: Payer: Self-pay | Admitting: Hematology & Oncology

## 2014-04-09 NOTE — Telephone Encounter (Signed)
Pt aware of 6-15 PET to be NPO 6 hrs. Per Jan pt can take lantus as long as it is 6 hrs prior to scan. She is aware he takes at night before he goes to bed and the scan is at 11am.

## 2014-04-15 ENCOUNTER — Other Ambulatory Visit (HOSPITAL_BASED_OUTPATIENT_CLINIC_OR_DEPARTMENT_OTHER): Payer: Medicare Other | Admitting: Lab

## 2014-04-15 ENCOUNTER — Ambulatory Visit (HOSPITAL_BASED_OUTPATIENT_CLINIC_OR_DEPARTMENT_OTHER): Payer: Medicare Other | Admitting: Hematology & Oncology

## 2014-04-15 ENCOUNTER — Other Ambulatory Visit (HOSPITAL_COMMUNITY)
Admission: RE | Admit: 2014-04-15 | Discharge: 2014-04-15 | Disposition: A | Discharge status: Home / Self Care | Payer: Medicare Other | Source: Ambulatory Visit | Attending: Hematology & Oncology | Admitting: Hematology & Oncology

## 2014-04-15 ENCOUNTER — Other Ambulatory Visit: Payer: Self-pay | Admitting: Oncology

## 2014-04-15 VITALS — BP 211/82 | HR 79 | Temp 97.9°F | Resp 16

## 2014-04-15 DIAGNOSIS — D696 Thrombocytopenia, unspecified: Secondary | ICD-10-CM | POA: Diagnosis not present

## 2014-04-15 DIAGNOSIS — D649 Anemia, unspecified: Secondary | ICD-10-CM | POA: Diagnosis not present

## 2014-04-15 DIAGNOSIS — C9000 Multiple myeloma not having achieved remission: Secondary | ICD-10-CM

## 2014-04-15 DIAGNOSIS — D72822 Plasmacytosis: Secondary | ICD-10-CM | POA: Insufficient documentation

## 2014-04-15 LAB — BONE MARROW EXAM: BONE MARROW EXAM: 397

## 2014-04-15 NOTE — Progress Notes (Signed)
Time out performed at 0815. Dr Marin Olp performed bone marrow biopsy. Pt tolerated well. Monitored for 30 minutes; pt rested on back during this period. Vitals assessed before and after procedure.

## 2014-04-15 NOTE — Progress Notes (Signed)
This is a bone marrow biopsy and aspirate procedure note for William Gaines.  He was brought to the treatment room at the cancer center. He signed the consent form.  We placed him on to his right side. He was a fairly large fellow and there was more adipose tissue than I had expected in the left posterior iliac crest region.  We cleaned the left posterior iliac crest region sterilely. We then infiltrated 8 cc of lidocaine under the skin. I had to use a spinal needle to read the periosteum.  We made a decision to the skin with a scalpel.  With the biopsy needle, I barely made it to the iliac crest. Unfortunately, we do not have an extra long biopsy needle available.  I did get 2 aspirates. One was sent for cytogenetics and flow cytometry.  Again, I really could not advance the biopsy needle much further given his large size. I got a very small biopsy core. Again the aspirate was adequate.  Redressed the procedure site sterilely and placed a gauze on top with tape.  He tolerated the procedure well.

## 2014-04-16 ENCOUNTER — Encounter: Payer: Self-pay | Admitting: Hematology & Oncology

## 2014-04-16 LAB — CBC
HCT: 33.2 % — ABNORMAL LOW (ref 39.0–52.0)
HEMOGLOBIN: 10.8 g/dL — AB (ref 13.0–17.0)
MCH: 30.6 pg (ref 26.0–34.0)
MCHC: 32.5 g/dL (ref 30.0–36.0)
MCV: 94.1 fL (ref 78.0–100.0)
Platelets: 149 10*3/uL — ABNORMAL LOW (ref 150–400)
RBC: 3.53 MIL/uL — ABNORMAL LOW (ref 4.22–5.81)
RDW: 16.4 % — ABNORMAL HIGH (ref 11.5–15.5)
WBC: 4.5 10*3/uL (ref 4.0–10.5)

## 2014-04-17 ENCOUNTER — Ambulatory Visit (HOSPITAL_COMMUNITY): Payer: Medicare Other

## 2014-04-21 ENCOUNTER — Other Ambulatory Visit: Payer: Self-pay | Admitting: *Deleted

## 2014-04-21 ENCOUNTER — Other Ambulatory Visit: Payer: Self-pay | Admitting: Hematology & Oncology

## 2014-04-21 ENCOUNTER — Ambulatory Visit (HOSPITAL_COMMUNITY)
Admission: RE | Admit: 2014-04-21 | Discharge: 2014-04-21 | Disposition: A | Discharge status: Home / Self Care | Payer: Medicare Other | Source: Ambulatory Visit | Attending: Hematology & Oncology | Admitting: Hematology & Oncology

## 2014-04-21 ENCOUNTER — Encounter (HOSPITAL_COMMUNITY): Payer: Self-pay

## 2014-04-21 DIAGNOSIS — C9000 Multiple myeloma not having achieved remission: Secondary | ICD-10-CM | POA: Insufficient documentation

## 2014-04-21 LAB — GLUCOSE, CAPILLARY: GLUCOSE-CAPILLARY: 81 mg/dL (ref 70–99)

## 2014-04-21 MED ORDER — ONDANSETRON HCL 8 MG PO TABS: ORAL_TABLET | ORAL | Status: DC

## 2014-04-21 MED ORDER — FLUDEOXYGLUCOSE F - 18 (FDG) INJECTION
14.9000 | Freq: Once | INTRAVENOUS | Status: AC | PRN
  Administered 2014-04-21: 14.9 via INTRAVENOUS

## 2014-04-23 LAB — CHROMOSOME ANALYSIS, BONE MARROW

## 2014-04-23 LAB — TISSUE HYBRIDIZATION (BONE MARROW)-NCBH

## 2014-04-25 ENCOUNTER — Encounter: Payer: Self-pay | Admitting: Hematology & Oncology

## 2014-04-25 ENCOUNTER — Telehealth: Payer: Self-pay | Admitting: *Deleted

## 2014-04-25 NOTE — Telephone Encounter (Addendum)
Message copied by Lenn Sink on Fri Apr 25, 2014 10:21 AM ------      Message from: Burney Gauze R      Created: Thu Apr 24, 2014  1:40 PM       Call - NO active myeloma in the bones!!!  Fantastic!!  Pete ------Informed pt there is no active myeloma in the bones!

## 2014-04-28 ENCOUNTER — Encounter: Payer: Self-pay | Admitting: Hematology & Oncology

## 2014-04-29 ENCOUNTER — Encounter (HOSPITAL_COMMUNITY): Payer: Self-pay

## 2014-05-07 ENCOUNTER — Ambulatory Visit (HOSPITAL_BASED_OUTPATIENT_CLINIC_OR_DEPARTMENT_OTHER): Payer: Medicare Other | Admitting: Hematology & Oncology

## 2014-05-07 ENCOUNTER — Encounter: Payer: Self-pay | Admitting: Hematology & Oncology

## 2014-05-07 ENCOUNTER — Ambulatory Visit: Payer: Medicare Other | Admitting: Lab

## 2014-05-07 VITALS — BP 197/55 | HR 61 | Temp 97.6°F | Resp 20 | Ht 66.0 in | Wt 254.0 lb

## 2014-05-07 DIAGNOSIS — C9000 Multiple myeloma not having achieved remission: Secondary | ICD-10-CM | POA: Diagnosis not present

## 2014-05-07 LAB — CBC WITH DIFFERENTIAL (CANCER CENTER ONLY)
BASO#: 0 10*3/uL (ref 0.0–0.2)
BASO%: 0.9 % (ref 0.0–2.0)
EOS%: 1.8 % (ref 0.0–7.0)
Eosinophils Absolute: 0.1 10*3/uL (ref 0.0–0.5)
HEMATOCRIT: 30.5 % — AB (ref 38.7–49.9)
HEMOGLOBIN: 10.3 g/dL — AB (ref 13.0–17.1)
LYMPH#: 0.5 10*3/uL — ABNORMAL LOW (ref 0.9–3.3)
LYMPH%: 11.7 % — ABNORMAL LOW (ref 14.0–48.0)
MCH: 31.8 pg (ref 28.0–33.4)
MCHC: 33.8 g/dL (ref 32.0–35.9)
MCV: 94 fL (ref 82–98)
MONO#: 0.4 10*3/uL (ref 0.1–0.9)
MONO%: 8.2 % (ref 0.0–13.0)
NEUT#: 3.4 10*3/uL (ref 1.5–6.5)
NEUT%: 77.4 % (ref 40.0–80.0)
Platelets: 95 10*3/uL — ABNORMAL LOW (ref 145–400)
RBC: 3.24 10*6/uL — ABNORMAL LOW (ref 4.20–5.70)
RDW: 14.3 % (ref 11.1–15.7)
WBC: 4.4 10*3/uL (ref 4.0–10.0)

## 2014-05-07 LAB — CMP (CANCER CENTER ONLY)
ALT: 19 U/L (ref 10–47)
AST: 18 U/L (ref 11–38)
Albumin: 2.3 g/dL — ABNORMAL LOW (ref 3.3–5.5)
Alkaline Phosphatase: 49 U/L (ref 26–84)
BILIRUBIN TOTAL: 0.4 mg/dL (ref 0.20–1.60)
BUN, Bld: 46 mg/dL — ABNORMAL HIGH (ref 7–22)
CALCIUM: 7.4 mg/dL — AB (ref 8.0–10.3)
CHLORIDE: 104 meq/L (ref 98–108)
CO2: 23 meq/L (ref 18–33)
Creat: 4.5 mg/dl (ref 0.6–1.2)
GLUCOSE: 121 mg/dL — AB (ref 73–118)
Potassium: 4.4 mEq/L (ref 3.3–4.7)
Sodium: 143 mEq/L (ref 128–145)
Total Protein: 6.2 g/dL — ABNORMAL LOW (ref 6.4–8.1)

## 2014-05-07 NOTE — Progress Notes (Signed)
[facility]  [phone] [phone]  ID: William Gaines OB: 12-28-1940 MR#: [id number]#:[id number] Patient Care Team: Venetia Constable, MD as PCP - General (Unknown Physician Specialty) Roanna Epley (Internal Medicine)  DIAGNOSIS: Kappa light chain myeloma  INTERVAL HISTORY: William Gaines is back for his 1 month follow-up. He was first seen back in April. At that point, he had been seen by William Gaines. He had a history of Kappa light chain myeloma. He had this diagnosed back in October of 2007. He had been on Revlimid and also may have been on Velcade. He's not currently on any treatment. He has worsening renal function with Creatinine of 4.5 and BUN 46. He has nephrotic syndrome. He has an appointment with a surgeon on July 20th for fistula placement.  He will then be started on dialysis. His myeloma seems to be under control at this time. He states that his energy level has improved but that he concerned about his thyroid. He was given a container to take home today and collect a 24 hr urine. He denies fever, chills, headache, dizziness, blurred vision, cough, rashes, chest pain, palpitations, constipation, problems urinating, blood in urine or stool, numbness or tingling in extremities. He states that he does have some diarrhea that is controlled with imodium. He states that he does become SOB with exertion at times. He also states that the swelling in his right leg is improved and denies any other swelling. Bone marrow biopsy was done by William Gaines on 04/15/14 and was unremarkable. He had a PET scan on 04/07/14 which showed no changes in myeloma and a small nodule on his thyroid.   CURRENT TREATMENT: Observation  REVIEW OF SYSTEMS: All other 10 point review of systems is negative except for those things mentioned above.   PAST MEDICAL HISTORY: Past Medical History  Diagnosis Date  . Diabetes mellitus   . Hypertension   . Multiple myeloma, in relapse    . Nephrotic syndrome in diabetes mellitus   . Fever, unspecified 10/04/2011  . Multiple myeloma, in relapse 10/04/2011  . Chronic renal insufficiency, stage III (moderate)     diabetes, Hypertension, nephrotic syndrome  . Gout   . Cellulitis and abscess of leg     diabetic  . Hernia of abdominal wall     ombelicus  . Gout 10/04/2011  . Fracture, vertebra, pathologic 12/23/2011  . Diabetic peripheral neuropathy 04/20/2012   PAST SURGICAL HISTORY: Past Surgical History  Procedure Laterality Date  . Back surgery  2007 approx.    due to multiple myeloma   FAMILY HISTORY Family History  Problem Relation Age of Onset  . Hypertension Mother   . Diabetes Mother   . Cancer Brother   . Diabetes Son    GYNECOLOGIC HISTORY:  No LMP for male patient.   SOCIAL HISTORY: History   Social History  . Marital Status: Married    Spouse Name: N/A    Number of Children: N/A  . Years of Education: N/A   Occupational History  . Not on file.   Social History Main Topics  . Smoking status: Former Smoker -- 1.00 packs/day for 42 years    Types: Cigarettes    Start date: 08/21/1954    Quit date: 10/03/1996  . Smokeless tobacco: Current User    Types: Chew     Comment: quit smoking 17years ago  . Alcohol Use: No  . Drug Use: No     Comment: quit approx. 15  years  . Sexual Activity: Not Currently   Other Topics Concern  . Not on file   Social History Narrative  . No narrative on file    ADVANCED DIRECTIVES: <no information>  HEALTH MAINTENANCE: History  Substance Use Topics  . Smoking status: Former Smoker -- 1.00 packs/day for 42 years    Types: Cigarettes    Start date: 08/21/1954    Quit date: 10/03/1996  . Smokeless tobacco: Current User    Types: Chew     Comment: quit smoking 17years ago  . Alcohol Use: No   Colonoscopy: Bone density: Lipid panel:  Allergies  Allergen Reactions  . Codeine     hallucinations  . Lorazepam [Lorazepam] Anxiety    Paradoxical  agitation when given as pre med for bone marrow biopsy 09/11/06    Current Outpatient Prescriptions  Medication Sig Dispense Refill  . acyclovir (ZOVIRAX) 400 MG tablet Take 400 mg by mouth daily.      Marland Kitchen allopurinol (ZYLOPRIM) 300 MG tablet Take 150 mg by mouth daily.       Marland Kitchen aspirin 81 MG tablet Take 81 mg by mouth daily.      Marland Kitchen doxazosin (CARDURA) 8 MG tablet Take 8 mg by mouth at bedtime.        . furosemide (LASIX) 40 MG tablet Take 40 mg by mouth every other day.       Marland Kitchen glipiZIDE (GLUCOTROL) 5 MG tablet Take 5 mg by mouth daily.        . insulin glargine (LANTUS) 100 UNIT/ML injection Inject 50 Units into the skin daily.        Marland Kitchen lisinopril (PRINIVIL,ZESTRIL) 40 MG tablet Take 40 mg by mouth daily.      . metoprolol (LOPRESSOR) 50 MG tablet Take 50 mg by mouth 2 (two) times daily.        . ondansetron (ZOFRAN) 8 MG tablet TAKE ONE TABLET BY MOUTH BEFORE CHEMO, THEN EVERY 8 HOURS AS NEEDED FOR NAUSEA  30 tablet  5  . potassium chloride SA (K-DUR,KLOR-CON) 20 MEQ tablet Take 20 mEq by mouth daily.         No current facility-administered medications for this visit.   OBJECTIVE: Filed Vitals:   05/07/14 1534  BP: 197/55  Pulse: 61  Temp: 97.6 F (36.4 C)  Resp: 20   Body mass index is 41.02 kg/(m^2). ECOG FS:1 - Symptomatic but completely ambulatory Ocular: Sclerae unicteric, pupils equal, round and reactive to light Ear-nose-throat: Oropharynx clear, dentition fair Lymphatic: No cervical or supraclavicular adenopathy Lungs no rales or rhonchi, good excursion bilaterally Heart regular rate and rhythm, no murmur appreciated Abd soft, nontender, positive bowel sounds MSK no focal spinal tenderness, no joint edema Neuro: non-focal, well-oriented, appropriate affect  LAB RESULTS: CMP     Component Value Date/Time   NA 143 05/07/2014 1501   NA 141 02/19/2014 1342   NA 145 01/10/2014 1436   K 4.4 05/07/2014 1501   K 3.7 02/19/2014 1342   K 3.7 01/10/2014 1436   CL 104 05/07/2014 1501    CL 112 02/19/2014 1342   CL 104 04/19/2013 1300   CO2 23 05/07/2014 1501   CO2 22 02/19/2014 1342   CO2 20* 01/10/2014 1436   GLUCOSE 121* 05/07/2014 1501   GLUCOSE 74 02/19/2014 1342   GLUCOSE 81 01/10/2014 1436   GLUCOSE 205* 04/19/2013 1300   BUN 46* 05/07/2014 1501   BUN 35* 02/19/2014 1342   BUN 40.3* 01/10/2014 1436   CREATININE  4.5* 05/07/2014 1501   CREATININE 3.81* 02/19/2014 1342   CREATININE 3.6* 01/10/2014 1436   CREATININE 1.90* 03/25/2011 1008   CALCIUM 7.4* 05/07/2014 1501   CALCIUM 7.6* 02/19/2014 1342   CALCIUM 8.1* 01/10/2014 1436   PROT 6.2* 05/07/2014 1501   PROT 5.9* 02/19/2014 1342   PROT 6.6 01/10/2014 1436   ALBUMIN 2.5* 02/19/2014 1342   ALBUMIN 2.2* 01/10/2014 1436   AST 18 05/07/2014 1501   AST 13 02/19/2014 1342   AST 13 01/10/2014 1436   ALT 19 05/07/2014 1501   ALT 12 02/19/2014 1342   ALT 12 01/10/2014 1436   ALKPHOS 49 05/07/2014 1501   ALKPHOS 60 02/19/2014 1342   ALKPHOS 65 01/10/2014 1436   BILITOT 0.40 05/07/2014 1501   BILITOT 0.3 02/19/2014 1342   BILITOT 0.23 01/10/2014 1436   GFRNONAA 28* 10/05/2011 0345   GFRAA 33* 10/05/2011 0345   No results found for this basename: SPEP, UPEP,  kappa and lambda light chains   Lab Results  Component Value Date   WBC 4.4 05/07/2014   NEUTROABS 3.4 05/07/2014   HGB 10.3* 05/07/2014   HCT 30.5* 05/07/2014   MCV 94 05/07/2014   PLT 95* 05/07/2014   No results found for this basename: LABCA2   No components found with this basename: [id number]   No results found for this basename: INR,  in the last 168 hours  STUDIES: Nm Pet Image Initial (pi) Whole Body  04/21/2014   CLINICAL DATA:  Initial treatment strategy for multiple myeloma  EXAM: NUCLEAR MEDICINE PET WHOLE BODY  TECHNIQUE: 14.9 mCi F-18 FDG was injected intravenously. Full-ring PET imaging was performed from the vertex to the feet after the radiotracer. CT data was obtained and used for attenuation correction and anatomic localization.  FASTING BLOOD GLUCOSE:  Value: $Remov'81mg'Zojzyq$ /dl  COMPARISON:  Skeletal  survey 11/29/2013  FINDINGS: Head/Neck: There is a lucent lesion within the high right frontal bone without associated metabolic activity. There is focal metabolic activity associated the inferior aspect of the left lobe of thyroid gland with SUV max thecal 10.0. There is a 19 mm nodule at this level.  Chest: No hypermetabolic mediastinal or hilar nodes. No suspicious pulmonary nodules on the CT scan.  Abdomen/Pelvis: No abnormal hypermetabolic activity within the liver, pancreas, adrenal glands, or spleen. No hypermetabolic lymph nodes in the abdomen or pelvis.  Skeleton: There is a lucent lesion within the left iliac bone (image 175) without associated metabolic activity. There is a lytic lesion within the inferior left pubic ramus without significant metabolic activity (image 017). Within the thoracic spine there are lucent lesions with central trabeculation suggesting hemangiomas. Posterior lumbar fusion noted.  Extremities: No hypermetabolic activity to suggest metastasis.  IMPRESSION: 1. Lytic skeletal lesions within the right calvarium, left iliac wing, and left ischium without significant metabolic activity are most consistent with inactive myeloma. 2. No evidence of metabolic active myeloma or new lesions compared to skeletal survey. 3. Hypermetabolic nodule in the left lobe of thyroid gland. Recommend ultrasound of the thyroid gland to evaluate for potential thyroid carcinoma.   Electronically Signed   By: Suzy Bouchard M.D.   On: 04/21/2014 15:04    ASSESSMENT/PLAN: WilliamCourtwright is pleasant 73 year old male with a history of Kappa light chain myeloma. Given his recent labs and workup his myeloma does not appear to be progressive and is at a low state.   We discussed his CBC, CMP, PET scan and bone marrow biopsy in detail.   He will start  a 24 hour urine in the morning .  He has an appointment with a surgeon on July 20th for fistula placement. He will then begin dialysis.   Follow-up appointment  and labs with Dr. Marin Olp in 2 months.    The plan was discussed with Dr. Marin Olp and the patient and they are both in agreement.   The patient knows to call here with any questions or problems and to go to the ED in the event of an emergency. We can certainly see him here sooner if need be.   Eliezer Bottom, NP 05/07/2014 4:27 PM

## 2014-05-12 LAB — PROTEIN ELECTROPHORESIS, SERUM, WITH REFLEX
ALBUMIN ELP: 44.9 % — AB (ref 55.8–66.1)
ALPHA-1-GLOBULIN: 5.6 % — AB (ref 2.9–4.9)
Alpha-2-Globulin: 11.2 % (ref 7.1–11.8)
Beta 2: 8.6 % — ABNORMAL HIGH (ref 3.2–6.5)
Beta Globulin: 7.4 % — ABNORMAL HIGH (ref 4.7–7.2)
Gamma Globulin: 22.3 % — ABNORMAL HIGH (ref 11.1–18.8)
Total Protein, Serum Electrophoresis: 5.6 g/dL — ABNORMAL LOW (ref 6.0–8.3)

## 2014-05-12 LAB — IFE INTERPRETATION

## 2014-05-12 LAB — IGG, IGA, IGM
IGG (IMMUNOGLOBIN G), SERUM: 1290 mg/dL (ref 650–1600)
IGM, SERUM: 21 mg/dL — AB (ref 41–251)
IgA: 480 mg/dL — ABNORMAL HIGH (ref 68–379)

## 2014-05-12 LAB — KAPPA/LAMBDA LIGHT CHAINS
KAPPA FREE LGHT CHN: 22.1 mg/dL — AB (ref 0.33–1.94)
Kappa:Lambda Ratio: 2.15 — ABNORMAL HIGH (ref 0.26–1.65)
Lambda Free Lght Chn: 10.3 mg/dL — ABNORMAL HIGH (ref 0.57–2.63)

## 2014-05-12 LAB — LACTATE DEHYDROGENASE: LDH: 113 U/L (ref 94–250)

## 2014-05-19 ENCOUNTER — Telehealth: Payer: Self-pay | Admitting: Hematology & Oncology

## 2014-05-19 NOTE — Telephone Encounter (Signed)
Pt's daughter Carney Bern) dropped of specimen today. Nikki R, put in lab fridge.

## 2014-05-23 LAB — UIFE/LIGHT CHAINS/TP QN, 24-HR UR
ALBUMIN, U: DETECTED
ALPHA 2 UR: DETECTED — AB
Alpha 1, Urine: DETECTED — AB
Beta, Urine: DETECTED — AB
FREE KAPPA/LAMBDA RATIO: 6.22 ratio (ref 2.04–10.37)
Free Kappa Lt Chains,Ur: 47.3 mg/dL — ABNORMAL HIGH (ref 0.14–2.42)
Free Lambda Excretion/Day: 190.25 mg/d
Free Lambda Lt Chains,Ur: 7.61 mg/dL — ABNORMAL HIGH (ref 0.02–0.67)
Free Lt Chn Excr Rate: 1182.5 mg/d
Gamma Globulin, Urine: DETECTED — AB
TOTAL PROTEIN, URINE-UR/DAY: 9343 mg/d — AB (ref 10–140)
Time: 24 hours
Total Protein, Urine: 373.7 mg/dL
Volume, Urine: 2500 mL

## 2014-05-26 ENCOUNTER — Telehealth: Payer: Self-pay | Admitting: *Deleted

## 2014-05-26 NOTE — Telephone Encounter (Addendum)
Message copied by Lenn Sink on Mon May 26, 2014 10:13 AM ------      Message from: Volanda Napoleon      Created: Sun May 25, 2014  9:44 AM       Call - do not see any myeloma protein in the urine.  The protein is from your kidney disease. pete ------Informed pt no myeloma protein in the urine.  The protein is from your kidney disease.

## 2014-06-23 ENCOUNTER — Telehealth: Payer: Self-pay | Admitting: Hematology & Oncology

## 2014-06-23 NOTE — Telephone Encounter (Signed)
RN aware pt moved 9-3 to 9-28

## 2014-07-08 ENCOUNTER — Inpatient Hospital Stay (HOSPITAL_COMMUNITY)
Admission: EM | Admit: 2014-07-08 | Discharge: 2014-07-15 | DRG: 808 | Disposition: A | Discharge status: Home Health | Payer: Medicare Other | Attending: Internal Medicine | Admitting: Internal Medicine

## 2014-07-08 ENCOUNTER — Encounter (HOSPITAL_COMMUNITY): Payer: Self-pay | Admitting: Emergency Medicine

## 2014-07-08 DIAGNOSIS — R5383 Other fatigue: Secondary | ICD-10-CM | POA: Diagnosis not present

## 2014-07-08 DIAGNOSIS — D61818 Other pancytopenia: Principal | ICD-10-CM

## 2014-07-08 DIAGNOSIS — Z8249 Family history of ischemic heart disease and other diseases of the circulatory system: Secondary | ICD-10-CM | POA: Diagnosis not present

## 2014-07-08 DIAGNOSIS — R5381 Other malaise: Secondary | ICD-10-CM | POA: Diagnosis present

## 2014-07-08 DIAGNOSIS — I509 Heart failure, unspecified: Secondary | ICD-10-CM | POA: Diagnosis present

## 2014-07-08 DIAGNOSIS — R197 Diarrhea, unspecified: Secondary | ICD-10-CM | POA: Diagnosis present

## 2014-07-08 DIAGNOSIS — H547 Unspecified visual loss: Secondary | ICD-10-CM | POA: Diagnosis present

## 2014-07-08 DIAGNOSIS — R1013 Epigastric pain: Secondary | ICD-10-CM

## 2014-07-08 DIAGNOSIS — E1149 Type 2 diabetes mellitus with other diabetic neurological complication: Secondary | ICD-10-CM | POA: Diagnosis present

## 2014-07-08 DIAGNOSIS — Z79899 Other long term (current) drug therapy: Secondary | ICD-10-CM

## 2014-07-08 DIAGNOSIS — N189 Chronic kidney disease, unspecified: Secondary | ICD-10-CM

## 2014-07-08 DIAGNOSIS — E669 Obesity, unspecified: Secondary | ICD-10-CM | POA: Diagnosis present

## 2014-07-08 DIAGNOSIS — I5043 Acute on chronic combined systolic (congestive) and diastolic (congestive) heart failure: Secondary | ICD-10-CM | POA: Diagnosis present

## 2014-07-08 DIAGNOSIS — Z833 Family history of diabetes mellitus: Secondary | ICD-10-CM | POA: Diagnosis not present

## 2014-07-08 DIAGNOSIS — D693 Immune thrombocytopenic purpura: Secondary | ICD-10-CM | POA: Diagnosis present

## 2014-07-08 DIAGNOSIS — E0842 Diabetes mellitus due to underlying condition with diabetic polyneuropathy: Secondary | ICD-10-CM | POA: Diagnosis present

## 2014-07-08 DIAGNOSIS — N058 Unspecified nephritic syndrome with other morphologic changes: Secondary | ICD-10-CM | POA: Diagnosis present

## 2014-07-08 DIAGNOSIS — I120 Hypertensive chronic kidney disease with stage 5 chronic kidney disease or end stage renal disease: Secondary | ICD-10-CM | POA: Diagnosis present

## 2014-07-08 DIAGNOSIS — E872 Acidosis, unspecified: Secondary | ICD-10-CM | POA: Diagnosis present

## 2014-07-08 DIAGNOSIS — K3189 Other diseases of stomach and duodenum: Secondary | ICD-10-CM | POA: Diagnosis present

## 2014-07-08 DIAGNOSIS — D638 Anemia in other chronic diseases classified elsewhere: Secondary | ICD-10-CM | POA: Diagnosis present

## 2014-07-08 DIAGNOSIS — Z885 Allergy status to narcotic agent status: Secondary | ICD-10-CM

## 2014-07-08 DIAGNOSIS — D631 Anemia in chronic kidney disease: Secondary | ICD-10-CM | POA: Diagnosis present

## 2014-07-08 DIAGNOSIS — E1129 Type 2 diabetes mellitus with other diabetic kidney complication: Secondary | ICD-10-CM | POA: Diagnosis present

## 2014-07-08 DIAGNOSIS — R319 Hematuria, unspecified: Secondary | ICD-10-CM | POA: Diagnosis not present

## 2014-07-08 DIAGNOSIS — M899 Disorder of bone, unspecified: Secondary | ICD-10-CM | POA: Diagnosis present

## 2014-07-08 DIAGNOSIS — E1159 Type 2 diabetes mellitus with other circulatory complications: Secondary | ICD-10-CM

## 2014-07-08 DIAGNOSIS — E875 Hyperkalemia: Secondary | ICD-10-CM | POA: Diagnosis present

## 2014-07-08 DIAGNOSIS — M949 Disorder of cartilage, unspecified: Secondary | ICD-10-CM | POA: Diagnosis present

## 2014-07-08 DIAGNOSIS — F17200 Nicotine dependence, unspecified, uncomplicated: Secondary | ICD-10-CM | POA: Diagnosis present

## 2014-07-08 DIAGNOSIS — N039 Chronic nephritic syndrome with unspecified morphologic changes: Secondary | ICD-10-CM

## 2014-07-08 DIAGNOSIS — N179 Acute kidney failure, unspecified: Secondary | ICD-10-CM | POA: Diagnosis present

## 2014-07-08 DIAGNOSIS — M109 Gout, unspecified: Secondary | ICD-10-CM | POA: Diagnosis present

## 2014-07-08 DIAGNOSIS — Z7982 Long term (current) use of aspirin: Secondary | ICD-10-CM

## 2014-07-08 DIAGNOSIS — C9002 Multiple myeloma in relapse: Secondary | ICD-10-CM | POA: Diagnosis present

## 2014-07-08 DIAGNOSIS — Z888 Allergy status to other drugs, medicaments and biological substances status: Secondary | ICD-10-CM

## 2014-07-08 DIAGNOSIS — E1169 Type 2 diabetes mellitus with other specified complication: Secondary | ICD-10-CM | POA: Diagnosis present

## 2014-07-08 DIAGNOSIS — E1142 Type 2 diabetes mellitus with diabetic polyneuropathy: Secondary | ICD-10-CM

## 2014-07-08 DIAGNOSIS — N19 Unspecified kidney failure: Secondary | ICD-10-CM

## 2014-07-08 DIAGNOSIS — I152 Hypertension secondary to endocrine disorders: Secondary | ICD-10-CM

## 2014-07-08 DIAGNOSIS — I10 Essential (primary) hypertension: Secondary | ICD-10-CM

## 2014-07-08 DIAGNOSIS — E1349 Other specified diabetes mellitus with other diabetic neurological complication: Secondary | ICD-10-CM

## 2014-07-08 DIAGNOSIS — N184 Chronic kidney disease, stage 4 (severe): Secondary | ICD-10-CM | POA: Diagnosis present

## 2014-07-08 DIAGNOSIS — C9000 Multiple myeloma not having achieved remission: Secondary | ICD-10-CM

## 2014-07-08 DIAGNOSIS — N185 Chronic kidney disease, stage 5: Secondary | ICD-10-CM | POA: Diagnosis present

## 2014-07-08 DIAGNOSIS — M8448XA Pathological fracture, other site, initial encounter for fracture: Secondary | ICD-10-CM | POA: Diagnosis present

## 2014-07-08 DIAGNOSIS — E118 Type 2 diabetes mellitus with unspecified complications: Secondary | ICD-10-CM

## 2014-07-08 LAB — DIC (DISSEMINATED INTRAVASCULAR COAGULATION)PANEL
D-Dimer, Quant: 0.88 ug/mL-FEU — ABNORMAL HIGH (ref 0.00–0.48)
Fibrinogen: 506 mg/dL — ABNORMAL HIGH (ref 204–475)
INR: 1.34 (ref 0.00–1.49)
Platelets: 6 10*3/uL — CL (ref 150–400)
Smear Review: NONE SEEN
aPTT: 31 seconds (ref 24–37)

## 2014-07-08 LAB — PREPARE RBC (CROSSMATCH)

## 2014-07-08 LAB — CBC WITH DIFFERENTIAL/PLATELET
BASOS ABS: 0 10*3/uL (ref 0.0–0.1)
Basophils Relative: 0 % (ref 0–1)
EOS ABS: 0 10*3/uL (ref 0.0–0.7)
Eosinophils Relative: 1 % (ref 0–5)
HEMATOCRIT: 17.4 % — AB (ref 39.0–52.0)
Hemoglobin: 6.1 g/dL — CL (ref 13.0–17.0)
Lymphocytes Relative: 17 % (ref 12–46)
Lymphs Abs: 0.4 10*3/uL — ABNORMAL LOW (ref 0.7–4.0)
MCH: 31.3 pg (ref 26.0–34.0)
MCHC: 35.1 g/dL (ref 30.0–36.0)
MCV: 89.2 fL (ref 78.0–100.0)
MONOS PCT: 10 % (ref 3–12)
Monocytes Absolute: 0.2 10*3/uL (ref 0.1–1.0)
NEUTROS ABS: 1.8 10*3/uL (ref 1.7–7.7)
NEUTROS PCT: 72 % (ref 43–77)
Platelets: <5 10*3/uL — CL (ref 150–400)
RBC: 1.95 MIL/uL — ABNORMAL LOW (ref 4.22–5.81)
RDW: 13.4 % (ref 11.5–15.5)
WBC: 2.4 10*3/uL — AB (ref 4.0–10.5)

## 2014-07-08 LAB — GLUCOSE, CAPILLARY
Glucose-Capillary: 159 mg/dL — ABNORMAL HIGH (ref 70–99)
Glucose-Capillary: 222 mg/dL — ABNORMAL HIGH (ref 70–99)

## 2014-07-08 LAB — HEPATIC FUNCTION PANEL
ALT: 16 U/L (ref 0–53)
AST: 20 U/L (ref 0–37)
Albumin: 2.5 g/dL — ABNORMAL LOW (ref 3.5–5.2)
Alkaline Phosphatase: 63 U/L (ref 39–117)
BILIRUBIN TOTAL: 0.2 mg/dL — AB (ref 0.3–1.2)
Bilirubin, Direct: <0.2 mg/dL (ref 0.0–0.3)
Total Protein: 6.5 g/dL (ref 6.0–8.3)

## 2014-07-08 LAB — BASIC METABOLIC PANEL
ANION GAP: 14 (ref 5–15)
BUN: 78 mg/dL — ABNORMAL HIGH (ref 6–23)
CHLORIDE: 108 meq/L (ref 96–112)
CO2: 20 mEq/L (ref 19–32)
Calcium: 8 mg/dL — ABNORMAL LOW (ref 8.4–10.5)
Creatinine, Ser: 6.17 mg/dL — ABNORMAL HIGH (ref 0.50–1.35)
GFR, EST AFRICAN AMERICAN: 9 mL/min — AB (ref >90)
GFR, EST NON AFRICAN AMERICAN: 8 mL/min — AB (ref >90)
Glucose, Bld: 51 mg/dL — ABNORMAL LOW (ref 70–99)
POTASSIUM: 4.7 meq/L (ref 3.7–5.3)
SODIUM: 142 meq/L (ref 137–147)

## 2014-07-08 LAB — RETICULOCYTES
RBC.: 2.04 MIL/uL — AB (ref 4.22–5.81)
RETIC CT PCT: 0.4 % (ref 0.4–3.1)
Retic Count, Absolute: 8.2 10*3/uL — ABNORMAL LOW (ref 19.0–186.0)

## 2014-07-08 LAB — POC OCCULT BLOOD, ED: FECAL OCCULT BLD: POSITIVE — AB

## 2014-07-08 LAB — DIC (DISSEMINATED INTRAVASCULAR COAGULATION) PANEL: PROTHROMBIN TIME: 16.6 s — AB (ref 11.6–15.2)

## 2014-07-08 MED ORDER — POTASSIUM CHLORIDE CRYS ER 20 MEQ PO TBCR
20.0000 meq | EXTENDED_RELEASE_TABLET | ORAL | Status: DC
  Filled 2014-07-08: qty 1

## 2014-07-08 MED ORDER — ACETAMINOPHEN 650 MG RE SUPP
650.0000 mg | Freq: Four times a day (QID) | RECTAL | Status: DC | PRN
Start: 2014-07-08 — End: 2014-07-15

## 2014-07-08 MED ORDER — ONDANSETRON HCL 4 MG PO TABS: 8.0000 mg | ORAL_TABLET | Freq: Three times a day (TID) | ORAL | Status: DC | PRN

## 2014-07-08 MED ORDER — DEXTROSE 50 % IV SOLN
1.0000 | Freq: Once | INTRAVENOUS | Status: AC
  Administered 2014-07-08: 50 mL via INTRAVENOUS
  Filled 2014-07-08: qty 50

## 2014-07-08 MED ORDER — SODIUM CHLORIDE 0.9 % IJ SOLN
3.0000 mL | Freq: Two times a day (BID) | INTRAMUSCULAR | Status: DC
  Administered 2014-07-09 – 2014-07-15 (×8): 3 mL via INTRAVENOUS

## 2014-07-08 MED ORDER — INSULIN GLARGINE 100 UNIT/ML ~~LOC~~ SOLN
50.0000 [IU] | Freq: Every day | SUBCUTANEOUS | Status: DC
  Administered 2014-07-08: 50 [IU] via SUBCUTANEOUS
  Filled 2014-07-08 (×2): qty 0.5

## 2014-07-08 MED ORDER — ONDANSETRON HCL 4 MG PO TABS: 4.0000 mg | ORAL_TABLET | Freq: Four times a day (QID) | ORAL | Status: DC | PRN

## 2014-07-08 MED ORDER — FUROSEMIDE 40 MG PO TABS
40.0000 mg | ORAL_TABLET | ORAL | Status: DC
  Filled 2014-07-08: qty 1

## 2014-07-08 MED ORDER — OXYCODONE HCL 5 MG PO TABS
5.0000 mg | ORAL_TABLET | Freq: Once | ORAL | Status: AC
Start: 2014-07-08 — End: 2014-07-08
  Administered 2014-07-08: 5 mg via ORAL
  Filled 2014-07-08: qty 1

## 2014-07-08 MED ORDER — INSULIN ASPART 100 UNIT/ML ~~LOC~~ SOLN
0.0000 [IU] | Freq: Every day | SUBCUTANEOUS | Status: DC
  Administered 2014-07-08: 2 [IU] via SUBCUTANEOUS

## 2014-07-08 MED ORDER — SODIUM CHLORIDE 0.9 % IV SOLN
10.0000 mL/h | Freq: Once | INTRAVENOUS | Status: AC
  Administered 2014-07-08: 10 mL/h via INTRAVENOUS

## 2014-07-08 MED ORDER — GLIPIZIDE 5 MG PO TABS
5.0000 mg | ORAL_TABLET | Freq: Every day | ORAL | Status: DC
  Filled 2014-07-08 (×2): qty 1

## 2014-07-08 MED ORDER — PANTOPRAZOLE SODIUM 40 MG PO TBEC
40.0000 mg | DELAYED_RELEASE_TABLET | Freq: Every day | ORAL | Status: DC
  Administered 2014-07-08 – 2014-07-15 (×8): 40 mg via ORAL
  Filled 2014-07-08 (×8): qty 1

## 2014-07-08 MED ORDER — ALUM & MAG HYDROXIDE-SIMETH 200-200-20 MG/5ML PO SUSP: 30.0000 mL | Freq: Four times a day (QID) | ORAL | Status: DC | PRN

## 2014-07-08 MED ORDER — KETOROLAC TROMETHAMINE 30 MG/ML IJ SOLN
30.0000 mg | Freq: Once | INTRAMUSCULAR | Status: AC
  Administered 2014-07-08: 30 mg via INTRAVENOUS
  Filled 2014-07-08: qty 1

## 2014-07-08 MED ORDER — INSULIN ASPART 100 UNIT/ML ~~LOC~~ SOLN: 0.0000 [IU] | Freq: Three times a day (TID) | SUBCUTANEOUS | Status: DC

## 2014-07-08 MED ORDER — ONDANSETRON HCL 4 MG/2ML IJ SOLN
4.0000 mg | Freq: Four times a day (QID) | INTRAMUSCULAR | Status: DC | PRN
  Administered 2014-07-09: 4 mg via INTRAVENOUS
  Filled 2014-07-08: qty 2

## 2014-07-08 MED ORDER — SODIUM CHLORIDE 0.9 % IJ SOLN
3.0000 mL | INTRAMUSCULAR | Status: DC | PRN
Start: 2014-07-08 — End: 2014-07-15
  Administered 2014-07-13: 3 mL via INTRAVENOUS

## 2014-07-08 MED ORDER — SIMETHICONE 80 MG PO CHEW
80.0000 mg | CHEWABLE_TABLET | Freq: Once | ORAL | Status: AC
  Administered 2014-07-08: 80 mg via ORAL
  Filled 2014-07-08 (×2): qty 1

## 2014-07-08 MED ORDER — ACETAMINOPHEN 325 MG PO TABS: 650.0000 mg | ORAL_TABLET | Freq: Four times a day (QID) | ORAL | Status: DC | PRN

## 2014-07-08 MED ORDER — CYCLOBENZAPRINE HCL 10 MG PO TABS
10.0000 mg | ORAL_TABLET | Freq: Once | ORAL | Status: AC
  Administered 2014-07-08: 10 mg via ORAL
  Filled 2014-07-08: qty 1

## 2014-07-08 MED ORDER — ALLOPURINOL 150 MG HALF TABLET
150.0000 mg | ORAL_TABLET | Freq: Every day | ORAL | Status: DC
  Administered 2014-07-09 – 2014-07-15 (×7): 150 mg via ORAL
  Filled 2014-07-08 (×7): qty 1

## 2014-07-08 MED ORDER — OXYCODONE HCL 5 MG PO TABS
10.0000 mg | ORAL_TABLET | Freq: Once | ORAL | Status: AC
  Administered 2014-07-08: 10 mg via ORAL
  Filled 2014-07-08: qty 2

## 2014-07-08 MED ORDER — SODIUM CHLORIDE 0.9 % IV SOLN: 250.0000 mL | INTRAVENOUS | Status: DC | PRN

## 2014-07-08 MED ORDER — METOPROLOL TARTRATE 50 MG PO TABS
50.0000 mg | ORAL_TABLET | Freq: Two times a day (BID) | ORAL | Status: DC
  Administered 2014-07-08 – 2014-07-10 (×4): 50 mg via ORAL
  Filled 2014-07-08 (×5): qty 1

## 2014-07-08 MED ORDER — SIMETHICONE 80 MG PO CHEW
80.0000 mg | CHEWABLE_TABLET | Freq: Four times a day (QID) | ORAL | Status: DC | PRN
  Administered 2014-07-08 – 2014-07-15 (×5): 80 mg via ORAL
  Filled 2014-07-08 (×7): qty 1

## 2014-07-08 NOTE — ED Provider Notes (Signed)
CSN: [id number]     Arrival date & time 07/08/14  1241 History   First MD Initiated Contact with Patient 07/08/14 1311     Chief Complaint  Patient presents with  . Abnormal Lab     (Consider location/radiation/quality/duration/timing/severity/associated sxs/prior Treatment) HPI  This is a 73 year old with a history of diabetes, hypertension, multiple myeloma in relapse, nephrotic syndrome with chronic kidney injury who presents with abnormal labs. Patient was being evaluated for fistula placement for dialysis. Screening lab work showed anemia and thrombocytopenia. Patient reports generalized weakness and lightheadedness. He denies any other systemic symptoms including shortness of breath, chest pain, abdominal pain. He denies any bloody or dark stools. He was here for further evaluation. Patient denies any fevers.  Past Medical History  Diagnosis Date  . Diabetes mellitus   . Hypertension   . Multiple myeloma, in relapse   . Nephrotic syndrome in diabetes mellitus   . Fever, unspecified 10/04/2011  . Multiple myeloma, in relapse 10/04/2011  . Chronic renal insufficiency, stage III (moderate)     diabetes, Hypertension, nephrotic syndrome  . Gout   . Cellulitis and abscess of leg     diabetic  . Hernia of abdominal wall     ombelicus  . Gout 10/04/2011  . Fracture, vertebra, pathologic 12/23/2011  . Diabetic peripheral neuropathy 04/20/2012   Past Surgical History  Procedure Laterality Date  . Back surgery  2007 approx.    due to multiple myeloma   Family History  Problem Relation Age of Onset  . Hypertension Mother   . Diabetes Mother   . Cancer Brother   . Diabetes Son    History  Substance Use Topics  . Smoking status: Former Smoker -- 1.00 packs/day for 42 years    Types: Cigarettes    Start date: 08/21/1954    Quit date: 10/03/1996  . Smokeless tobacco: Current User    Types: Chew     Comment: quit smoking 17years ago  . Alcohol Use: No    Review of Systems   Constitutional: Negative for fever.  Respiratory: Negative.  Negative for chest tightness and shortness of breath.   Cardiovascular: Negative.  Negative for chest pain.  Gastrointestinal: Negative.  Negative for nausea, vomiting, abdominal pain and blood in stool.  Genitourinary: Negative.  Negative for dysuria.  Neurological: Positive for weakness and light-headedness. Negative for numbness and headaches.  All other systems reviewed and are negative.     Allergies  Codeine and Lorazepam  Home Medications   Prior to Admission medications   Medication Sig Start Date End Date Taking? Authorizing Provider  acyclovir (ZOVIRAX) 400 MG tablet Take 400 mg by mouth daily. 09/09/11  Yes Owens Shark, NP  allopurinol (ZYLOPRIM) 300 MG tablet Take 150 mg by mouth daily.    Yes Historical Provider, MD  famotidine (PEPCID) 20 MG tablet Take 20 mg by mouth 2 (two) times daily.   Yes Historical Provider, MD  furosemide (LASIX) 40 MG tablet Take 40 mg by mouth every other day.    Yes Historical Provider, MD  glipiZIDE (GLUCOTROL) 5 MG tablet Take 5 mg by mouth daily.     Yes Historical Provider, MD  insulin glargine (LANTUS) 100 UNIT/ML injection Inject 50 Units into the skin daily.  10/04/11  Yes Annia Belt, MD  metoprolol (LOPRESSOR) 50 MG tablet Take 50 mg by mouth 2 (two) times daily.     Yes Historical Provider, MD  ondansetron (ZOFRAN) 8 MG tablet Take 8 mg by  mouth every 8 (eight) hours as needed for nausea or vomiting (and before chemo).   Yes Historical Provider, MD  potassium chloride SA (K-DUR,KLOR-CON) 20 MEQ tablet Take 20 mEq by mouth daily.     Yes Historical Provider, MD  lisinopril (PRINIVIL,ZESTRIL) 40 MG tablet Take 40 mg by mouth daily.    Historical Provider, MD   BP 117/66  Pulse 79  Temp(Src) 98 F (36.7 C) (Oral)  Resp 16  SpO2 98% Physical Exam  Nursing note and vitals reviewed. Constitutional: He is oriented to person, place, and time. No distress.  Pale  appearing, chronically ill  HENT:  Head: Normocephalic and atraumatic.  Neck: Neck supple.  Cardiovascular: Normal rate, regular rhythm and normal heart sounds.   No murmur heard. Pulmonary/Chest: Effort normal and breath sounds normal. No respiratory distress. He has no wheezes.  Abdominal: Soft. Bowel sounds are normal. There is no tenderness. There is no rebound.  Protuberant abdomen  Musculoskeletal: He exhibits edema.  Neurological: He is alert and oriented to person, place, and time.  Skin: Skin is warm and dry.  Psychiatric: He has a normal mood and affect.    ED Course  Procedures (including critical care time)  CRITICAL CARE Performed by: Thayer Jew, F   Total critical care time: 35 min  Critical care time was exclusive of separately billable procedures and treating other patients.  Critical care was necessary to treat or prevent imminent or life-threatening deterioration.  Critical care was time spent personally by me on the following activities: development of treatment plan with patient and/or surrogate as well as nursing, discussions with consultants, evaluation of patient's response to treatment, examination of patient, obtaining history from patient or surrogate, ordering and performing treatments and interventions, ordering and review of laboratory studies, ordering and review of radiographic studies, pulse oximetry and re-evaluation of patient's condition.  Labs Review Labs Reviewed  CBC WITH DIFFERENTIAL - Abnormal; Notable for the following:    WBC 2.4 (*)    RBC 1.95 (*)    Hemoglobin 6.1 (*)    HCT 17.4 (*)    Platelets <5 (*)    Lymphs Abs 0.4 (*)    All other components within normal limits  BASIC METABOLIC PANEL - Abnormal; Notable for the following:    Glucose, Bld 51 (*)    BUN 78 (*)    Creatinine, Ser 6.17 (*)    Calcium 8.0 (*)    GFR calc non Af Amer 8 (*)    GFR calc Af Amer 9 (*)    All other components within normal limits   HEPATIC FUNCTION PANEL - Abnormal; Notable for the following:    Albumin 2.5 (*)    Total Bilirubin 0.2 (*)    All other components within normal limits  POC OCCULT BLOOD, ED - Abnormal; Notable for the following:    Fecal Occult Bld POSITIVE (*)    All other components within normal limits  PATHOLOGIST SMEAR REVIEW  CBG MONITORING, ED  TYPE AND SCREEN  ABO/RH  PREPARE RBC (CROSSMATCH)  PREPARE PLATELET PHERESIS    Imaging Review No results found.   EKG Interpretation None      MDM   Final diagnoses:  Pancytopenia  Acute on chronic kidney failure    Patient presents with abnormal lab work and generalized weakness and lightheadedness.  The patient reported to be anemic and thrombocytopenic. We'll repeat lab work here. He denies any systemic symptoms including fevers. Does endorse lightheadedness and generalized weakness. Is nonfocal  on exam. Pale appearing.  Vital signs stable. Hemoccult positive. Lab work notable for pancytopenia with leukopenia, anemia, and pancytopenia with platelets of less than 5. Patient was typed and screened. Oncology was consulted given patient's history of multiple I'm, and development of pancytopenia. Patient still does produce urine and there is no indication for emergent dialysis at this time although creatinine continues to increase. Potassium is 4.7. Patient was given one unit of packed red blood cells and platelets. Will admit for further evaluation and management.    Merryl Hacker, MD 07/09/14 2023

## 2014-07-08 NOTE — Progress Notes (Signed)
William Gaines   DOB:[birth year]   DG#:[id number]   RJJ#:[id number]  Subjective: William Gaines is a 73 year old [place] man with a history of kappa light chain  Multiple Myeloma under the care of Dr. Marin Gaines, who is being admitted for evaluation and management of pancytopenia.  Patient was last seen at the [facility] on 05/07/14, clinically stable. Labs were remarkable for chronic anemia with a Hb of 10.3, Platelets of 95k and normal WBC at 4.4.  He presented to the Emergency Department by his nephrologist in [facility], in preparation for left AV fistula on 9/2,  after his labs demonstrated pancytopenia as follows: Hb 6.1, platelets less than 5k and WBC 2.4 with ANC of 1.8 He denies any bleeding issues such as epistaxis, gum bleeding, hematemesis, hematuria or hematochezia. He did have a few pruritic areas in the pretibial area which caused him to scratch and bleed, now healing.  He denies any fever or chills, no night sweats. Appetite normal. No nausea or vomiting, but does have non-bloody diarrhea.  No shortness of breath or cardiac complaints. He has bilateral lower extremity swelling which is chronic. He denies any trips to the woods, or ticks. He does report sick contact with his relative about 2-3 weeks ago, who has the shingles. He denies any areas of open vesicles. In addition, he denies any headaches, but he does report left blurred vision for the last month. No confusion. No new meds or antibiotics. He is to be admitted by the Hospitalist team for supportive care and further workup including transfusions to normalize his lab values. Further labs from his nephrologist office are being faxed to the [facility].  Principle Diagnosis:  Encounter Diagnoses   Name  Primary?   .  Multiple myeloma, in relapse  Yes   .  Nephrotic syndrome    .  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  Initial treatment with palliative radiation.  Subsequent surgical stabilization procedure.  He went on to receive immunotherapy initially with Revlimid plus dexamethasone for 6 months.  Subsequent discontinuation of dexamethasone due to uncontrolled diabetes and infection.  He was followed until a new lytic lesion of his left iliac bone developed in March 2012. He received a course of palliative radiation He was started back on chemotherapy with a combination of subcutaneous Velcade plus oral melphalan in August 2012.  He achieved a nice partial response. Most recently he was put back on single agent Revlimid maintenance initially 10 mg daily beginning in August 2013.He has tolerated this well.This was decreased to 5 mg daily in view of his chronic renal insufficiency.   Revlimid on hold on approximately February 20 ,2015 when creatinine rose to 3.6.  He continued to have a normal kappa to lambda serum free light chain ratio again suggesting that his renal dysfunction is not due to myeloma but to medical renal disease.  His last bone scan in January of 2015 showed some lesions in the calvarium, in the posterior formal bone and the iliac crests. PET scan on 04/07/14 was negative for bone lesions, but did show a nodule in thyroid. Bone marrow biopsy on 04/15/14 was unremarkable. No myeloma protein in urine was noted. Observation was recommended.  Scheduled Meds:  Continuous Infusions: . sodium chloride    .  sodium chloride     PRN Meds:. Prior to Admission medications   Medication  Sig  Start Date  End Date  Taking?  Authorizing Provider   acyclovir (ZOVIRAX) 400 MG tablet  Take 400 mg by mouth daily.  09/09/11    William Shark, NP   allopurinol (ZYLOPRIM) 300 MG tablet  Take 150 mg by mouth daily.     Historical Provider, MD   aspirin 81 MG tablet  Take 81  mg by mouth daily.     Historical Provider, MD   doxazosin (CARDURA) 8 MG tablet  Take 8 mg by mouth at bedtime.     Historical Provider, MD   furosemide (LASIX) 40 MG tablet  Take 40 mg by mouth every other day.     Historical Provider, MD   glipiZIDE (GLUCOTROL) 5 MG tablet  Take 5 mg by mouth daily.     Historical Provider, MD   insulin glargine (LANTUS) 100 UNIT/ML injection  Inject 50 Units into the skin daily.  10/04/11    William Belt, MD   lisinopril (PRINIVIL,ZESTRIL) 40 MG tablet  Take 40 mg by mouth daily.     Historical Provider, MD   metoprolol (LOPRESSOR) 50 MG tablet  Take 50 mg by mouth 2 (two) times daily.     Historical Provider, MD   ondansetron (ZOFRAN) 8 MG tablet  TAKE ONE TABLET BY MOUTH BEFORE CHEMO, THEN EVERY 8 HOURS AS NEEDED FOR NAUSEA  04/21/14    William Napoleon, MD   potassium chloride SA (K-DUR,KLOR-CON) 20 MEQ tablet  Take 20 mEq by mouth daily.     Historical Provider, MD       Objective:  Filed Vitals:   07/08/14 1533  BP: 117/66  Pulse: 79  Temp:   Resp: 16     No intake or output data in the 24 hours ending 07/08/14 1548  ECOG PERFORMANCE STATUS:  1 Symptomatic but completely ambulatory (Restricted in physically strenuous activity but ambulatory and able to carry out work of a light or sedentary nature. For    GENERAL:alert, no distress and comfortable SKIN: skin color, texture, turgor are normal, no rashes, healing left lower extremity ulcer. right hip bruising at the bone marrow biopsy site. Questionable petechia in lower extremities EYES: normal, conjunctiva are pink and non-injected, sclera clear OROPHARYNX:no exudate, no erythema and lips, buccal mucosa, and tongue normal  NECK: supple, thyroid normal size, non-tender, without nodularity LYMPH:  no palpable lymphadenopathy in the cervical, axillary or inguinal LUNGS: clear to auscultation and percussion with normal breathing effort HEART: regular rate & rhythm and no murmurs and no  lower extremity edema ABDOMEN:abdomen soft, obese, non-tender and normal bowel sounds Musculoskeletal:no cyanosis of digits and no clubbing  PSYCH: alert & oriented x 3 with fluent speech NEURO: no focal motor/sensory deficits    CBG (last 3)  No results found for this basename: GLUCAP,  in the last 72 hours   Labs:   Recent Labs Lab 07/08/14 1315  WBC 2.4*  HGB 6.1*  HCT 17.4*  PLT <5*  MCV 89.2  MCH 31.3  MCHC 35.1  RDW 13.4  LYMPHSABS 0.4*  MONOABS 0.2  EOSABS 0.0  BASOSABS 0.0     Chemistries:    Recent Labs Lab 07/08/14 1315  NA 142  K 4.7  CL 108  CO2 20  GLUCOSE 51*  BUN 78*  CREATININE 6.17*  CALCIUM 8.0*  AST 20  ALT 16  ALKPHOS 63  BILITOT  0.2*    GFR The CrCl is unknown because both a height and weight (above a minimum accepted value) are required for this calculation.  Liver Function Tests:  Recent Labs Lab 07/08/14 1315  AST 20  ALT 16  ALKPHOS 63  BILITOT 0.2*  PROT 6.5  ALBUMIN 2.5*     Imaging Studies:  No results found.  Assessment/Plan: 73 y.o. 1.kappa light chain  Multiple Myeloma On observation only Latest PET scan and Bone Marrow biopsy were not indicative of progression of disease Further plans as per Dr. Marin Gaines  Pancytopenia See note below  3.Renal Failure patient was to have AVF placement on 9/2 for HD initiation May need Renal evaluation if remains in hospital  4. Diarrhea Non bloody Check stools for ova and parasites, C diff., as per primary team  Will follow with you  Other medical issues, acute or chronic, as per admitting team     **Disclaimer: This note was dictated with voice recognition software. Similar sounding words can inadvertently be transcribed and this note may contain transcription errors which may not have been corrected upon publication of note.** Rondel Jumbo, PA-C 07/08/2014  3:48 PM   ADDENDUM: 73 y/o [place] man followed by Dr William Gaines for a history of kappa light chain  myeloma. He was restaged 04/15/2014 with a bone marrow biopsy which showed no evidence of active myeloma (8% plasma cells staining for both kappa and lambda light chains), and no significant dyspoiesis. The marrow was hypercellular. PET scan 04/21/2014 showed stable lytic lesions, again no evidence of active disease. According to the records and patient, he is not on active treatment for his myeloma at present.  He was found to be severely pancytopenic today during evaluation for possible fistula placement with plans for eventual HD. Specifically WBC 2.4, ANC 1.8, Hb 6.1, MCV 89.2 and platelets <5K. Creatinine is 6.17, up from 4.5 2 months ago.  I reviewed the blood film which shows rouleaux but no schistocytes, confirms no platelets, and shows no blasts or circulating plasma cells; there is mild left shift.  Patient denies recent fever or bleeding, complains of upset stomach, belching, and loose BMs which are not black or bloody. Exam is not suggestive of a cause of his pancytopenia  (1) no evidence of TTP or DIC-- would proceed with platelet transfusion as planned, repeat CBC in AM  (2) he is making enough urine I don't believe he will be volume overloaded if he receives PRBCs, but would give each unti over 3 hours  (3) I believe he will need a bone marrow biopsy to find out why his fairly normal June marrow is failing. Will repeat myeloma labs, send iron, folate and B-12 studies. I will alert Dr William Gaines to patient's admission.  I personally saw this patient and performed a substantive portion of this encounter with the listed APP documented above.   Chauncey Cruel, MD

## 2014-07-08 NOTE — ED Notes (Addendum)
Per Dyann Kief MD, Pt coming in due to abnormal lab values.  Hx multiple myeloma and chronic kidney disease.

## 2014-07-08 NOTE — ED Notes (Signed)
CBG 44. MD notified. Pt given 16 ounces of orange juice. Pt alert and oriented x4. Pt appears pale. Pt denies SOB or CP but reports generalized weakness. Pt reports has had diarrhea but denies blood in stool.

## 2014-07-08 NOTE — ED Notes (Addendum)
Pt states he feels better than when he arrived.   Pt CBG 35 and repeated CBG 38 on opposite hand. Horton MD notified. Pt given 16 ounces of orange juice and ham sandwich. Pt assymptomic.

## 2014-07-08 NOTE — ED Notes (Signed)
William Gaines from lab reports can add on hepatic function panel to blood already in lab. William Gaines denies lab needing any additional for running this order.

## 2014-07-08 NOTE — ED Notes (Addendum)
Pt was sent over for abnormal labs received a call from PCP and they said pt had a low HGB of 6 and low PLTS. Pt pale and weak. Alert x4, Pt is suppose to have a fistula for dialysis placed tomorrow.

## 2014-07-08 NOTE — ED Notes (Signed)
Pt diarrhea using bedside commode. Stool sample collected from output; okay per Horton MD.

## 2014-07-08 NOTE — H&P (Signed)
[facility] History and Physical  William Gaines HYW:[id number] DOB: [birth year] DOA: 07/08/2014   PCP: Cristine Polio, MD  Specialists: Dr Marin Olp  Chief Complaint:   HPI: William Gaines is a 73 y.o. male with multiple myeloma (recenty in remission), DM, HTN, and CKD 4-5. His last bone marrow biopsy was in June of this year and did not reveal an "active" myeloma marrow and he was being follow off of chemotherapy. He also has CKD and follows with renal regularly. He is sent to the hospital today due to abnormal blood work and is noted to be pancytopenic. He admits to feeling fatigued but has not had any issues with bleeding.    General: The patient denies anorexia, fever, weight loss Cardiac: Denies chest pain, syncope, palpitations, pedal edema  Respiratory: Denies cough, shortness of breath, wheezing GI: Denies severe indigestion/heartburn, abdominal pain, nausea, vomiting, diarrhea and constipation- currently however he has severe belching which is almost constant while I examine him.  GU: Denies hematuria, incontinence, dysuria  Musculoskeletal: Denies arthritis  Skin: Denies suspicious skin lesions Neurologic: Denies focal weakness or numbness, change in vision  Past Medical History  Diagnosis Date  . Diabetes mellitus   . Hypertension   . Nephrotic syndrome in diabetes mellitus   . Fever, unspecified 10/04/2011  . Chronic renal insufficiency, stage III (moderate)     diabetes, Hypertension, nephrotic syndrome  . Gout   . Cellulitis and abscess of leg     diabetic  . Hernia of abdominal wall     ombelicus  . Gout 10/04/2011  . Fracture, vertebra, pathologic 12/23/2011  . Diabetic peripheral neuropathy 04/20/2012  . Multiple myeloma, in relapse   . Multiple myeloma, in relapse 10/04/2011    Past Surgical History  Procedure Laterality Date  . Back surgery  2007 approx.    due to multiple myeloma    Social History: does not smoke or drink- state he quit "long  time" ago Lives at home with family.     Allergies  Allergen Reactions  . Codeine     hallucinations  . Lorazepam [Lorazepam] Anxiety    Paradoxical agitation when given as pre med for bone marrow biopsy 09/11/06    Family History  Problem Relation Age of Onset  . Hypertension Mother   . Diabetes Mother   . Cancer Brother   . Diabetes Son       Prior to Admission medications   Medication Sig Start Date End Date Taking? Authorizing Provider  acyclovir (ZOVIRAX) 400 MG tablet Take 400 mg by mouth daily. 09/09/11   Owens Shark, NP  allopurinol (ZYLOPRIM) 300 MG tablet Take 150 mg by mouth daily.     Historical Provider, MD  aspirin 81 MG tablet Take 81 mg by mouth daily.    Historical Provider, MD  doxazosin (CARDURA) 8 MG tablet Take 8 mg by mouth at bedtime.      Historical Provider, MD  furosemide (LASIX) 40 MG tablet Take 40 mg by mouth every other day.     Historical Provider, MD  glipiZIDE (GLUCOTROL) 5 MG tablet Take 5 mg by mouth daily.      Historical Provider, MD  insulin glargine (LANTUS) 100 UNIT/ML injection Inject 50 Units into the skin daily.   10/04/11   Annia Belt, MD  lisinopril (PRINIVIL,ZESTRIL) 40 MG tablet Take 40 mg by mouth daily.    Historical Provider, MD  metoprolol (LOPRESSOR) 50 MG tablet Take 50 mg by mouth 2 (two) times daily.  Historical Provider, MD  ondansetron (ZOFRAN) 8 MG tablet TAKE ONE TABLET BY MOUTH BEFORE CHEMO, THEN EVERY 8 HOURS AS NEEDED FOR NAUSEA 04/21/14   Volanda Napoleon, MD  potassium chloride SA (K-DUR,KLOR-CON) 20 MEQ tablet Take 20 mEq by mouth daily.      Historical Provider, MD     Physical Exam: Filed Vitals:   07/08/14 1353 07/08/14 1533 07/08/14 1709 07/08/14 1735  BP: 141/66 117/66 148/68 160/65  Pulse: 86 79 93 99  Temp: 98 F (36.7 C)   98.2 F (36.8 C)  TempSrc: Oral   Oral  Resp: $Remo'18 16 18 20  'CbESv$ Height:    '5\' 8"'$  (1.727 m)  Weight:    114.306 kg (252 lb)  SpO2: 99% 98% 100% 100%     General: AAO x  3, no distress HEENT: Normocephalic and Atraumatic, Mucous membranes pink                PERRLA; EOM intact; No scleral icterus,                 Nares: Patent, Oropharynx: Clear, Fair Dentition                 Neck: FROM, no cervical lymphadenopathy, thyromegaly, carotid bruit or JVD;  Breasts: deferred CHEST WALL: No tenderness  CHEST: Normal respiration, clear to auscultation bilaterally  HEART: Regular rate and rhythm; no murmurs rubs or gallops  BACK: No kyphosis or scoliosis; no CVA tenderness  ABDOMEN: Positive Bowel Sounds, soft, non-tender; no masses, no organomegaly Rectal Exam: deferred EXTREMITIES: No cyanosis, clubbing, or edema Genitalia: not examined  SKIN:  no rash or ulceration  CNS: Alert and Oriented x 4, Nonfocal exam, CN 2-12 intact  Labs on Admission:  Basic Metabolic Panel:  Recent Labs Lab 07/08/14 1315  NA 142  K 4.7  CL 108  CO2 20  GLUCOSE 51*  BUN 78*  CREATININE 6.17*  CALCIUM 8.0*   Liver Function Tests:  Recent Labs Lab 07/08/14 1315  AST 20  ALT 16  ALKPHOS 63  BILITOT 0.2*  PROT 6.5  ALBUMIN 2.5*   No results found for this basename: LIPASE, AMYLASE,  in the last 168 hours No results found for this basename: AMMONIA,  in the last 168 hours CBC:  Recent Labs Lab 07/08/14 1315  WBC 2.4*  NEUTROABS 1.8  HGB 6.1*  HCT 17.4*  MCV 89.2  PLT <5*   Cardiac Enzymes: No results found for this basename: CKTOTAL, CKMB, CKMBINDEX, TROPONINI,  in the last 168 hours  BNP (last 3 results) No results found for this basename: PROBNP,  in the last 8760 hours CBG: No results found for this basename: GLUCAP,  in the last 168 hours  Radiological Exams on Admission: No results found.    Assessment/Plan Principal Problem: Pancytopenia - WBC 2.4, Hgb 6/1 and Platelets < 5 - will receive one unit of PRBC and one unit of platelets - it is noted that he is on Pepcid at home which can cause thrombocytopenia- will hold this - he on  Acyclovir (he thinks he is still taking it at home) for herpes prophylaxis,  which can cause thrombocytopenia-will hold this  - Allopurinol is also known to cause myelosuppression, therefore, will hold it as well - question if anemia is related to his CKD- apparently has not been started on any erythropoietin yet- will obtain anemia panel and DIC panel - Hematology is considering obtaining another bone marrow biopsy to investigate the pancytopenia -  Dr Marin Olp to follow  Active Problems:   Diabetes mellitus due to underlying condition with diabetic polyneuropathy - cont Lantus, Glipizide and start sliding scale  FOB + - cannot use Pepcid- will add Protonix for possible gastritis - cont to follow - may resolve if platelet count improves    Hypertension  - cont Metoprolol - add PRN Labetalol    Multiple myeloma, in relapse - per oncology      AKI (acute kidney injury)/ Chronic renal insufficiency, stage IV (severe) - cr has risen from 4.5 in July to 6.17 today - currently still making urine - cont to - follow I and O  - hold Lisinopril for now- cont Lasix - was being evaluated for dialysis access at outpatient- currently does not appear to need dialysis   Indigestion  - start Simethicone and Mylanta PRN   Consulted: hematology  Code Status: Full code Family Communication: daughter at bedside  DVT Prophylaxis:SCDs  Time spent: >45 min  [place], MD [facility]  If 7PM-7AM, please contact night-coverage [email or web address] 07/08/2014, 6:26 PM

## 2014-07-09 ENCOUNTER — Inpatient Hospital Stay (HOSPITAL_COMMUNITY): Payer: Medicare Other

## 2014-07-09 DIAGNOSIS — R197 Diarrhea, unspecified: Secondary | ICD-10-CM | POA: Diagnosis present

## 2014-07-09 DIAGNOSIS — E118 Type 2 diabetes mellitus with unspecified complications: Secondary | ICD-10-CM

## 2014-07-09 DIAGNOSIS — D649 Anemia, unspecified: Secondary | ICD-10-CM

## 2014-07-09 DIAGNOSIS — N189 Chronic kidney disease, unspecified: Secondary | ICD-10-CM

## 2014-07-09 DIAGNOSIS — I998 Other disorder of circulatory system: Secondary | ICD-10-CM

## 2014-07-09 DIAGNOSIS — N179 Acute kidney failure, unspecified: Secondary | ICD-10-CM | POA: Diagnosis present

## 2014-07-09 DIAGNOSIS — N289 Disorder of kidney and ureter, unspecified: Secondary | ICD-10-CM

## 2014-07-09 DIAGNOSIS — E875 Hyperkalemia: Secondary | ICD-10-CM

## 2014-07-09 LAB — BASIC METABOLIC PANEL
Anion gap: 13 (ref 5–15)
Anion gap: 15 (ref 5–15)
BUN: 81 mg/dL — ABNORMAL HIGH (ref 6–23)
BUN: 83 mg/dL — ABNORMAL HIGH (ref 6–23)
CHLORIDE: 107 meq/L (ref 96–112)
CO2: 19 mEq/L (ref 19–32)
CO2: 19 mEq/L (ref 19–32)
Calcium: 7.7 mg/dL — ABNORMAL LOW (ref 8.4–10.5)
Calcium: 7.9 mg/dL — ABNORMAL LOW (ref 8.4–10.5)
Chloride: 106 mEq/L (ref 96–112)
Creatinine, Ser: 6.43 mg/dL — ABNORMAL HIGH (ref 0.50–1.35)
Creatinine, Ser: 6.87 mg/dL — ABNORMAL HIGH (ref 0.50–1.35)
GFR calc Af Amer: 9 mL/min — ABNORMAL LOW (ref >90)
GFR calc non Af Amer: 7 mL/min — ABNORMAL LOW (ref >90)
GFR, EST AFRICAN AMERICAN: 8 mL/min — AB (ref >90)
GFR, EST NON AFRICAN AMERICAN: 8 mL/min — AB (ref >90)
GLUCOSE: 102 mg/dL — AB (ref 70–99)
Glucose, Bld: 135 mg/dL — ABNORMAL HIGH (ref 70–99)
POTASSIUM: 6.6 meq/L — AB (ref 3.7–5.3)
POTASSIUM: 5.3 meq/L (ref 3.7–5.3)
SODIUM: 138 meq/L (ref 137–147)
SODIUM: 141 meq/L (ref 137–147)

## 2014-07-09 LAB — GI PATHOGEN PANEL BY PCR, STOOL
C difficile toxin A/B: NEGATIVE
Campylobacter by PCR: NEGATIVE
Cryptosporidium by PCR: NEGATIVE
E COLI (STEC): NEGATIVE
E coli (ETEC) LT/ST: NEGATIVE
E coli 0157 by PCR: NEGATIVE
G lamblia by PCR: NEGATIVE
Norovirus GI/GII: NEGATIVE
ROTAVIRUS A BY PCR: NEGATIVE
SALMONELLA BY PCR: NEGATIVE
SHIGELLA BY PCR: NEGATIVE

## 2014-07-09 LAB — CK: Total CK: 1727 U/L — ABNORMAL HIGH (ref 7–232)

## 2014-07-09 LAB — IRON AND TIBC
Iron: 117 ug/dL (ref 42–135)
Saturation Ratios: 51 % (ref 20–55)
TIBC: 228 ug/dL (ref 215–435)
UIBC: 111 ug/dL — ABNORMAL LOW (ref 125–400)

## 2014-07-09 LAB — GLUCOSE, CAPILLARY
GLUCOSE-CAPILLARY: 109 mg/dL — AB (ref 70–99)
GLUCOSE-CAPILLARY: 98 mg/dL (ref 70–99)
Glucose-Capillary: 44 mg/dL — CL (ref 70–99)
Glucose-Capillary: 38 mg/dL — CL (ref 70–99)
Glucose-Capillary: 93 mg/dL (ref 70–99)
Glucose-Capillary: 198 mg/dL — ABNORMAL HIGH (ref 70–99)

## 2014-07-09 LAB — CBC
HCT: 18.9 % — ABNORMAL LOW (ref 39.0–52.0)
HEMATOCRIT: 21.9 % — AB (ref 39.0–52.0)
HEMOGLOBIN: 7.6 g/dL — AB (ref 13.0–17.0)
Hemoglobin: 6.6 g/dL — CL (ref 13.0–17.0)
MCH: 31.6 pg (ref 26.0–34.0)
MCH: 30.9 pg (ref 26.0–34.0)
MCHC: 34.9 g/dL (ref 30.0–36.0)
MCHC: 34.7 g/dL (ref 30.0–36.0)
MCV: 90.4 fL (ref 78.0–100.0)
MCV: 89 fL (ref 78.0–100.0)
PLATELETS: 14 10*3/uL — AB (ref 150–400)
Platelets: 11 10*3/uL — CL (ref 150–400)
RBC: 2.09 MIL/uL — AB (ref 4.22–5.81)
RBC: 2.46 MIL/uL — ABNORMAL LOW (ref 4.22–5.81)
RDW: 13.5 % (ref 11.5–15.5)
RDW: 14 % (ref 11.5–15.5)
WBC: 2.7 10*3/uL — AB (ref 4.0–10.5)
WBC: 3.6 10*3/uL — AB (ref 4.0–10.5)

## 2014-07-09 LAB — ABO/RH: ABO/RH(D): O POS

## 2014-07-09 LAB — CLOSTRIDIUM DIFFICILE BY PCR: Toxigenic C. Difficile by PCR: NEGATIVE

## 2014-07-09 LAB — PREPARE PLATELET PHERESIS: Unit division: 0

## 2014-07-09 LAB — URIC ACID: Uric Acid, Serum: 7.4 mg/dL (ref 4.0–7.8)

## 2014-07-09 LAB — RETICULOCYTES
RBC.: 2.09 MIL/uL — AB (ref 4.22–5.81)
Retic Count, Absolute: 8.4 10*3/uL — ABNORMAL LOW (ref 19.0–186.0)
Retic Ct Pct: 0.4 % (ref 0.4–3.1)

## 2014-07-09 LAB — KAPPA/LAMBDA LIGHT CHAINS
KAPPA FREE LGHT CHN: 23.5 mg/dL — AB (ref 0.33–1.94)
KAPPA, LAMDA LIGHT CHAIN RATIO: 2.12 — AB (ref 0.26–1.65)
Lambda free light chains: 11.1 mg/dL — ABNORMAL HIGH (ref 0.57–2.63)

## 2014-07-09 LAB — LACTATE DEHYDROGENASE
LDH: 378 U/L — ABNORMAL HIGH (ref 94–250)
LDH: 528 U/L — ABNORMAL HIGH (ref 94–250)

## 2014-07-09 LAB — VITAMIN B12: Vitamin B-12: 958 pg/mL — ABNORMAL HIGH (ref 211–911)

## 2014-07-09 LAB — FERRITIN: FERRITIN: 360 ng/mL — AB (ref 22–322)

## 2014-07-09 LAB — PREPARE RBC (CROSSMATCH)

## 2014-07-09 LAB — FOLATE: Folate: 16.4 ng/mL

## 2014-07-09 LAB — PATHOLOGIST SMEAR REVIEW

## 2014-07-09 MED ORDER — STERILE WATER FOR INJECTION IV SOLN
INTRAVENOUS | Status: DC
  Administered 2014-07-09 – 2014-07-10 (×3): via INTRAVENOUS
  Filled 2014-07-09 (×7): qty 850

## 2014-07-09 MED ORDER — OXYCODONE HCL 5 MG PO TABS: 10.0000 mg | ORAL_TABLET | ORAL | Status: DC | PRN

## 2014-07-09 MED ORDER — INSULIN GLARGINE 100 UNIT/ML ~~LOC~~ SOLN
40.0000 [IU] | Freq: Every day | SUBCUTANEOUS | Status: DC
  Administered 2014-07-09 – 2014-07-11 (×3): 40 [IU] via SUBCUTANEOUS
  Filled 2014-07-09 (×3): qty 0.4

## 2014-07-09 MED ORDER — SODIUM POLYSTYRENE SULFONATE 15 GM/60ML PO SUSP
45.0000 g | Freq: Once | ORAL | Status: AC
  Administered 2014-07-09: 45 g via ORAL
  Filled 2014-07-09: qty 180

## 2014-07-09 MED ORDER — INSULIN ASPART 100 UNIT/ML ~~LOC~~ SOLN
0.0000 [IU] | Freq: Three times a day (TID) | SUBCUTANEOUS | Status: DC
  Administered 2014-07-10 – 2014-07-11 (×2): 2 [IU] via SUBCUTANEOUS

## 2014-07-09 MED ORDER — SODIUM CHLORIDE 0.9 % IV SOLN: Freq: Once | INTRAVENOUS | Status: DC

## 2014-07-09 NOTE — Progress Notes (Signed)
CRITICAL VALUE ALERT  Critical value received:  Hgb 6.6 Potassium 6.6  Date of notification:  07/09/14  Time of notification:  1975  Critical value read back:Yes.    Nurse who received alert:  J.Damaria Stofko  MD notified (1st page):  K.Schorr  Time of first page:  0543  MD notified (2nd page):  Time of second page:  Responding MD:  K.Schorr  Time MD responded:  [phone]

## 2014-07-09 NOTE — Progress Notes (Signed)
Patient ID: William Gaines, male   DOB: 29-Nov-1940, 72 y.o.   MRN: [id number] Request received for CT guided bone marrow biopsy on pt with hx multiple myeloma/pancytopenia. Pt has hgb 6.6 today and has received transfusion; also with K of 6.6 and received kayexalate. Currently with loose stools. Details/risks of BM bx d/w pt (he has had BM bx in past). He does not wish to be sedated for procedure. Due to excessive loose stools this am and need for transfusion will tent plan bx for 9/3. Consent signed.

## 2014-07-09 NOTE — Consult Note (Signed)
Reason for Consult:CKD5,,AKI Referring Physician: Dr.Hongalgi  William Gaines is an 73 y.o. male.  HPI: 73 yr male with hx MM since 2007 with hx vert involvement, stabilization, tx with variety of agents including Dex, Revelemid,Velcade, and Radrx.  Last tx about a Yr ago.  Hx of BM 6/15 with 8% PC, thought to be in remission.        Hx >73yrDM, HTN, and has NS, with progressive Renal insuffic [facility]  Was to get access in [place] today.  Cr here 6.2 and 6.4. But in early July 4.5.  K normal yest but 6.6 today after transfusions, and toradol..  On Kdur outpatient, and Lisinopril.  Admitted for pancytopenia.        Notes chronic ankle edema, SOB with about 20 ft, bad taste in mouth, N with occ V, D after eating, 2-3 loose BM/d.  Bruising and bleeding, anorexia, cough with clear phlegm, poor energy.     Hx gout but no activity for many yr. Constitutional: as above Eyes: poor vision on L, has not seen opthal in over 3 yr Ears, nose, mouth, throat, and face: sore in mouth where bit cheek Respiratory: see above, quit smoking over 20 yr ago, no hx asthma Cardiovascular: edema of LE, DOE Gastrointestinal: see above Genitourinary:signif hesitancy Integument/breast: bruising and bleeding Hematologic/lymphatic: as above Musculoskeletal:back primary issue Neurological: numb feet Endocrine: DM as above Allergic/Immunologic: Codiene , Lorazepam Hx gout inactive  Primary Nephrologist Stovall. ..  Past Medical History  Diagnosis Date  . Diabetes mellitus   . Hypertension   . Nephrotic syndrome in diabetes mellitus   . Fever, unspecified 10/04/2011  . Chronic renal insufficiency, stage III (moderate)     diabetes, Hypertension, nephrotic syndrome  . Gout   . Cellulitis and abscess of leg     diabetic  . Hernia of abdominal wall     ombelicus  . Gout 10/04/2011  . Fracture, vertebra, pathologic 12/23/2011  . Diabetic peripheral neuropathy 04/20/2012  . Multiple  myeloma, in relapse   . Multiple myeloma, in relapse 10/04/2011    Past Surgical History  Procedure Laterality Date  . Back surgery  2007 approx.    due to multiple myeloma    Family History  Problem Relation Age of Onset  . Hypertension Mother   . Diabetes Mother   . Cancer Brother   . Diabetes Son     Social History:  reports that he quit smoking about 17 years ago. His smoking use included Cigarettes. He started smoking about 59 years ago. He has a 42 pack-year smoking history. His smokeless tobacco use includes Chew. He reports that he does not drink alcohol or use illicit drugs.  Allergies:  Allergies  Allergen Reactions  . Codeine     hallucinations  . Lorazepam [Lorazepam] Anxiety    Paradoxical agitation when given as pre med for bone marrow biopsy 09/11/06    Medications:  I have reviewed the patient's current medications. Prior to Admission:  Prescriptions prior to admission  Medication Sig Dispense Refill  . acyclovir (ZOVIRAX) 400 MG tablet Take 400 mg by mouth daily.      .Marland Kitchenallopurinol (ZYLOPRIM) 300 MG tablet Take 150 mg by mouth daily.       . famotidine (PEPCID) 20 MG tablet Take 20 mg by mouth 2 (two) times daily.      . furosemide (LASIX) 40 MG tablet Take 40 mg by mouth every other day.       .Marland Kitchen  glipiZIDE (GLUCOTROL) 5 MG tablet Take 5 mg by mouth daily.        . insulin glargine (LANTUS) 100 UNIT/ML injection Inject 50 Units into the skin daily.       . metoprolol (LOPRESSOR) 50 MG tablet Take 50 mg by mouth 2 (two) times daily.        . ondansetron (ZOFRAN) 8 MG tablet Take 8 mg by mouth every 8 (eight) hours as needed for nausea or vomiting (and before chemo).      . potassium chloride SA (K-DUR,KLOR-CON) 20 MEQ tablet Take 20 mEq by mouth daily.        Marland Kitchen lisinopril (PRINIVIL,ZESTRIL) 40 MG tablet Take 40 mg by mouth daily.        Results for orders placed during the hospital encounter of 07/08/14 (from the past 48 hour(s))  CBC WITH DIFFERENTIAL      Status: Abnormal   Collection Time    07/08/14  1:15 PM      Result Value Ref Range   WBC 2.4 (*) 4.0 - 10.5 K/uL   RBC 1.95 (*) 4.22 - 5.81 MIL/uL   Hemoglobin 6.1 (*) 13.0 - 17.0 g/dL   Comment: CRITICAL RESULT CALLED TO, READ BACK BY AND VERIFIED WITH:     HAMBY,J. _0  ON 07/08/14 BY MCCOY,N.   HCT 17.4 (*) 39.0 - 52.0 %   MCV 89.2  78.0 - 100.0 fL   MCH 31.3  26.0 - 34.0 pg   MCHC 35.1  30.0 - 36.0 g/dL   RDW 13.4  11.5 - 15.5 %   Platelets <5 (*) 150 - 400 K/uL   Comment: SPECIMEN CHECKED FOR CLOTS     REPEATED TO VERIFY     PLATELET COUNT CONFIRMED BY SMEAR     CRITICAL RESULT CALLED TO, READ BACK BY AND VERIFIED WITH:     HAMBY,J. _1  ON 07/08/14 BY MCCOY,N.   Neutrophils Relative % 72  43 - 77 %   Lymphocytes Relative 17  12 - 46 %   Monocytes Relative 10  3 - 12 %   Eosinophils Relative 1  0 - 5 %   Basophils Relative 0  0 - 1 %   Neutro Abs 1.8  1.7 - 7.7 K/uL   Lymphs Abs 0.4 (*) 0.7 - 4.0 K/uL   Monocytes Absolute 0.2  0.1 - 1.0 K/uL   Eosinophils Absolute 0.0  0.0 - 0.7 K/uL   Basophils Absolute 0.0  0.0 - 0.1 K/uL   WBC Morphology       Value: MODERATE LEFT SHIFT (>5% METAS AND MYELOS,OCC PRO NOTED)   Comment: ATYPICAL LYMPHOCYTES  BASIC METABOLIC PANEL     Status: Abnormal   Collection Time    07/08/14  1:15 PM      Result Value Ref Range   Sodium 142  137 - 147 mEq/L   Potassium 4.7  3.7 - 5.3 mEq/L   Chloride 108  96 - 112 mEq/L   CO2 20  19 - 32 mEq/L   Glucose, Bld 51 (*) 70 - 99 mg/dL   BUN 78 (*) 6 - 23 mg/dL   Creatinine, Ser 6.17 (*) 0.50 - 1.35 mg/dL   Calcium 8.0 (*) 8.4 - 10.5 mg/dL   GFR calc non Af Amer 8 (*) >90 mL/min   GFR calc Af Amer 9 (*) >90 mL/min   Comment: (NOTE)     The eGFR has been calculated using the CKD EPI equation.     This  calculation has not been validated in all clinical situations.     eGFR's persistently <90 mL/min signify possible Chronic Kidney     Disease.   Anion gap 14  5 - 15  ABO/RH     Status: None    Collection Time    07/08/14  1:15 PM      Result Value Ref Range   ABO/RH(D) O POS    HEPATIC FUNCTION PANEL     Status: Abnormal   Collection Time    07/08/14  1:15 PM      Result Value Ref Range   Total Protein 6.5  6.0 - 8.3 g/dL   Albumin 2.5 (*) 3.5 - 5.2 g/dL   AST 20  0 - 37 U/L   ALT 16  0 - 53 U/L   Alkaline Phosphatase 63  39 - 117 U/L   Total Bilirubin 0.2 (*) 0.3 - 1.2 mg/dL   Bilirubin, Direct <0.2  0.0 - 0.3 mg/dL   Indirect Bilirubin NOT CALCULATED  0.3 - 0.9 mg/dL  TYPE AND SCREEN     Status: None   Collection Time    07/08/14  1:21 PM      Result Value Ref Range   ABO/RH(D) O POS     Antibody Screen NEG     Sample Expiration 07/11/2014     Unit Number [id number]     Blood Component Type RED CELLS,LR     Unit division 00     Status of Unit ISSUED,FINAL     Transfusion Status OK TO TRANSFUSE     Crossmatch Result Compatible     Unit Number [id number]     Blood Component Type RED CELLS,LR     Unit division 00     Status of Unit ISSUED     Transfusion Status OK TO TRANSFUSE     Crossmatch Result Compatible    GLUCOSE, CAPILLARY     Status: Abnormal   Collection Time    07/08/14  1:41 PM      Result Value Ref Range   Glucose-Capillary 44 (*) 70 - 99 mg/dL  POC OCCULT BLOOD, ED     Status: Abnormal   Collection Time    07/08/14  2:57 PM      Result Value Ref Range   Fecal Occult Bld POSITIVE (*) NEGATIVE  PREPARE RBC (CROSSMATCH)     Status: None   Collection Time    07/08/14  3:39 PM      Result Value Ref Range   Order Confirmation ORDER PROCESSED BY BLOOD BANK    PREPARE PLATELET PHERESIS     Status: None   Collection Time    07/08/14  3:39 PM      Result Value Ref Range   Unit Number [id number]     Blood Component Type PLTPHER LR3     Unit division 00     Status of Unit ISSUED,FINAL     Transfusion Status OK TO TRANSFUSE    GLUCOSE, CAPILLARY     Status: Abnormal   Collection Time    07/08/14  4:54 PM      Result Value Ref Range    Glucose-Capillary 35 (*) 70 - 99 mg/dL   Comment 1 Documented in Chart     Comment 2 Repeat Test     Comment 3 Notify RN    GLUCOSE, CAPILLARY     Status: Abnormal   Collection Time    07/08/14  4:56 PM  Result Value Ref Range   Glucose-Capillary 38 (*) 70 - 99 mg/dL   Comment 1 Documented in Chart     Comment 2 Notify RN    GLUCOSE, CAPILLARY     Status: Abnormal   Collection Time    07/08/14  5:43 PM      Result Value Ref Range   Glucose-Capillary 159 (*) 70 - 99 mg/dL  KAPPA/LAMBDA LIGHT CHAINS     Status: Abnormal   Collection Time    07/08/14  6:40 PM      Result Value Ref Range   Kappa free light chain 23.50 (*) 0.33 - 1.94 mg/dL   Lamda free light chains 11.10 (*) 0.57 - 2.63 mg/dL   Kappa, lamda light chain ratio 2.12 (*) 0.26 - 1.65   Comment: Performed at [place]     Status: None   Collection Time    07/08/14  6:40 PM      Result Value Ref Range   Folate 16.4     Comment: (NOTE)     Reference Ranges            Deficient:       0.4 - 3.3 ng/mL            Indeterminate:   3.4 - 5.4 ng/mL            Normal:              > 5.4 ng/mL     Performed at [facility]  VITAMIN B12     Status: Abnormal   Collection Time    07/08/14  6:40 PM      Result Value Ref Range   Vitamin B-12 958 (*) 211 - 911 pg/mL   Comment: Performed at [facility]     Status: Abnormal   Collection Time    07/08/14  6:40 PM      Result Value Ref Range   Ferritin 360 (*) 22 - 322 ng/mL   Comment: Performed at [place] (DISSEMINATED INTRAVASCULAR COAGULATION) PANEL     Status: Abnormal   Collection Time    07/08/14  6:40 PM      Result Value Ref Range   Prothrombin Time 16.6 (*) 11.6 - 15.2 seconds   INR 1.34  0.00 - 1.49   aPTT 31  24 - 37 seconds   Fibrinogen 506 (*) 204 - 475 mg/dL   D-Dimer, Quant 0.88 (*) 0.00 - 0.48 ug/mL-FEU   Comment:            AT THE INHOUSE ESTABLISHED CUTOFF     VALUE OF 0.48 ug/mL FEU,      THIS ASSAY HAS BEEN DOCUMENTED     IN THE LITERATURE TO HAVE     A SENSITIVITY AND NEGATIVE     PREDICTIVE VALUE OF AT LEAST     98 TO 99%.  THE TEST RESULT     SHOULD BE CORRELATED WITH     AN ASSESSMENT OF THE CLINICAL     PROBABILITY OF DVT / VTE.   Platelets 6 (*) 150 - 400 K/uL   Comment: SPECIMEN CHECKED FOR CLOTS     REPEATED TO VERIFY     CRITICAL VALUE NOTED.  VALUE IS CONSISTENT WITH PREVIOUSLY REPORTED AND CALLED VALUE.   Smear Review NO SCHISTOCYTES SEEN    RETICULOCYTES     Status: Abnormal   Collection Time    07/08/14  6:40 PM      Result Value Ref Range   Retic Ct Pct 0.4  0.4 - 3.1 %   RBC. 2.04 (*) 4.22 - 5.81 MIL/uL   Retic Count, Manual 8.2 (*) 19.0 - 186.0 K/uL  GLUCOSE, CAPILLARY     Status: Abnormal   Collection Time    07/08/14  9:32 PM      Result Value Ref Range   Glucose-Capillary 222 (*) 70 - 99 mg/dL   Comment 1 Documented in Chart     Comment 2 Notify RN    BASIC METABOLIC PANEL     Status: Abnormal   Collection Time    07/09/14  4:31 AM      Result Value Ref Range   Sodium 138  137 - 147 mEq/L   Potassium 6.6 (*) 3.7 - 5.3 mEq/L   Comment: REPEATED TO VERIFY     NO VISIBLE HEMOLYSIS     CRITICAL RESULT CALLED TO, READ BACK BY AND VERIFIED WITH:     J.RIMANDO,RN AT 0543 ON 07/09/14 BY SHEAW     DELTA CHECK NOTED   Chloride 106  96 - 112 mEq/L   CO2 19  19 - 32 mEq/L   Glucose, Bld 135 (*) 70 - 99 mg/dL   BUN 81 (*) 6 - 23 mg/dL   Creatinine, Ser 6.43 (*) 0.50 - 1.35 mg/dL   Calcium 7.7 (*) 8.4 - 10.5 mg/dL   GFR calc non Af Amer 8 (*) >90 mL/min   GFR calc Af Amer 9 (*) >90 mL/min   Comment: (NOTE)     The eGFR has been calculated using the CKD EPI equation.     This calculation has not been validated in all clinical situations.     eGFR's persistently <90 mL/min signify possible Chronic Kidney     Disease.   Anion gap 13  5 - 15  CBC     Status: Abnormal   Collection Time    07/09/14  4:31 AM      Result Value Ref Range   WBC 2.7  (*) 4.0 - 10.5 K/uL   RBC 2.09 (*) 4.22 - 5.81 MIL/uL   Hemoglobin 6.6 (*) 13.0 - 17.0 g/dL   Comment: REPEATED TO VERIFY     CRITICAL RESULT CALLED TO, READ BACK BY AND VERIFIED WITH:     REINERT,L RN AT 0532 09.02.15 BY TIBBITTS,K   HCT 18.9 (*) 39.0 - 52.0 %   MCV 90.4  78.0 - 100.0 fL   MCH 31.6  26.0 - 34.0 pg   MCHC 34.9  30.0 - 36.0 g/dL   RDW 13.5  11.5 - 15.5 %   Platelets 14 (*) 150 - 400 K/uL   Comment: SPECIMEN CHECKED FOR CLOTS     REPEATED TO VERIFY     CRITICAL VALUE NOTED.  VALUE IS CONSISTENT WITH PREVIOUSLY REPORTED AND CALLED VALUE.     PLATELET COUNT CONFIRMED BY SMEAR     DELTA CHECK NOTED  RETICULOCYTES     Status: Abnormal   Collection Time    07/09/14  4:31 AM      Result Value Ref Range   Retic Ct Pct 0.4  0.4 - 3.1 %   RBC. 2.09 (*) 4.22 - 5.81 MIL/uL   Retic Count, Manual 8.4 (*) 19.0 - 186.0 K/uL  LACTATE DEHYDROGENASE     Status: Abnormal   Collection Time    07/09/14  4:31 AM      Result Value Ref  Range   LDH 378 (*) 94 - 250 U/L  PREPARE RBC (CROSSMATCH)     Status: None   Collection Time    07/09/14  6:30 AM      Result Value Ref Range   Order Confirmation ORDER PROCESSED BY BLOOD BANK    GLUCOSE, CAPILLARY     Status: Abnormal   Collection Time    07/09/14  7:29 AM      Result Value Ref Range   Glucose-Capillary 109 (*) 70 - 99 mg/dL    No results found.  ROS Blood pressure 112/59, pulse 94, temperature 97.7 F (36.5 C), temperature source Oral, resp. rate 18, height _0  (1.727 m), weight 114.306 kg (252 lb), SpO2 100.00%. Physical Exam Physical Examination: General appearance - alert, pale, obese, bruising diffusely Mental status - alert, oriented to person, place, and time Eyes - DM retinopathy Mouth - pale, sore on R cheek, bleb Neck - adenopathy noted PCL Lymphatics - posterior cervical nodes Chest - decreased air entry noted bilat, scattered rhonchi Heart - S1 and S2 normal, systolic murmur GN5/6 at 2nd left intercostal  space Abdomen - obese, large umb hernia, pos bx Musculoskeletal - scar with defect in lower back Extremities - pedal edema 3+ + Skin - pale, many bruises and erosions.   Assessment/Plan: 1 CKD 5 with mild worsening.  Acidemic ^ K, and early uremia.  Need to correct e'lytes and acid/base.  Chronic dz is DM, acute is related to pancytopenia, ? Cast Nephropathy with MM.  Cannot R/O hemodynamic worsening with D in setting of ACEI.  Here K^ with transfusion, and toradol ie iatrogenic.  Need to control K.  Also worrisome sx of hesitancy and need to get U/S and bladder scans.  If not better needs HD soon.  BP low will give gentle IVF.  Total body vol xs but low bp.  2 Pancytopenia  ?? MM vs Drugvs viral. Suspect malig 3 Hypertension: not an issue 4. Anemia part of BM picture and CKD 5. Metabolic Bone Disease: check phos , PTH 6 DM 7 Gout check Uric acid 8 MM P ivf, kayex, U/S , PTH, uric acid, follow K , Bone Marrow  Javed Cotto L 07/09/2014, 11:53 AM

## 2014-07-09 NOTE — Progress Notes (Signed)
I very much appreciate everybody's help in working up William Gaines. It is very puzzled as to what is going on with his blood.  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 His platelet count was normal when he had his bone marrow biopsy 3 months ago. His blood count has gone down quite quickly. He had very little response to a platelet transfusion.  He's had no bleeding.  I would not give him any platelets unless he does have some bleeding.  I would think that steroids probably would be most beneficial for him. However, this will clearly make his diabetes much worse.  He is going for a bone marrow biopsy tomorrow.  I think that his anemia is from his renal insufficiency and the fact that he has very little erythropoietin. His reticulocyte count is pretty much 0. This really indicates that he has no marrow stimulation from erythropoietin. He was getting transfused when I saw him. I think he probably is going to need some form of ESA intervention.  I still would have a very hard time believing that this is all myeloma related. Again he only had 8% plasma cells with his bone marrow biopsy.  His LDH is quite high. His creatine kinase is also quite high. I do not know why these are at that level.  I does wonder what his iron studies show. Again, 3 months ago, his iron studies looked okay.  Again, I think the bone marrow biopsy will definitely give [place] an idea as to what is going on. 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 If so, I would try him on steroids and IVIG.  On his exam, he does have some scattered ecchymoses. His blood pressure is 92/57.  I probably would do an echocardiogram on him.  Again, I really think everybody for try to help him out. He is very nice. His wife is having her set of health issues.  Pete E  1 Cor 15:58

## 2014-07-09 NOTE — Progress Notes (Signed)
Patient had  Frequent  watery stools since this morning due to Kayexalate. Unable to start/obtain urine for 24hr urine collection. Patient stated that she urinates everytime  He has BM.  Bladder scan was done as ordered    - 195cc.

## 2014-07-09 NOTE — Progress Notes (Signed)
PROGRESS NOTE    William Gaines IYM:[id number] DOB: 09/20/1941 DOA: 07/08/2014 PCP: Cristine Polio, MD  HPI/Brief narrative 73 year old male with history of kappa light chain myeloma, recently 04/15/14 with bone marrow biopsy which showed no evidence of active myeloma and marrow was hypercellular, PET scan 04/21/14 showed stable lytic lesions, not an active treatment for myeloma, DM 2, HTN, CKD 4-5, went to have AV fistula placed on day of admission and was found to have abnormal labs-severe pancytopenia and was referred to ED for admission.   Assessment/Plan:  1. Pancytopenia: Hematology was consulted. Peripheral smear not consistent with TTP or DIC. Status post 2 PRBC & 1 platelet transfusion. Hb improved from 6.1>7.6 and platelets from <5 to 11. Mx per Oncology- appreciate their assistance. Awaiting bone marrow biopsy. 2. Acute on Stage 5 CKD; worsened by anemia, lasix, ACEI, NSAID's(received Toradol here) and hemodynamics. Rule out cast nephropathy. Renal ultrasound without hydronephrosis. Nephrology consultation appreciated. Avoid nephrotoxic agents/medications. Gentle IV fluid hydration and follow BMP. 3. Hyperkalemia: Likely secondary to worsening renal insufficiency and blood transfusion. Improved to 5.3 after a dose of Kayexalate. Follow BMP in a.m. Gentle IV fluids. 4. Diarrhea: We'll check stool for C. difficile and GI pathogen panel PCR. No antibiotics at this time. 5. Kappa light chain myeloma: Management per oncology. 6. Hypertension: Soft blood pressures. Hold antihypertensives. 7. Type II DM with renal complications: Fluctuating CBGs and had a hypoglycemic episode on 9/1. Reduce Lantus and DC bedtime sliding scale. Monitor closely. 8. History of gout: No acute flare. 9. Metabolic bone disease: Management per nephrology. 10. Bilateral renal masses: Bilateral round hypoechoic masses seen on renal ultrasound-suspected complicated cysts-consider MRI-will discuss with  nephrology.   Code Status: Full Family Communication: None at bedside Disposition Plan: Home in medically stable   Consultants:  Oncology  Nephrology  Procedures:  None  Antibiotics:  None   Subjective: Denied complaints this morning. Denied weakness, dizziness or lightheadedness. Diarrhea-he attributed to Kayexalate.  Objective: Filed Vitals:   07/09/14 0905 07/09/14 1005 07/09/14 1058 07/09/14 1416  BP: 107/65 100/51 112/59 92/57  Pulse: 96 92 94 83  Temp: 97.5 F (36.4 C) 97.7 F (36.5 C)  97.5 F (36.4 C)  TempSrc: Oral Oral  Oral  Resp: $Remo'18 18  16  'tCsIW$ Height:      Weight:      SpO2: 98% 100%  100%    Intake/Output Summary (Last 24 hours) at 07/09/14 1707 Last data filed at 07/09/14 1417  Gross per 24 hour  Intake 1197.17 ml  Output      0 ml  Net 1197.17 ml   Filed Weights   07/08/14 1735  Weight: 114.306 kg (252 lb)     Exam:  General exam: Pleasant elderly male sitting comfortably on chair. Respiratory system: Clear. No increased work of breathing. Cardiovascular system: S1 & S2 heard, RRR. No JVD, murmurs, gallops, clicks. Trace bilateral leg edema. Telemetry: Sinus rhythm. Gastrointestinal system: Abdomen is nondistended, soft and nontender. Normal bowel sounds heard. Central nervous system: Alert and oriented. No focal neurological deficits. Extremities: Symmetric 5 x 5 power.   Data Reviewed: Basic Metabolic Panel:  Recent Labs Lab 07/08/14 1315 07/09/14 0431 07/09/14 1153  NA 142 138 141  K 4.7 6.6* 5.3  CL 108 106 107  CO2 $Re'20 19 19  'rvf$ GLUCOSE 51* 135* 102*  BUN 78* 81* 83*  CREATININE 6.17* 6.43* 6.87*  CALCIUM 8.0* 7.7* 7.9*   Liver Function Tests:  Recent Labs Lab 07/08/14 1315  AST 20  ALT 16  ALKPHOS 63  BILITOT 0.2*  PROT 6.5  ALBUMIN 2.5*   No results found for this basename: LIPASE, AMYLASE,  in the last 168 hours No results found for this basename: AMMONIA,  in the last 168 hours CBC:  Recent Labs Lab  07/08/14 1315 07/08/14 1840 07/09/14 0431 07/09/14 1153  WBC 2.4*  --  2.7* 3.6*  NEUTROABS 1.8  --   --   --   HGB 6.1*  --  6.6* 7.6*  HCT 17.4*  --  18.9* 21.9*  MCV 89.2  --  90.4 89.0  PLT <5* 6* 14* 11*   Cardiac Enzymes:  Recent Labs Lab 07/09/14 1155  CKTOTAL 1727*   BNP (last 3 results) No results found for this basename: PROBNP,  in the last 8760 hours CBG:  Recent Labs Lab 07/08/14 1656 07/08/14 1743 07/08/14 2132 07/09/14 0729 07/09/14 1152  GLUCAP 38* 159* 222* 109* 98    Recent Results (from the past 240 hour(s))  CLOSTRIDIUM DIFFICILE BY PCR     Status: None   Collection Time    07/09/14 11:06 AM      Result Value Ref Range Status   C difficile by pcr NEGATIVE  NEGATIVE Final   Comment: Performed at [facility]       Studies: US Renal  07/09/2014   CLINICAL DATA:  Acute renal failure/chronic kidney disease.  EXAM: RENAL/URINARY TRACT ULTRASOUND COMPLETE  COMPARISON:  CT 12/30/2011  FINDINGS: Right Kidney:  Length: 11.6 cm. Mild increased cortical echogenicity. No hydronephrosis. Round hypoechoic mass over the mid to upper pole cortex measuring 1.8 cm with mild central increased echogenicity which may be a solid mass versus complicated cyst. Second round hypoechoic mass with increased through transmission over the upper pole measuring 1.6 cm likely a minimally complicated cyst.  Left Kidney:  Length: 9.6 cm. Mild increased cortical echogenicity. No hydronephrosis. Rounded hypoechoic 1.7 cm mass over the lower pole with increased through transmission likely a minimally complicated cyst.  Bladder:  Appears normal for degree of bladder distention.  IMPRESSION: Normal size kidneys with increased cortical echogenicity compatible with medical renal disease. No hydronephrosis.  Bilateral round hypoechoic masses likely complicated cysts. Recommend MRI if renal function will allow versus followup ultrasound 6 months.   Electronically Signed   By: Marin Olp M.D.   On: 07/09/2014 14:25        Scheduled Meds: . sodium chloride   Intravenous Once  . allopurinol  150 mg Oral Daily  . insulin aspart  0-15 Units Subcutaneous TID WC  . insulin aspart  0-5 Units Subcutaneous QHS  . insulin glargine  50 Units Subcutaneous QHS  . metoprolol  50 mg Oral BID  . pantoprazole  40 mg Oral Daily  . sodium chloride  3 mL Intravenous Q12H   Continuous Infusions: .  sodium bicarbonate 150 mEq in sterile water 1000 mL infusion 75 mL/hr at 07/09/14 1458    Principal Problem:   Other pancytopenia Active Problems:   Diabetes mellitus due to underlying condition with diabetic polyneuropathy   Hypertension associated with diabetes   Multiple myeloma, in relapse   Chronic renal insufficiency, stage IV (severe)   Pancytopenia   AKI (acute kidney injury)    Time spent: 26 minutes    Okla Qazi, MD, FACP, FHM. [facility] Pager [phone]  If 7PM-7AM, please contact night-coverage [email or web address] Password [email or web address] 07/09/2014, 5:07 PM    LOS: 1 day

## 2014-07-10 ENCOUNTER — Ambulatory Visit: Payer: Medicare Other | Admitting: Hematology & Oncology

## 2014-07-10 ENCOUNTER — Other Ambulatory Visit: Payer: Medicare Other | Admitting: Lab

## 2014-07-10 ENCOUNTER — Inpatient Hospital Stay (HOSPITAL_COMMUNITY): Payer: Medicare Other

## 2014-07-10 DIAGNOSIS — E119 Type 2 diabetes mellitus without complications: Secondary | ICD-10-CM

## 2014-07-10 LAB — CBC WITH DIFFERENTIAL/PLATELET
BASOS PCT: 1 % (ref 0–1)
Basophils Absolute: 0 10*3/uL (ref 0.0–0.1)
EOS PCT: 1 % (ref 0–5)
Eosinophils Absolute: 0 10*3/uL (ref 0.0–0.7)
HEMATOCRIT: 20.8 % — AB (ref 39.0–52.0)
HEMOGLOBIN: 7.3 g/dL — AB (ref 13.0–17.0)
Lymphocytes Relative: 16 % (ref 12–46)
Lymphs Abs: 0.5 10*3/uL — ABNORMAL LOW (ref 0.7–4.0)
MCH: 30.8 pg (ref 26.0–34.0)
MCHC: 35.1 g/dL (ref 30.0–36.0)
MCV: 87.8 fL (ref 78.0–100.0)
Monocytes Absolute: 0.6 10*3/uL (ref 0.1–1.0)
Monocytes Relative: 20 % — ABNORMAL HIGH (ref 3–12)
NEUTROS ABS: 2.1 10*3/uL (ref 1.7–7.7)
NEUTROS PCT: 62 % (ref 43–77)
Platelets: 8 10*3/uL — CL (ref 150–400)
RBC: 2.37 MIL/uL — AB (ref 4.22–5.81)
RDW: 14.1 % (ref 11.5–15.5)
WBC: 3.2 10*3/uL — ABNORMAL LOW (ref 4.0–10.5)

## 2014-07-10 LAB — COMPREHENSIVE METABOLIC PANEL
ALT: 33 U/L (ref 0–53)
ANION GAP: 16 — AB (ref 5–15)
AST: 70 U/L — AB (ref 0–37)
Albumin: 2.3 g/dL — ABNORMAL LOW (ref 3.5–5.2)
Alkaline Phosphatase: 59 U/L (ref 39–117)
BILIRUBIN TOTAL: 0.4 mg/dL (ref 0.3–1.2)
BUN: 89 mg/dL — ABNORMAL HIGH (ref 6–23)
CO2: 20 mEq/L (ref 19–32)
CREATININE: 6.91 mg/dL — AB (ref 0.50–1.35)
Calcium: 7.3 mg/dL — ABNORMAL LOW (ref 8.4–10.5)
Chloride: 101 mEq/L (ref 96–112)
GFR calc Af Amer: 8 mL/min — ABNORMAL LOW (ref >90)
GFR calc non Af Amer: 7 mL/min — ABNORMAL LOW (ref >90)
Glucose, Bld: 97 mg/dL (ref 70–99)
Potassium: 4.9 mEq/L (ref 3.7–5.3)
Sodium: 137 mEq/L (ref 137–147)
TOTAL PROTEIN: 6.1 g/dL (ref 6.0–8.3)

## 2014-07-10 LAB — PROTEIN ELECTROPHORESIS, SERUM
ALBUMIN ELP: 43.5 % — AB (ref 55.8–66.1)
ALPHA-1-GLOBULIN: 6.2 % — AB (ref 2.9–4.9)
Alpha-2-Globulin: 11.8 % (ref 7.1–11.8)
BETA 2: 9.3 % — AB (ref 3.2–6.5)
Beta Globulin: 6.7 % (ref 4.7–7.2)
Gamma Globulin: 22.5 % — ABNORMAL HIGH (ref 11.1–18.8)
M-Spike, %: NOT DETECTED g/dL
TOTAL PROTEIN ELP: 5.9 g/dL — AB (ref 6.0–8.3)

## 2014-07-10 LAB — URINE MICROSCOPIC-ADD ON

## 2014-07-10 LAB — GLUCOSE, CAPILLARY
GLUCOSE-CAPILLARY: 35 mg/dL — AB (ref 70–99)
GLUCOSE-CAPILLARY: 229 mg/dL — AB (ref 70–99)
Glucose-Capillary: 107 mg/dL — ABNORMAL HIGH (ref 70–99)
Glucose-Capillary: 174 mg/dL — ABNORMAL HIGH (ref 70–99)

## 2014-07-10 LAB — URINALYSIS, ROUTINE W REFLEX MICROSCOPIC
Bilirubin Urine: NEGATIVE
GLUCOSE, UA: 100 mg/dL — AB
Ketones, ur: NEGATIVE mg/dL
LEUKOCYTES UA: NEGATIVE
Nitrite: NEGATIVE
SPECIFIC GRAVITY, URINE: 1.02 (ref 1.005–1.030)
Urobilinogen, UA: 0.2 mg/dL (ref 0.0–1.0)
pH: 5 (ref 5.0–8.0)

## 2014-07-10 LAB — IRON AND TIBC
IRON: 58 ug/dL (ref 42–135)
Saturation Ratios: 28 % (ref 20–55)
TIBC: 208 ug/dL — AB (ref 215–435)
UIBC: 150 ug/dL (ref 125–400)

## 2014-07-10 LAB — SODIUM, URINE, RANDOM: SODIUM UR: 29 meq/L

## 2014-07-10 LAB — IMMUNOFIXATION ELECTROPHORESIS
IGA: 479 mg/dL — AB (ref 68–379)
IGG (IMMUNOGLOBIN G), SERUM: 1390 mg/dL (ref 650–1600)
IGM, SERUM: 26 mg/dL — AB (ref 41–251)
TOTAL PROTEIN ELP: 5.9 g/dL — AB (ref 6.0–8.3)

## 2014-07-10 LAB — CREATININE, URINE, RANDOM: CREATININE, URINE: 205.3 mg/dL

## 2014-07-10 LAB — IGG, IGA, IGM
IGA: 434 mg/dL — AB (ref 68–379)
IGG (IMMUNOGLOBIN G), SERUM: 1250 mg/dL (ref 650–1600)
IgM, Serum: 29 mg/dL — ABNORMAL LOW (ref 41–251)

## 2014-07-10 LAB — PARATHYROID HORMONE, INTACT (NO CA): PTH: 319 pg/mL — AB (ref 14–64)

## 2014-07-10 LAB — HEPATITIS C ANTIBODY (REFLEX): HCV Ab: NEGATIVE

## 2014-07-10 LAB — HEPATITIS B SURFACE ANTIGEN: Hepatitis B Surface Ag: NEGATIVE

## 2014-07-10 LAB — PHOSPHORUS: PHOSPHORUS: 6.4 mg/dL — AB (ref 2.3–4.6)

## 2014-07-10 LAB — BONE MARROW EXAM

## 2014-07-10 MED ORDER — METOPROLOL TARTRATE 25 MG PO TABS
25.0000 mg | ORAL_TABLET | Freq: Two times a day (BID) | ORAL | Status: DC
  Administered 2014-07-11 – 2014-07-15 (×9): 25 mg via ORAL
  Filled 2014-07-10 (×11): qty 1

## 2014-07-10 MED ORDER — SODIUM CHLORIDE 0.9 % IV SOLN: Freq: Once | INTRAVENOUS | Status: DC

## 2014-07-10 MED ORDER — DARBEPOETIN ALFA-POLYSORBATE 200 MCG/0.4ML IJ SOLN
200.0000 ug | Freq: Once | INTRAMUSCULAR | Status: AC
  Administered 2014-07-10: 200 ug via SUBCUTANEOUS
  Filled 2014-07-10: qty 0.4

## 2014-07-10 NOTE — Progress Notes (Signed)
Subjective: Interval History: has complaints food is not so good.  .  Objective: Vital signs in last 24 hours: Temp:  [97.5 F (36.4 C)-98.4 F (36.9 C)] 98.2 F (36.8 C) (09/03 0444) Pulse Rate:  [83-95] 84 (09/03 0444) Resp:  [16-20] 18 (09/03 0444) BP: (92-118)/(55-62) 93/55 mmHg (09/03 0444) SpO2:  [96 %-100 %] 96 % (09/03 0444) Weight change:   Intake/Output from previous day: 09/02 0701 - 09/03 0700 In: 2300.5 [P.O.:840; I.V.:1130.5; Blood:330] Out: 500 [Urine:500] Intake/Output this shift: Total I/O In: -  Out: 160 [Urine:160]  General appearance: alert, cooperative and moderately obese Resp: rhonchi bibasilar Cardio: S1, S2 normal and systolic murmur: holosystolic 2/6, blowing at apex GI: obese,pos bs, liver down 6 cm, hernia umb Extremities: edema 3+  Lab Results:  Recent Labs  07/09/14 1153 07/10/14 0425  WBC 3.6* 3.2*  HGB 7.6* 7.3*  HCT 21.9* 20.8*  PLT 11* 8*   BMET:  Recent Labs  07/09/14 1153 07/10/14 0425  NA 141 137  K 5.3 4.9  CL 107 101  CO2 19 20  GLUCOSE 102* 97  BUN 83* 89*  CREATININE 6.87* 6.91*  CALCIUM 7.9* 7.3*   No results found for this basename: PTH,  in the last 72 hours Iron Studies:  Recent Labs  07/08/14 1840  07/10/14 0425  IRON  --   < > 58  TIBC  --   < > 208*  FERRITIN 360*  --   --   < > = values in this interval not displayed.  Studies/Results: US Renal  07/09/2014   CLINICAL DATA:  Acute renal failure/chronic kidney disease.  EXAM: RENAL/URINARY TRACT ULTRASOUND COMPLETE  COMPARISON:  CT 12/30/2011  FINDINGS: Right Kidney:  Length: 11.6 cm. Mild increased cortical echogenicity. No hydronephrosis. Round hypoechoic mass over the mid to upper pole cortex measuring 1.8 cm with mild central increased echogenicity which may be a solid mass versus complicated cyst. Second round hypoechoic mass with increased through transmission over the upper pole measuring 1.6 cm likely a minimally complicated cyst.  Left Kidney:   Length: 9.6 cm. Mild increased cortical echogenicity. No hydronephrosis. Rounded hypoechoic 1.7 cm mass over the lower pole with increased through transmission likely a minimally complicated cyst.  Bladder:  Appears normal for degree of bladder distention.  IMPRESSION: Normal size kidneys with increased cortical echogenicity compatible with medical renal disease. No hydronephrosis.  Bilateral round hypoechoic masses likely complicated cysts. Recommend MRI if renal function will allow versus followup ultrasound 6 months.   Electronically Signed   By: Marin Olp M.D.   On: 07/09/2014 14:25    I have reviewed the patient's current medications.  Assessment/Plan: 1 CKD5 Cr plateau but at very low level so not conclusive meaning.  Acid base and K better. Will cont ivf. No obstruction on U/S. Has hematuria, will culture. 2 Anemia BM and renal will give epo 3 Obesity 4 Pancytopenia BM done.  ? ITP but doesn't will explain picture.    5 MM 6 Gout 7 DM controlled follow on steroids P follow Cr, bicarb,. K, cont ivf    LOS: 2 days   Jaidan Prevette L 07/10/2014,11:28 AM

## 2014-07-10 NOTE — Procedures (Signed)
Technically successful CT guided biopsy of left iliac crest  No immediate post procedural complications.

## 2014-07-10 NOTE — Progress Notes (Signed)
Called Dr. Loreli Slot office to clarify platelet transfusion ordered earlier. Spoke with Jana Hakim and  said per MD   patient does not need platelet transfusion unless bleeding.

## 2014-07-10 NOTE — Progress Notes (Signed)
Biopsy site recheked, noted dressing  soaked with moderate amount of blood, dressing changed. Will continue to monitor accordingly.

## 2014-07-10 NOTE — Progress Notes (Signed)
Mr. Want is about the same. He's had no bleeding. He got 2 units of blood yesterday.  Platelet count is back to 8000. He really did not have much of a response to the transfusion. This is no surprise. Again, I have to suspect that he has an immune based thrombocytopenia.  I would not transfuse him unless he is actively bleeding.  He did have his bone marrow test today. I should have some preliminary result tomorrow.  Again, I would hate to give him steroids unless I really had to. Because of his diabetes and, after that his blood sugars will probably go high.  I did do some iron studies on him. His iron saturation is 28%.  His hemoglobin is 7.3. White cell count is 3.2.  He's had no fever. He's had no cough. He's really not getting out of bed. He is sitting in a chair. I think that physical therapy would be very helpful for him.  His blood pressure is still on the low side. I wonder if some of his medications can be readjusted. He is afebrile. His heart rate is 84. His lungs sound pretty clear. He has some slight decrease at the bases. He's had no abdominal pain. He's had no palpable liver or spleen tip. Cardiac exam regular rate and rhythm. His abdomen is soft. He is somewhat obese. There is no obvious liver or spleen tip. Extremities shows 2+ edema.  We will have to see what his bone marrow shows. Again, I have to believe that this is going to be immune based thrombocytopenia.  I appreciate everybody's help in taking care of him  Pete E   Psalm 16:9.

## 2014-07-10 NOTE — Progress Notes (Signed)
Bleeding stopped after pressure dressing applied.

## 2014-07-10 NOTE — Progress Notes (Signed)
PROGRESS NOTE    William Gaines BHA:[id number] DOB: 1940/11/12 DOA: 07/08/2014 PCP: Cristine Polio, MD  HPI/Brief narrative 73 year old male with history of kappa light chain myeloma, recently 04/15/14 with bone marrow biopsy which showed no evidence of active myeloma and marrow was hypercellular, PET scan 04/21/14 showed stable lytic lesions, not an active treatment for myeloma, DM 2, HTN, CKD 4-5, went to have AV fistula placed on day of admission and was found to have abnormal labs-severe pancytopenia and was referred to ED for admission.   Assessment/Plan:  1. Pancytopenia: Hematology input appreciated. Peripheral smear not consistent with TTP or DIC. Status post 2 PRBC & 1 platelet transfusion. Hb improved from 6.1>7.6 >7.3 and platelets from <5 >11 > 8. Mx per Oncology- appreciate their assistance. Bone marrow biopsy performed 9/3-await results. Hematology suspecting immune mediated thrombocytopenia but not sure if it will explain his leukopenia. Transfuse PRBC if hemoglobin less than 7 g per DL. Transfuse platelets only if bleeding. 2. Acute on Stage 5 CKD; worsened by anemia, lasix, ACEI, NSAID's(received Toradol here) and hemodynamics. Rule out cast nephropathy. Renal ultrasound without hydronephrosis. Nephrology consultation appreciated. Avoid nephrotoxic agents/medications. Creatinine gradually increasing. Management per nephrology. 3. Hyperkalemia: Likely secondary to worsening renal insufficiency and blood transfusion. Improved to 5.3 after a dose of Kayexalate. Gentle IV fluids. Resolved. Monitor closely. 4. Diarrhea: Seems to have resolved. C. difficile PCR negative. 5. Kappa light chain myeloma: Management per oncology. 6. Hypertension: Soft blood pressures. We'll reduce metoprolol. 7. Type II DM with renal complications: Fluctuating CBGs and had a hypoglycemic episode on 9/1. Reduce Lantus and DC bedtime sliding scale. Monitor closely. No further hypoglycemic episodes and  reasonable control. 8. History of gout: No acute flare. 9. Metabolic bone disease: Management per nephrology. 10. Bilateral renal masses: Bilateral round hypoechoic masses seen on renal ultrasound-suspected complicated cysts-consider MRI-will discuss with nephrology.   Code Status: Full Family Communication: None at bedside Disposition Plan: Home when medically stable   Consultants:  Oncology  Nephrology  Procedures:  Bone marrow biopsy 9/3  Antibiotics:  None   Subjective: Patient denied complaints. Denied dyspnea. Good urine output. Had just returned from bone marrow biopsy. As per nursing, does not want to mobilize.  Objective: Filed Vitals:   07/09/14 2128 07/10/14 0444 07/10/14 1324 07/10/14 1413  BP: 118/62 93/55 103/76 105/66  Pulse: 95 84 89 86  Temp: 98.4 F (36.9 C) 98.2 F (36.8 C) 98.1 F (36.7 C)   TempSrc: Oral Oral Oral   Resp: $Remo'20 18 16   'hMKLY$ Height:      Weight:      SpO2: 96% 96% 97%     Intake/Output Summary (Last 24 hours) at 07/10/14 1730 Last data filed at 07/10/14 1623  Gross per 24 hour  Intake 2087.5 ml  Output   1010 ml  Net 1077.5 ml   Filed Weights   07/08/14 1735  Weight: 114.306 kg (252 lb)     Exam:  General exam: Pleasant elderly male sitting up in bed seen eating breakfast. Respiratory system: Clear. No increased work of breathing. Cardiovascular system: S1 & S2 heard, RRR. No JVD, murmurs, gallops, clicks. Trace bilateral leg edema. Telemetry: Sinus rhythm-DC'd 9/3. Gastrointestinal system: Abdomen is nondistended, soft and nontender. Normal bowel sounds heard. Central nervous system: Alert and oriented. No focal neurological deficits. Extremities: Symmetric 5 x 5 power.   Data Reviewed: Basic Metabolic Panel:  Recent Labs Lab 07/08/14 1315 07/09/14 0431 07/09/14 1153 07/10/14 0425  NA 142 138 141 137  K 4.7 6.6* 5.3 4.9  CL 108 106 107 101  CO2 $Re'20 19 19 20  'QZf$ GLUCOSE 51* 135* 102* 97  BUN 78* 81* 83* 89*    CREATININE 6.17* 6.43* 6.87* 6.91*  CALCIUM 8.0* 7.7* 7.9* 7.3*  PHOS  --   --   --  6.4*   Liver Function Tests:  Recent Labs Lab 07/08/14 1315 07/10/14 0425  AST 20 70*  ALT 16 33  ALKPHOS 63 59  BILITOT 0.2* 0.4  PROT 6.5 6.1  ALBUMIN 2.5* 2.3*   No results found for this basename: LIPASE, AMYLASE,  in the last 168 hours No results found for this basename: AMMONIA,  in the last 168 hours CBC:  Recent Labs Lab 07/08/14 1315 07/08/14 1840 07/09/14 0431 07/09/14 1153 07/10/14 0425  WBC 2.4*  --  2.7* 3.6* 3.2*  NEUTROABS 1.8  --   --   --  2.1  HGB 6.1*  --  6.6* 7.6* 7.3*  HCT 17.4*  --  18.9* 21.9* 20.8*  MCV 89.2  --  90.4 89.0 87.8  PLT <5* 6* 14* 11* 8*   Cardiac Enzymes:  Recent Labs Lab 07/09/14 1155  CKTOTAL 1727*   BNP (last 3 results) No results found for this basename: PROBNP,  in the last 8760 hours CBG:  Recent Labs Lab 07/09/14 1152 07/09/14 1732 07/09/14 2126 07/10/14 1147 07/10/14 1621  GLUCAP 98 93 198* 107* 174*    Recent Results (from the past 240 hour(s))  CLOSTRIDIUM DIFFICILE BY PCR     Status: None   Collection Time    07/09/14 11:06 AM      Result Value Ref Range Status   C difficile by pcr NEGATIVE  NEGATIVE Final   Comment: Performed at [facility]       Studies: US Renal  07/09/2014   CLINICAL DATA:  Acute renal failure/chronic kidney disease.  EXAM: RENAL/URINARY TRACT ULTRASOUND COMPLETE  COMPARISON:  CT 12/30/2011  FINDINGS: Right Kidney:  Length: 11.6 cm. Mild increased cortical echogenicity. No hydronephrosis. Round hypoechoic mass over the mid to upper pole cortex measuring 1.8 cm with mild central increased echogenicity which may be a solid mass versus complicated cyst. Second round hypoechoic mass with increased through transmission over the upper pole measuring 1.6 cm likely a minimally complicated cyst.  Left Kidney:  Length: 9.6 cm. Mild increased cortical echogenicity. No hydronephrosis. Rounded  hypoechoic 1.7 cm mass over the lower pole with increased through transmission likely a minimally complicated cyst.  Bladder:  Appears normal for degree of bladder distention.  IMPRESSION: Normal size kidneys with increased cortical echogenicity compatible with medical renal disease. No hydronephrosis.  Bilateral round hypoechoic masses likely complicated cysts. Recommend MRI if renal function will allow versus followup ultrasound 6 months.   Electronically Signed   By: Marin Olp M.D.   On: 07/09/2014 14:25   Ct Biopsy  07/10/2014   INDICATION: History of multiple myeloma. Please perform CT guided bone marrow biopsy and aspiration.  EXAM: CT-GUIDED BONE MARROW ASPIRATION AND BIOPSY.  MEDICATIONS: None  ANESTHESIA/SEDATION: None  CONTRAST:  None  COMPLICATIONS: None immediate.  PROCEDURE: Informed consent was obtained from the patient following an explanation of the procedure, risks, benefits and alternatives. The patient understands, agrees and consents for the procedure. All questions were addressed. A time out was performed prior to the initiation of the procedure. The patient was positioned prone and non-contrast localization CT was performed of the pelvis to demonstrate the iliac marrow spaces.  The operative site was prepped and draped in the usual sterile fashion.  Under sterile conditions and local anesthesia, a 22 gauge spinal needle was utilized for procedural planning. Next, an 11 gauge coaxial bone biopsy needle was advanced into the left iliac marrow space. Needle position was confirmed with CT imaging. Initially, bone marrow aspiration was performed. Next, a bone marrow biopsy was obtained with the 11 gauge outer bone marrow device. Samples were prepared with the cytotechnologist and deemed adequate. The needle was removed intact. Hemostasis was obtained with compression and a dressing was placed. The patient tolerated the procedure well without immediate post procedural complication.  IMPRESSION:  Successful CT guided left iliac bone marrow aspiration and core biopsies.   Electronically Signed   By: Sandi Mariscal M.D.   On: 07/10/2014 13:04        Scheduled Meds: . sodium chloride   Intravenous Once  . sodium chloride   Intravenous Once  . allopurinol  150 mg Oral Daily  . insulin aspart  0-9 Units Subcutaneous TID WC  . insulin glargine  40 Units Subcutaneous QHS  . metoprolol  50 mg Oral BID  . pantoprazole  40 mg Oral Daily  . sodium chloride  3 mL Intravenous Q12H   Continuous Infusions: .  sodium bicarbonate 150 mEq in sterile water 1000 mL infusion 75 mL/hr at 07/10/14 6004    Principal Problem:   Other pancytopenia Active Problems:   Diabetes mellitus due to underlying condition with diabetic polyneuropathy   Hypertension associated with diabetes   Multiple myeloma, in relapse   Diabetes mellitus type 2 with complications   Chronic renal insufficiency, stage IV (severe)   Acute on chronic renal failure   Hyperkalemia   Diarrhea    Time spent: 51 minutes    Deztinee Lohmeyer, MD, FACP, FHM. [facility] Pager [phone]  If 7PM-7AM, please contact night-coverage [email or web address] Password [email or web address] 07/10/2014, 5:30 PM    LOS: 2 days

## 2014-07-10 NOTE — Progress Notes (Signed)
Received post biopsy, alert and oriented, not on distress, biospy site checked, dressing dry and intact.Patient no complain of any pain or discomfort at this time.

## 2014-07-10 NOTE — Progress Notes (Signed)
PT Cancellation Note  Patient Details Name: William Gaines MRN: [id number] DOB: 11/21/1940   Cancelled Treatment:    Reason Eval/Treat Not Completed: Medical issues which prohibited therapy (to have platelet transfusion per orders and tenative BM biopsy today)   Honestii Marton,KATHrine E 07/10/2014, 8:34 AM Carmelia Bake, PT, DPT 07/10/2014 Pager: [phone]

## 2014-07-10 NOTE — Progress Notes (Signed)
Echocardiogram 2D Echocardiogram has been performed.  Omie Ferger 07/10/2014, 10:57 AM

## 2014-07-11 LAB — GLUCOSE, CAPILLARY
GLUCOSE-CAPILLARY: 75 mg/dL (ref 70–99)
GLUCOSE-CAPILLARY: 118 mg/dL — AB (ref 70–99)
GLUCOSE-CAPILLARY: 179 mg/dL — AB (ref 70–99)
Glucose-Capillary: 323 mg/dL — ABNORMAL HIGH (ref 70–99)

## 2014-07-11 LAB — RENAL FUNCTION PANEL
Albumin: 2.2 g/dL — ABNORMAL LOW (ref 3.5–5.2)
Anion gap: 15 (ref 5–15)
BUN: 88 mg/dL — ABNORMAL HIGH (ref 6–23)
CALCIUM: 7 mg/dL — AB (ref 8.4–10.5)
CHLORIDE: 100 meq/L (ref 96–112)
CO2: 25 meq/L (ref 19–32)
CREATININE: 6.78 mg/dL — AB (ref 0.50–1.35)
GFR calc Af Amer: 8 mL/min — ABNORMAL LOW (ref >90)
GFR calc non Af Amer: 7 mL/min — ABNORMAL LOW (ref >90)
Glucose, Bld: 96 mg/dL (ref 70–99)
Phosphorus: 6.1 mg/dL — ABNORMAL HIGH (ref 2.3–4.6)
Potassium: 3.9 mEq/L (ref 3.7–5.3)
Sodium: 140 mEq/L (ref 137–147)

## 2014-07-11 LAB — CBC
HCT: 19 % — ABNORMAL LOW (ref 39.0–52.0)
HEMOGLOBIN: 6.8 g/dL — AB (ref 13.0–17.0)
MCH: 31.8 pg (ref 26.0–34.0)
MCHC: 35.8 g/dL (ref 30.0–36.0)
MCV: 88.8 fL (ref 78.0–100.0)
Platelets: 6 10*3/uL — CL (ref 150–400)
RBC: 2.14 MIL/uL — AB (ref 4.22–5.81)
RDW: 13.6 % (ref 11.5–15.5)
WBC: 2.6 10*3/uL — ABNORMAL LOW (ref 4.0–10.5)

## 2014-07-11 LAB — PREPARE PLATELET PHERESIS: Unit division: 0

## 2014-07-11 LAB — HEMOGLOBIN AND HEMATOCRIT, BLOOD
HEMATOCRIT: 25.9 % — AB (ref 39.0–52.0)
HEMOGLOBIN: 9 g/dL — AB (ref 13.0–17.0)

## 2014-07-11 LAB — PREPARE RBC (CROSSMATCH)

## 2014-07-11 LAB — ERYTHROPOIETIN: ERYTHROPOIETIN: 12.3 m[IU]/mL (ref 2.6–18.5)

## 2014-07-11 MED ORDER — FUROSEMIDE 10 MG/ML IJ SOLN
40.0000 mg | Freq: Once | INTRAMUSCULAR | Status: AC
  Administered 2014-07-11: 40 mg via INTRAVENOUS
  Filled 2014-07-11: qty 4

## 2014-07-11 MED ORDER — IMMUNE GLOBULIN (HUMAN) 10 GM/100ML IV SOLN
400.0000 mg/kg | INTRAVENOUS | Status: DC
  Filled 2014-07-11: qty 450

## 2014-07-11 MED ORDER — SODIUM CHLORIDE 0.9 % IV SOLN
Freq: Once | INTRAVENOUS | Status: AC
  Administered 2014-07-11: 06:00:00 via INTRAVENOUS

## 2014-07-11 MED ORDER — FAMCICLOVIR 500 MG PO TABS
250.0000 mg | ORAL_TABLET | Freq: Every day | ORAL | Status: DC
  Administered 2014-07-11 – 2014-07-15 (×5): 250 mg via ORAL
  Filled 2014-07-11 (×5): qty 0.5

## 2014-07-11 MED ORDER — IMMUNE GLOBULIN (HUMAN) 10 GM/100ML IV SOLN
400.0000 mg/kg | INTRAVENOUS | Status: DC
  Administered 2014-07-11 – 2014-07-14 (×4): 45 g via INTRAVENOUS
  Filled 2014-07-11 (×4): qty 450

## 2014-07-11 MED ORDER — DEXAMETHASONE SODIUM PHOSPHATE 10 MG/ML IJ SOLN
40.0000 mg | INTRAMUSCULAR | Status: AC
  Administered 2014-07-11 – 2014-07-14 (×4): 40 mg via INTRAVENOUS
  Filled 2014-07-11 (×4): qty 4

## 2014-07-11 MED ORDER — DEXAMETHASONE SODIUM PHOSPHATE 4 MG/ML IJ SOLN
40.0000 mg | INTRAMUSCULAR | Status: DC
  Filled 2014-07-11: qty 10

## 2014-07-11 MED ORDER — CALCITRIOL 0.25 MCG PO CAPS
0.2500 ug | ORAL_CAPSULE | Freq: Every day | ORAL | Status: DC
  Administered 2014-07-11 – 2014-07-15 (×5): 0.25 ug via ORAL
  Filled 2014-07-11 (×5): qty 1

## 2014-07-11 MED ORDER — SODIUM CHLORIDE 0.9 % IV SOLN
Freq: Once | INTRAVENOUS | Status: AC
  Administered 2014-07-11: 11:00:00 via INTRAVENOUS

## 2014-07-11 NOTE — Progress Notes (Signed)
1st unit PRBC transfused, no adverse reaction noted, patient no complaints of any discomfort.

## 2014-07-11 NOTE — Progress Notes (Signed)
Subjective: Interval History: has complaints losing blood, not progressing.  Objective: Vital signs in last 24 hours: Temp:  [97.5 F (36.4 C)-98.7 F (37.1 C)] 98.3 F (36.8 C) (09/04 1100) Pulse Rate:  [86-96] 96 (09/04 1100) Resp:  [16-18] 16 (09/04 1100) BP: (102-118)/(50-76) 115/57 mmHg (09/04 1100) SpO2:  [97 %-100 %] 98 % (09/04 1100) Weight change:   Intake/Output from previous day: 09/03 0701 - 09/04 0700 In: 2247.5 [P.O.:360; I.V.:1875; Blood:12.5] Out: 1360 [Urine:1360] Intake/Output this shift: Total I/O In: 582.5 [P.O.:240; Blood:342.5] Out: 150 [Urine:150]  General appearance: alert, cooperative, moderately obese and pale Resp: diminished breath sounds bilaterally, rales bibasilar and rhonchi bibasilar Cardio: S1, S2 normal and systolic murmur: holosystolic 2/6, blowing at apex GI: obese, pos bs, large umb hernia Extremities: edema 3+  Lab Results:  Recent Labs  07/10/14 0425 07/11/14 0410  WBC 3.2* 2.6*  HGB 7.3* 6.8*  HCT 20.8* 19.0*  PLT 8* 6*   BMET:  Recent Labs  07/10/14 0425 07/11/14 0410  NA 137 140  K 4.9 3.9  CL 101 100  CO2 20 25  GLUCOSE 97 96  BUN 89* 88*  CREATININE 6.91* 6.78*  CALCIUM 7.3* 7.0*    Recent Labs  07/09/14 1438  PTH 319*   Iron Studies:  Recent Labs  07/08/14 1840  07/10/14 0425  IRON  --   < > 58  TIBC  --   < > 208*  FERRITIN 360*  --   --   < > = values in this interval not displayed.  Studies/Results: US Renal  07/09/2014   CLINICAL DATA:  Acute renal failure/chronic kidney disease.  EXAM: RENAL/URINARY TRACT ULTRASOUND COMPLETE  COMPARISON:  CT 12/30/2011  FINDINGS: Right Kidney:  Length: 11.6 cm. Mild increased cortical echogenicity. No hydronephrosis. Round hypoechoic mass over the mid to upper pole cortex measuring 1.8 cm with mild central increased echogenicity which may be a solid mass versus complicated cyst. Second round hypoechoic mass with increased through transmission over the upper pole  measuring 1.6 cm likely a minimally complicated cyst.  Left Kidney:  Length: 9.6 cm. Mild increased cortical echogenicity. No hydronephrosis. Rounded hypoechoic 1.7 cm mass over the lower pole with increased through transmission likely a minimally complicated cyst.  Bladder:  Appears normal for degree of bladder distention.  IMPRESSION: Normal size kidneys with increased cortical echogenicity compatible with medical renal disease. No hydronephrosis.  Bilateral round hypoechoic masses likely complicated cysts. Recommend MRI if renal function will allow versus followup ultrasound 6 months.   Electronically Signed   By: Elberta Fortis M.D.   On: 07/09/2014 14:25   Ct Biopsy  07/10/2014   INDICATION: History of multiple myeloma. Please perform CT guided bone marrow biopsy and aspiration.  EXAM: CT-GUIDED BONE MARROW ASPIRATION AND BIOPSY.  MEDICATIONS: None  ANESTHESIA/SEDATION: None  CONTRAST:  None  COMPLICATIONS: None immediate.  PROCEDURE: Informed consent was obtained from the patient following an explanation of the procedure, risks, benefits and alternatives. The patient understands, agrees and consents for the procedure. All questions were addressed. A time out was performed prior to the initiation of the procedure. The patient was positioned prone and non-contrast localization CT was performed of the pelvis to demonstrate the iliac marrow spaces. The operative site was prepped and draped in the usual sterile fashion.  Under sterile conditions and local anesthesia, a 22 gauge spinal needle was utilized for procedural planning. Next, an 11 gauge coaxial bone biopsy needle was advanced into the left iliac marrow space.  Needle position was confirmed with CT imaging. Initially, bone marrow aspiration was performed. Next, a bone marrow biopsy was obtained with the 11 gauge outer bone marrow device. Samples were prepared with the cytotechnologist and deemed adequate. The needle was removed intact. Hemostasis was  obtained with compression and a dressing was placed. The patient tolerated the procedure well without immediate post procedural complication.  IMPRESSION: Successful CT guided left iliac bone marrow aspiration and core biopsies.   Electronically Signed   By: Sandi Mariscal M.D.   On: 07/10/2014 13:04    I have reviewed the patient's current medications.  Assessment/Plan: 1 CKD 5 bicarb improved, K ok and Cr stable, not rising.  Starting to make more urine 2 Pancytopenia ?? Cause malig vs ITP 3 Obesity 4 DM controlled 5 HPTH start Vit D 6 Anemia BM process and CKD  P stop ivf, allow to diurese, follow Renal function, K, bicarb. BM pending    LOS: 3 days   William Gaines L 07/11/2014,11:14 AM

## 2014-07-11 NOTE — Progress Notes (Signed)
2nd unit PRBC started, no adverse reaction noted.

## 2014-07-11 NOTE — Progress Notes (Signed)
Clarified with Dr. Jonette Eva, transfuse only 2 units PRBC today.

## 2014-07-11 NOTE — Progress Notes (Signed)
PROGRESS NOTE    William Gaines STATES YEL:[id number] DOB: [birth year] DOA: 07/08/2014 PCP: Cristine Polio, MD  HPI/Brief narrative 73 year old male with history of kappa light chain myeloma, recently 04/15/14 with bone marrow biopsy which showed no evidence of active myeloma and marrow was hypercellular, PET scan 04/21/14 showed stable lytic lesions, not an active treatment for myeloma, DM 2, HTN, CKD 4-5, went to have AV fistula placed on day of admission and was found to have abnormal labs-severe pancytopenia and was referred to ED for admission.   Assessment/Plan:  1. Pancytopenia: Hematology input appreciated. Peripheral smear not consistent with TTP or DIC. Status post 2 PRBC & 1 platelet transfusion. Hb improved from 6.1>7.6 >7.3 and platelets from <5 >11 > 8. Mx per Oncology- appreciate their assistance. Bone marrow biopsy performed 9/3-await results. Hematology suspecting immune mediated thrombocytopenia but not sure if it will explain his leukopenia. Transfuse PRBC if hemoglobin less than 7 g per DL. Transfuse platelets only if bleeding. Hemoglobin dropped to 6.8 on 9/4-transfusing 2 PRBCs. 2. Acute on Stage 5 CKD; worsened by anemia, lasix, ACEI, NSAID's(received Toradol here) and hemodynamics. Rule out cast nephropathy. Renal ultrasound without hydronephrosis. Nephrology consultation appreciated. Avoid nephrotoxic agents/medications. Creatinine gradually increasing. Management per nephrology. DC'd IV fluids. Good urine output. 3. Hyperkalemia: Likely secondary to worsening renal insufficiency and blood transfusion. Improved to 5.3 after a dose of Kayexalate. Gentle IV fluids. Resolved. Monitor closely. 4. Diarrhea: Seems to have resolved. C. difficile PCR negative. 5. Kappa light chain myeloma: Management per oncology. 6. Hypertension: Soft blood pressures. Reduced metoprolol. Better. 7. Type II DM with renal complications: Fluctuating CBGs and had a hypoglycemic episode on 9/1. Reduce Lantus  and DC bedtime sliding scale. Monitor closely. No further hypoglycemic episodes and fluctuating CBGs. 8. History of gout: No acute flare. 9. Metabolic bone disease: Management per nephrology. 10. Bilateral renal masses: Bilateral round hypoechoic masses seen on renal ultrasound-suspected complicated cysts-consider MRI-will discuss with nephrology.   Code Status: Full Family Communication: Discussed with spouse on 9/4. Disposition Plan: Home when medically stable   Consultants:  Oncology  Nephrology  Procedures:  Bone marrow biopsy 9/3  Antibiotics:  None   Subjective: Patient denied complaints. Denied dyspnea. Making more urine than yesterday.  Objective: Filed Vitals:   07/11/14 1345 07/11/14 1415 07/11/14 1500 07/11/14 1530  BP: 114/69 116/68 127/65 135/67  Pulse: 80 86 81 83  Temp: 98.6 F (37 C) 97.8 F (36.6 C) 97.7 F (36.5 C) 97.6 F (36.4 C)  TempSrc: Oral Oral Oral Oral  Resp: _0 Height:      Weight:      SpO2: 99% 99% 98% 99%    Intake/Output Summary (Last 24 hours) at 07/11/14 1631 Last data filed at 07/11/14 1345  Gross per 24 hour  Intake   2970 ml  Output   1710 ml  Net   1260 ml   Filed Weights   07/08/14 1735  Weight: 114.306 kg (252 lb)     Exam:  General exam: Pleasant elderly male sitting up in bed seen eating breakfast. Respiratory system: Clear. No increased work of breathing. Cardiovascular system: S1 & S2 heard, RRR. No JVD, murmurs, gallops, clicks. Trace bilateral leg edema.  Gastrointestinal system: Abdomen is nondistended, soft and nontender. Normal bowel sounds heard. Central nervous system: Alert and oriented. No focal neurological deficits. Extremities: Symmetric 5 x 5 power.   Data Reviewed: Basic Metabolic Panel:  Recent Labs Lab 07/08/14 1315 07/09/14 0431 07/09/14 1153 07/10/14 0425  07/11/14 0410  NA 142 138 141 137 140  K 4.7 6.6* 5.3 4.9 3.9  CL 108 106 107 101 100  CO2 _0 GLUCOSE 51* 135* 102* 97 96  BUN 78* 81* 83* 89* 88*  CREATININE 6.17* 6.43* 6.87* 6.91* 6.78*  CALCIUM 8.0* 7.7* 7.9* 7.3* 7.0*  PHOS  --   --   --  6.4* 6.1*   Liver Function Tests:  Recent Labs Lab 07/08/14 1315 07/10/14 0425 07/11/14 0410  AST 20 70*  --   ALT 16 33  --   ALKPHOS 63 59  --   BILITOT 0.2* 0.4  --   PROT 6.5 6.1  --   ALBUMIN 2.5* 2.3* 2.2*   No results found for this basename: LIPASE, AMYLASE,  in the last 168 hours No results found for this basename: AMMONIA,  in the last 168 hours CBC:  Recent Labs Lab 07/08/14 1315 07/08/14 1840 07/09/14 0431 07/09/14 1153 07/10/14 0425 07/11/14 0410 07/11/14 1534  WBC 2.4*  --  2.7* 3.6* 3.2* 2.6*  --   NEUTROABS 1.8  --   --   --  2.1  --   --   HGB 6.1*  --  6.6* 7.6* 7.3* 6.8* 9.0*  HCT 17.4*  --  18.9* 21.9* 20.8* 19.0* 25.9*  MCV 89.2  --  90.4 89.0 87.8 88.8  --   PLT <5* 6* 14* 11* 8* 6*  --    Cardiac Enzymes:  Recent Labs Lab 07/09/14 1155  CKTOTAL 1727*   BNP (last 3 results) No results found for this basename: PROBNP,  in the last 8760 hours CBG:  Recent Labs Lab 07/10/14 1147 07/10/14 1621 07/10/14 2150 07/11/14 0742 07/11/14 1154  GLUCAP 107* 174* 229* 75 118*    Recent Results (from the past 240 hour(s))  CLOSTRIDIUM DIFFICILE BY PCR     Status: None   Collection Time    07/09/14 11:06 AM      Result Value Ref Range Status   C difficile by pcr NEGATIVE  NEGATIVE Final   Comment: Performed at [facility]       Studies: Ct Biopsy  07/10/2014   INDICATION: History of multiple myeloma. Please perform CT guided bone marrow biopsy and aspiration.  EXAM: CT-GUIDED BONE MARROW ASPIRATION AND BIOPSY.  MEDICATIONS: None  ANESTHESIA/SEDATION: None  CONTRAST:  None  COMPLICATIONS: None immediate.  PROCEDURE: Informed consent was obtained from the patient following an explanation of the procedure, risks, benefits and alternatives. The patient understands, agrees and consents  for the procedure. All questions were addressed. A time out was performed prior to the initiation of the procedure. The patient was positioned prone and non-contrast localization CT was performed of the pelvis to demonstrate the iliac marrow spaces. The operative site was prepped and draped in the usual sterile fashion.  Under sterile conditions and local anesthesia, a 22 gauge spinal needle was utilized for procedural planning. Next, an 11 gauge coaxial bone biopsy needle was advanced into the left iliac marrow space. Needle position was confirmed with CT imaging. Initially, bone marrow aspiration was performed. Next, a bone marrow biopsy was obtained with the 11 gauge outer bone marrow device. Samples were prepared with the cytotechnologist and deemed adequate. The needle was removed intact. Hemostasis was obtained with compression and a dressing was placed. The patient tolerated the procedure well without immediate post procedural complication.  IMPRESSION: Successful CT guided left iliac bone marrow aspiration and  core biopsies.   Electronically Signed   By: Sandi Mariscal M.D.   On: 07/10/2014 13:04        Scheduled Meds: . allopurinol  150 mg Oral Daily  . calcitRIOL  0.25 mcg Oral Daily  . dexamethasone  40 mg Intravenous Q24H  . famciclovir  250 mg Oral Daily  . insulin aspart  0-9 Units Subcutaneous TID WC  . insulin glargine  40 Units Subcutaneous QHS  . metoprolol  25 mg Oral BID  . IMMUNE GLOBULIN 10% (HUMAN) IV - For Fluid Restriction Only  400 mg/kg Intravenous Q24H  . pantoprazole  40 mg Oral Daily  . sodium chloride  3 mL Intravenous Q12H   Continuous Infusions:    Principal Problem:   Other pancytopenia Active Problems:   Diabetes mellitus due to underlying condition with diabetic polyneuropathy   Hypertension associated with diabetes   Multiple myeloma, in relapse   Diabetes mellitus type 2 with complications   Chronic renal insufficiency, stage IV (severe)   Acute on  chronic renal failure   Hyperkalemia   Diarrhea    Time spent: 49 minutes    Khaleef Ruby, MD, FACP, FHM. [facility] Pager [phone]  If 7PM-7AM, please contact night-coverage [email or web address] Password [email or web address] 07/11/2014, 4:31 PM    LOS: 3 days

## 2014-07-11 NOTE — Progress Notes (Signed)
William Gaines has really had no problems today. He did get 2 units of blood. His hemoglobin was back down. We do not have back his erythropoietin level. I would have to think that his erythropoietin level is good to be very low because of his renal insufficiency.  His platelet count was down to 6000. Again, he had no bleeding.  I got a preliminary result from the pathologist today. She says there might be underlying myelodysplasia. He does have megakaryocytes in his bone marrow. There is about 15% plasma cells. She is doing some special stains.  His appetite is marginal. He is urinating okay. His 24-hour urine shows about a 4400 mg of protein. We will see if this is a monoclonal or not.  I want to do a bone survey on him again.  His iron studies looked okay. His iron saturation was 28%.  His albumin is only 2.2. His calcium is 7. His creatinine is 6.78.  On his physical exam, his blood pressure is 146/66. There is no change on his physical exam.  I now have him on IVIG and Decadron. This is trying to help with any type of immune-based thrombocytopenia. I don't know if I can get him Nplate.  We will continue to follow along.  I spoke to his wife on the phone today.  Pete E.

## 2014-07-11 NOTE — Evaluation (Signed)
Physical Therapy Evaluation Patient Details Name: William Gaines MRN: [id number] DOB: 07/14/1941 Today's Date: 07/11/2014   History of Present Illness  73 year old male with history of kappa light chain myeloma,PET scan 04/21/14 showed stable lytic lesions, DM 2, HTN, CKD 4-5, went to have AV fistula placed on day of admission and was found to have abnormal labs-severe pancytopenia and was referred to ED for admission.  Pt s/p bone marrow biopsy 9/3 and received one unit of PRBC so far today 9/4.  Clinical Impression  Pt currently with functional limitations due to the deficits listed below (see PT Problem List).  Pt will benefit from skilled PT to increase their independence and safety with mobility to allow discharge to the venue listed below.  Pt reports increased fatigue and weakness lately and only able to tolerate short distance ambulation with RW today.  Pt reports having assistive devices at home however does not usually need to use them.     Follow Up Recommendations Home health PT    Equipment Recommendations  None recommended by PT    Recommendations for Other Services       Precautions / Restrictions Precautions Precautions: Fall      Mobility  Bed Mobility               General bed mobility comments: pt up in recliner on arrival  Transfers Overall transfer level: Needs assistance Equipment used: Rolling walker (2 wheeled) Transfers: Sit to/from Stand Sit to Stand: Min guard         General transfer comment: verbal cues for safety  Ambulation/Gait Ambulation/Gait assistance: Min guard Ambulation Distance (Feet): 80 Feet Assistive device: Rolling walker (2 wheeled) Gait Pattern/deviations: Step-through pattern;Decreased stride length;Trunk flexed     General Gait Details: verbal cues for safe use of RW, fatigues quickly  Stairs            Wheelchair Mobility    Modified Rankin (Stroke Patients Only)       Balance                                              Pertinent Vitals/Pain Pain Assessment: No/denies pain    Home Living Family/patient expects to be discharged to:: Private residence Living Arrangements: Spouse/significant other   Type of Home: House Home Access: Stairs to enter Entrance Stairs-Rails: Right;Left;Can reach both Entrance Stairs-Number of Steps: 3-4 Home Layout: One level Home Equipment: Environmental consultant - 2 wheels;Cane - single point      Prior Function Level of Independence: Independent               Hand Dominance        Extremity/Trunk Assessment               Lower Extremity Assessment: Generalized weakness         Communication   Communication: No difficulties  Cognition Arousal/Alertness: Awake/alert Behavior During Therapy: Flat affect Overall Cognitive Status: Within Functional Limits for tasks assessed                      General Comments      Exercises        Assessment/Plan    PT Assessment Patient needs continued PT services  PT Diagnosis Difficulty walking   PT Problem List Decreased strength;Decreased activity tolerance;Decreased mobility;Decreased knowledge of use of DME  PT Treatment  Interventions DME instruction;Gait training;Functional mobility training;Therapeutic activities;Therapeutic exercise;Patient/family education   PT Goals (Current goals can be found in the Care Plan section) Acute Rehab PT Goals PT Goal Formulation: With patient Time For Goal Achievement: 07/18/14 Potential to Achieve Goals: Good    Frequency Min 3X/week   Barriers to discharge        Co-evaluation               End of Session   Activity Tolerance: Patient tolerated treatment well Patient left: in chair;with call bell/phone within reach;with chair alarm set Nurse Communication: Mobility status         Time: [id number] PT Time Calculation (min): 8 min   Charges:   PT Evaluation $Initial PT Evaluation Tier I: 1  Procedure PT Treatments $Gait Training: 8-22 mins   PT G Codes:          Jahaan Vanwagner,KATHrine E 07/11/2014, 12:36 PM Carmelia Bake, PT, DPT 07/11/2014 Pager: [phone]

## 2014-07-12 ENCOUNTER — Inpatient Hospital Stay (HOSPITAL_COMMUNITY): Payer: Medicare Other

## 2014-07-12 LAB — TYPE AND SCREEN
ABO/RH(D): O POS
ANTIBODY SCREEN: NEGATIVE
UNIT DIVISION: 0
UNIT DIVISION: 0
Unit division: 0
Unit division: 0

## 2014-07-12 LAB — RENAL FUNCTION PANEL
Albumin: 2.3 g/dL — ABNORMAL LOW (ref 3.5–5.2)
Anion gap: 18 — ABNORMAL HIGH (ref 5–15)
BUN: 87 mg/dL — ABNORMAL HIGH (ref 6–23)
CO2: 21 meq/L (ref 19–32)
Calcium: 7 mg/dL — ABNORMAL LOW (ref 8.4–10.5)
Chloride: 94 mEq/L — ABNORMAL LOW (ref 96–112)
Creatinine, Ser: 6.35 mg/dL — ABNORMAL HIGH (ref 0.50–1.35)
GFR, EST AFRICAN AMERICAN: 9 mL/min — AB (ref >90)
GFR, EST NON AFRICAN AMERICAN: 8 mL/min — AB (ref >90)
Glucose, Bld: 241 mg/dL — ABNORMAL HIGH (ref 70–99)
Phosphorus: 4.8 mg/dL — ABNORMAL HIGH (ref 2.3–4.6)
Potassium: 4.5 mEq/L (ref 3.7–5.3)
SODIUM: 133 meq/L — AB (ref 137–147)

## 2014-07-12 LAB — OCCULT BLOOD X 1 CARD TO LAB, STOOL: Fecal Occult Bld: POSITIVE — AB

## 2014-07-12 LAB — CBC
HCT: 24.1 % — ABNORMAL LOW (ref 39.0–52.0)
Hemoglobin: 8.4 g/dL — ABNORMAL LOW (ref 13.0–17.0)
MCH: 29.9 pg (ref 26.0–34.0)
MCHC: 34.9 g/dL (ref 30.0–36.0)
MCV: 85.8 fL (ref 78.0–100.0)
PLATELETS: 5 10*3/uL — AB (ref 150–400)
RBC: 2.81 MIL/uL — AB (ref 4.22–5.81)
RDW: 14.2 % (ref 11.5–15.5)
WBC: 3.8 10*3/uL — AB (ref 4.0–10.5)

## 2014-07-12 LAB — BASIC METABOLIC PANEL
Anion gap: 21 — ABNORMAL HIGH (ref 5–15)
BUN: 86 mg/dL — AB (ref 6–23)
CO2: 17 mEq/L — ABNORMAL LOW (ref 19–32)
Calcium: 6.9 mg/dL — ABNORMAL LOW (ref 8.4–10.5)
Chloride: 91 mEq/L — ABNORMAL LOW (ref 96–112)
Creatinine, Ser: 5.94 mg/dL — ABNORMAL HIGH (ref 0.50–1.35)
GFR calc Af Amer: 10 mL/min — ABNORMAL LOW (ref >90)
GFR calc non Af Amer: 8 mL/min — ABNORMAL LOW (ref >90)
GLUCOSE: 424 mg/dL — AB (ref 70–99)
Potassium: 4.4 mEq/L (ref 3.7–5.3)
Sodium: 129 mEq/L — ABNORMAL LOW (ref 137–147)

## 2014-07-12 LAB — URINE CULTURE: Colony Count: 4000

## 2014-07-12 LAB — GLUCOSE, CAPILLARY
Glucose-Capillary: 201 mg/dL — ABNORMAL HIGH (ref 70–99)
Glucose-Capillary: 271 mg/dL — ABNORMAL HIGH (ref 70–99)
Glucose-Capillary: 295 mg/dL — ABNORMAL HIGH (ref 70–99)
Glucose-Capillary: 422 mg/dL — ABNORMAL HIGH (ref 70–99)

## 2014-07-12 MED ORDER — ROMIPLOSTIM 250 MCG ~~LOC~~ SOLR
350.0000 ug | Freq: Once | SUBCUTANEOUS | Status: AC
  Administered 2014-07-12: 350 ug via SUBCUTANEOUS
  Filled 2014-07-12: qty 0.7

## 2014-07-12 MED ORDER — INSULIN ASPART 100 UNIT/ML ~~LOC~~ SOLN
6.0000 [IU] | Freq: Once | SUBCUTANEOUS | Status: AC
  Administered 2014-07-12: 6 [IU] via SUBCUTANEOUS

## 2014-07-12 MED ORDER — INSULIN ASPART 100 UNIT/ML ~~LOC~~ SOLN
0.0000 [IU] | Freq: Three times a day (TID) | SUBCUTANEOUS | Status: DC
  Administered 2014-07-12: 5 [IU] via SUBCUTANEOUS
  Administered 2014-07-12: 3 [IU] via SUBCUTANEOUS
  Administered 2014-07-12: 5 [IU] via SUBCUTANEOUS
  Administered 2014-07-13: 3 [IU] via SUBCUTANEOUS
  Administered 2014-07-13: 2 [IU] via SUBCUTANEOUS
  Administered 2014-07-13: 7 [IU] via SUBCUTANEOUS
  Administered 2014-07-14: 3 [IU] via SUBCUTANEOUS
  Administered 2014-07-14: 5 [IU] via SUBCUTANEOUS

## 2014-07-12 MED ORDER — FUROSEMIDE 80 MG PO TABS
160.0000 mg | ORAL_TABLET | Freq: Once | ORAL | Status: AC
  Administered 2014-07-12: 160 mg via ORAL
  Filled 2014-07-12: qty 2

## 2014-07-12 MED ORDER — ALBUTEROL SULFATE (2.5 MG/3ML) 0.083% IN NEBU
2.5000 mg | INHALATION_SOLUTION | RESPIRATORY_TRACT | Status: DC | PRN
  Administered 2014-07-12: 2.5 mg via RESPIRATORY_TRACT

## 2014-07-12 MED ORDER — INSULIN ASPART 100 UNIT/ML ~~LOC~~ SOLN: 0.0000 [IU] | Freq: Every day | SUBCUTANEOUS | Status: DC

## 2014-07-12 MED ORDER — INSULIN GLARGINE 100 UNIT/ML ~~LOC~~ SOLN
45.0000 [IU] | Freq: Every day | SUBCUTANEOUS | Status: DC
  Administered 2014-07-12: 45 [IU] via SUBCUTANEOUS
  Filled 2014-07-12: qty 0.45

## 2014-07-12 MED ORDER — DARBEPOETIN ALFA-POLYSORBATE 300 MCG/0.6ML IJ SOLN: 300.0000 ug | Freq: Once | INTRAMUSCULAR | Status: DC

## 2014-07-12 NOTE — Progress Notes (Signed)
Subjective: Interval History: has complaints swelling, not resting well, many bruises.  Objective: Vital signs in last 24 hours: Temp:  [97.2 F (36.2 C)-98.7 F (37.1 C)] 98.1 F (36.7 C) (09/05 0532) Pulse Rate:  [80-96] 86 (09/05 0532) Resp:  [16-20] 20 (09/05 0532) BP: (113-150)/(56-77) 132/75 mmHg (09/05 0532) SpO2:  [97 %-100 %] 99 % (09/05 0532) FiO2 (%):  [2 %] 2 % (09/05 0532) Weight:  [118.389 kg (261 lb)] 118.389 kg (261 lb) (09/05 0300) Weight change:   Intake/Output from previous day: 09/04 0701 - 09/05 0700 In: 2072.5 [P.O.:720; Blood:1352.5] Out: 1760 [Urine:1760] Intake/Output this shift: Total I/O In: 360 [P.O.:360] Out: -   General appearance: alert, cooperative, moderately obese, pale and many bruises Resp: diminished breath sounds bilaterally, rales bibasilar and rhonchi bibasilar Cardio: S1, S2 normal and systolic murmur: holosystolic 2/6, blowing at apex GI: pos bs, liver down 6 cm, umb hernia Extremities: 3+  Lab Results:  Recent Labs  07/11/14 0410 07/11/14 1534 07/12/14 0410  WBC 2.6*  --  3.8*  HGB 6.8* 9.0* 8.4*  HCT 19.0* 25.9* 24.1*  PLT 6*  --  5*   BMET:  Recent Labs  07/11/14 0410 07/12/14 0410  NA 140 133*  K 3.9 4.5  CL 100 94*  CO2 25 21  GLUCOSE 96 241*  BUN 88* 87*  CREATININE 6.78* 6.35*  CALCIUM 7.0* 7.0*    Recent Labs  07/09/14 1438  PTH 319*   Iron Studies:  Recent Labs  07/10/14 0425  IRON 58  TIBC 208*    Studies/Results: Dg Bone Survey Met  07/12/2014   CLINICAL DATA:  Multiple myeloma  EXAM: METASTATIC BONE SURVEY  COMPARISON:  PET-CT dated 04/21/2014.  Bone survey dated 11/29/2013.  FINDINGS: 2.4 cm lytic lesion in the calvarium, grossly unchanged.  Degenerative changes of the cervical, thoracic, and lumbar spine. Stable postsurgical changes involving the lumbar spine.  Expansile mixed sclerotic and lytic lesion involving the left iliac crest. 1.7 cm lytic lesion in the left inferior pubic ramus,  unchanged.  Visualized bilateral upper and lower extremities are within normal limits.  Lungs are grossly clear.  Heart is normal in size.  IMPRESSION: Stable lytic lesions in the calvarium and left inferior pubic ramus.  Stable mixed sclerotic and lytic lesion in the left iliac crest.  No evidence of new lesions in the visualized axial and appendicular skeleton.   Electronically Signed   By: Julian Hy M.D.   On: 07/12/2014 09:45    I have reviewed the patient's current medications.  Assessment/Plan: 1 CKD 5 vol xs, mild acidemia, mild uremia.  Will see if can diurese some.  IF need dialysis , will need PC, and that is complic by ptlt .  Will hold on dialysis for now 2 Pancytopenia per Onc 3 ?ITP 4 MM 5 DM controlled 6 HTN not an issue 7 Obesity P Lasix, follow Cr ,uremic sx, bicarb, counts    LOS: 4 days   Annalese Stiner L 07/12/2014,10:48 AM

## 2014-07-12 NOTE — Progress Notes (Signed)
He had a little bit of a tough night. He says he did not sleep all that much.  He had one dose of IVIG. His same doses today.  He  did receive a dose of Aranesp already.  I am checking to see if we can give Nplate.  He is having some blood-tinged sputum. He is over her chest x-ray today. There's been no fever.  Has not had any obvious bleeding.  His platelet count today is 5000. His hemoglobin was 8.4. His white cell count is 3.8.  Again, the bone marrow results probably will not be out, at the earliest, until Tuesday.  His renal function is actually getting a little better. His creatinine is down to 6.35.  His appetite is still down a little bit. He says that he will go outside  today. I this would be very nice for him to do this.  His vital signs are all stable. Blood pressure 130/75. Temperature 98.1. Pulse is 86. His lungs show some scattered rhonchi bilaterally. He has a decent air movement. He has no wheezing. Cardiac exam regular rate and rhythm with no murmurs rubs or bruits. Abdomen is soft. He has good bowel sounds. His obese. Extremities shows chronic 2+ edema in his lower legs. Skin exam shows numerous ecchymoses. He has chronic skin damage from is status post her.  The pharmacy does have Nplate. I will give him a dose today.  Again, the a bone marrow is going to be the key.  We will continue follow along.  Pete E.  1 Corinthians 15:58

## 2014-07-12 NOTE — Progress Notes (Addendum)
PROGRESS NOTE    KARDER GOODIN GYI:[id number] DOB: 10-12-1941 DOA: 07/08/2014 PCP: Cristine Polio, MD  HPI/Brief narrative 73 year old male with history of kappa light chain myeloma, recently 04/15/14 with bone marrow biopsy which showed no evidence of active myeloma and marrow was hypercellular, PET scan 04/21/14 showed stable lytic lesions, not an active treatment for myeloma, DM 2, HTN, CKD 4-5, went to have AV fistula placed on day of admission and was found to have abnormal labs-severe pancytopenia and was referred to ED for admission.   Assessment/Plan:  1. Pancytopenia: Hematology following. Peripheral smear not consistent with TTP or DIC. Status post 2 PRBC & 1 platelet transfusion. Hb improved from 6.1>7.6 >7.3 and platelets from <5 >11 > 8. Mx per Oncology- appreciate their assistance. Bone marrow biopsy performed 9/3-await results-may not be back until Tuesday next week. Hematology suspecting immune mediated thrombocytopenia but not sure if it will explain his leukopenia. Transfuse PRBC if hemoglobin less than 7 g per DL. Transfuse platelets only if bleeding. Hemoglobin dropped to 6.8 on 9/4- s/p 2 PRBCs 9/4 with improvement. Starting Nplate 9/5. Minimal blood-tinged sputum. Check chest x-ray. Also started on steroids. Status post IVIG x1 2. Acute on Stage 5 CKD; worsened by anemia, lasix, ACEI, NSAID's(received Toradol here) and hemodynamics. Rule out cast nephropathy. Renal ultrasound without hydronephrosis. Nephrology following. Avoid nephrotoxic agents/medications. Creatinine gradually decreasing. Mild uremic symptoms. IV fluid discontinued couple days back. IV Lasix for diuresis. 3. Hyperkalemia: Likely secondary to worsening renal insufficiency and blood transfusion. Improved to 5.3 after a dose of Kayexalate. Gentle IV fluids. Resolved. Monitor closely. 4. Diarrhea: Seems to have resolved. C. difficile PCR negative. 5. Kappa light chain myeloma: Management per oncology. Skeletal  survey-no new lytic lesions. Continues to have previous lytic lesions. 6. Hypertension: Soft blood pressures. Reduced metoprolol. Better. 7. Type II DM with renal complications: Fluctuating CBGs and had a hypoglycemic episode on 9/1. Reduce Lantus and DC bedtime sliding scale. Monitor closely. No further hypoglycemic episodes and fluctuating CBGs. CBG starting to climb 2/2 steroids - increase Lantus. 8. History of gout: No acute flare. 9. Metabolic bone disease: Management per nephrology. 10. Bilateral renal masses: Bilateral round hypoechoic masses seen on renal ultrasound-suspected complicated cysts-consider MRI-will discuss with nephrology.   Code Status: Full Family Communication: Discussed with spouse on 9/4. Disposition Plan: Home when medically stable   Consultants:  Oncology  Nephrology  Procedures:  Bone marrow biopsy 9/3  Antibiotics:  None   Subjective: Patient states that he slept all day yesterday and hence was unable to sleep last night. Denies dyspnea. Has cough with intermittent pink sputum.  Objective: Filed Vitals:   07/11/14 2101 07/12/14 0300 07/12/14 0532 07/12/14 1314  BP: 114/57  132/75 147/72  Pulse: 80  86 78  Temp: 98.2 F (36.8 C)  98.1 F (36.7 C) 98.5 F (36.9 C)  TempSrc: Oral  Oral Oral  Resp: _0 Height:      Weight:  118.389 kg (261 lb)    SpO2: 99%  99% 97%    Intake/Output Summary (Last 24 hours) at 07/12/14 1342 Last data filed at 07/12/14 1300  Gross per 24 hour  Intake   1970 ml  Output   1250 ml  Net    720 ml   Filed Weights   07/08/14 1735 07/12/14 0300  Weight: 114.306 kg (252 lb) 118.389 kg (261 lb)     Exam:  General exam: Pleasant elderly male sitting up in bed seen eating breakfast. Respiratory  system: Clear. No increased work of breathing. Cardiovascular system: S1 & S2 heard, RRR. No JVD, murmurs, gallops, clicks. Trace bilateral leg edema.  Gastrointestinal system: Abdomen is nondistended, soft and  nontender. Normal bowel sounds heard. Central nervous system: Alert and oriented. No focal neurological deficits. Extremities: Symmetric 5 x 5 power.   Data Reviewed: Basic Metabolic Panel:  Recent Labs Lab 07/09/14 0431 07/09/14 1153 07/10/14 0425 07/11/14 0410 07/12/14 0410  NA 138 141 137 140 133*  K 6.6* 5.3 4.9 3.9 4.5  CL 106 107 101 100 94*  CO2 _0 GLUCOSE 135* 102* 97 96 241*  BUN 81* 83* 89* 88* 87*  CREATININE 6.43* 6.87* 6.91* 6.78* 6.35*  CALCIUM 7.7* 7.9* 7.3* 7.0* 7.0*  PHOS  --   --  6.4* 6.1* 4.8*   Liver Function Tests:  Recent Labs Lab 07/08/14 1315 07/10/14 0425 07/11/14 0410 07/12/14 0410  AST 20 70*  --   --   ALT 16 33  --   --   ALKPHOS 63 59  --   --   BILITOT 0.2* 0.4  --   --   PROT 6.5 6.1  --   --   ALBUMIN 2.5* 2.3* 2.2* 2.3*   No results found for this basename: LIPASE, AMYLASE,  in the last 168 hours No results found for this basename: AMMONIA,  in the last 168 hours CBC:  Recent Labs Lab 07/08/14 1315  07/09/14 0431 07/09/14 1153 07/10/14 0425 07/11/14 0410 07/11/14 1534 07/12/14 0410  WBC 2.4*  --  2.7* 3.6* 3.2* 2.6*  --  3.8*  NEUTROABS 1.8  --   --   --  2.1  --   --   --   HGB 6.1*  --  6.6* 7.6* 7.3* 6.8* 9.0* 8.4*  HCT 17.4*  --  18.9* 21.9* 20.8* 19.0* 25.9* 24.1*  MCV 89.2  --  90.4 89.0 87.8 88.8  --  85.8  PLT <5*  < > 14* 11* 8* 6*  --  5*  < > = values in this interval not displayed. Cardiac Enzymes:  Recent Labs Lab 07/09/14 1155  CKTOTAL 1727*   BNP (last 3 results) No results found for this basename: PROBNP,  in the last 8760 hours CBG:  Recent Labs Lab 07/11/14 1154 07/11/14 1713 07/11/14 2158 07/12/14 0756 07/12/14 1130  GLUCAP 118* 179* 323* 201* 271*    Recent Results (from the past 240 hour(s))  CLOSTRIDIUM DIFFICILE BY PCR     Status: None   Collection Time    07/09/14 11:06 AM      Result Value Ref Range Status   C difficile by pcr NEGATIVE  NEGATIVE Final    Comment: Performed at [facility]     Status: None   Collection Time    07/10/14 11:04 PM      Result Value Ref Range Status   Specimen Description URINE, RANDOM   Final   Special Requests none   Final   Culture  Setup Time     Final   Value: 07/11/2014 01:20     Performed at [facility]     Final   Value: 4,000 COLONIES/ML     Performed at [facility]   Culture     Final   Value: INSIGNIFICANT GROWTH     Performed at [facility]   Report Status 07/12/2014 FINAL   Final  Studies: Dg Bone Survey Met  07/12/2014   CLINICAL DATA:  Multiple myeloma  EXAM: METASTATIC BONE SURVEY  COMPARISON:  PET-CT dated 04/21/2014.  Bone survey dated 11/29/2013.  FINDINGS: 2.4 cm lytic lesion in the calvarium, grossly unchanged.  Degenerative changes of the cervical, thoracic, and lumbar spine. Stable postsurgical changes involving the lumbar spine.  Expansile mixed sclerotic and lytic lesion involving the left iliac crest. 1.7 cm lytic lesion in the left inferior pubic ramus, unchanged.  Visualized bilateral upper and lower extremities are within normal limits.  Lungs are grossly clear.  Heart is normal in size.  IMPRESSION: Stable lytic lesions in the calvarium and left inferior pubic ramus.  Stable mixed sclerotic and lytic lesion in the left iliac crest.  No evidence of new lesions in the visualized axial and appendicular skeleton.   Electronically Signed   By: Julian Hy M.D.   On: 07/12/2014 09:45        Scheduled Meds: . allopurinol  150 mg Oral Daily  . calcitRIOL  0.25 mcg Oral Daily  . dexamethasone  40 mg Intravenous Q24H  . famciclovir  250 mg Oral Daily  . furosemide  160 mg Oral Once  . insulin aspart  0-5 Units Subcutaneous QHS  . insulin aspart  0-9 Units Subcutaneous TID WC  . insulin glargine  40 Units Subcutaneous QHS  . metoprolol  25 mg Oral BID  . IMMUNE GLOBULIN 10% (HUMAN) IV - For Fluid  Restriction Only  400 mg/kg Intravenous Q24H  . pantoprazole  40 mg Oral Daily  . sodium chloride  3 mL Intravenous Q12H   Continuous Infusions:    Principal Problem:   Other pancytopenia Active Problems:   Diabetes mellitus due to underlying condition with diabetic polyneuropathy   Hypertension associated with diabetes   Multiple myeloma, in relapse   Diabetes mellitus type 2 with complications   Chronic renal insufficiency, stage IV (severe)   Acute on chronic renal failure   Hyperkalemia   Diarrhea    Time spent: 1 minutes    Omarie Parcell, MD, FACP, FHM. [facility] Pager [phone]  If 7PM-7AM, please contact night-coverage [email or web address] Password [email or web address] 07/12/2014, 1:42 PM    LOS: 4 days

## 2014-07-12 NOTE — Progress Notes (Addendum)
Went in to check on pt and he is sitting up in chair wheezing but denies SOB, lower extremities 3+ pitting edema, oxygen sats 92% on RA. Pt has been encouraged to elevate his legs all night or at least recline them since he insists on sleeping in the chair. Pt has gotten up to go to the bathroom several times in attempts to have a BM but has not had anything except for gas. Pt legs look more swollen to me than at the beginning of the shift. 2LNC applied for comfort. MD notified of pts status. Orders were given and they have been initiated.

## 2014-07-13 DIAGNOSIS — I10 Essential (primary) hypertension: Secondary | ICD-10-CM

## 2014-07-13 DIAGNOSIS — D696 Thrombocytopenia, unspecified: Secondary | ICD-10-CM

## 2014-07-13 DIAGNOSIS — E1169 Type 2 diabetes mellitus with other specified complication: Secondary | ICD-10-CM

## 2014-07-13 LAB — RENAL FUNCTION PANEL
Albumin: 2.2 g/dL — ABNORMAL LOW (ref 3.5–5.2)
Anion gap: 15 (ref 5–15)
BUN: 87 mg/dL — ABNORMAL HIGH (ref 6–23)
CHLORIDE: 98 meq/L (ref 96–112)
CO2: 22 meq/L (ref 19–32)
CREATININE: 6.01 mg/dL — AB (ref 0.50–1.35)
Calcium: 7.2 mg/dL — ABNORMAL LOW (ref 8.4–10.5)
GFR calc non Af Amer: 8 mL/min — ABNORMAL LOW (ref >90)
GFR, EST AFRICAN AMERICAN: 10 mL/min — AB (ref >90)
Glucose, Bld: 177 mg/dL — ABNORMAL HIGH (ref 70–99)
Phosphorus: 4.9 mg/dL — ABNORMAL HIGH (ref 2.3–4.6)
Potassium: 4.3 mEq/L (ref 3.7–5.3)
Sodium: 135 mEq/L — ABNORMAL LOW (ref 137–147)

## 2014-07-13 LAB — GLUCOSE, CAPILLARY
GLUCOSE-CAPILLARY: 401 mg/dL — AB (ref 70–99)
Glucose-Capillary: 171 mg/dL — ABNORMAL HIGH (ref 70–99)
Glucose-Capillary: 224 mg/dL — ABNORMAL HIGH (ref 70–99)
Glucose-Capillary: 304 mg/dL — ABNORMAL HIGH (ref 70–99)

## 2014-07-13 LAB — CBC
HCT: 24 % — ABNORMAL LOW (ref 39.0–52.0)
HEMOGLOBIN: 8.4 g/dL — AB (ref 13.0–17.0)
MCH: 30.1 pg (ref 26.0–34.0)
MCHC: 35 g/dL (ref 30.0–36.0)
MCV: 86 fL (ref 78.0–100.0)
Platelets: 9 10*3/uL — CL (ref 150–400)
RBC: 2.79 MIL/uL — AB (ref 4.22–5.81)
RDW: 14.1 % (ref 11.5–15.5)
WBC: 5.1 10*3/uL (ref 4.0–10.5)

## 2014-07-13 LAB — GLUCOSE, RANDOM: GLUCOSE: 420 mg/dL — AB (ref 70–99)

## 2014-07-13 MED ORDER — FUROSEMIDE 80 MG PO TABS
80.0000 mg | ORAL_TABLET | Freq: Two times a day (BID) | ORAL | Status: DC
  Administered 2014-07-13 – 2014-07-15 (×4): 80 mg via ORAL
  Filled 2014-07-13 (×6): qty 1

## 2014-07-13 MED ORDER — INSULIN GLARGINE 100 UNIT/ML ~~LOC~~ SOLN
50.0000 [IU] | Freq: Every day | SUBCUTANEOUS | Status: DC
  Administered 2014-07-13 – 2014-07-14 (×2): 50 [IU] via SUBCUTANEOUS
  Filled 2014-07-13 (×3): qty 0.5

## 2014-07-13 MED ORDER — INSULIN ASPART 100 UNIT/ML ~~LOC~~ SOLN
4.0000 [IU] | Freq: Three times a day (TID) | SUBCUTANEOUS | Status: DC
  Administered 2014-07-13 – 2014-07-14 (×3): 4 [IU] via SUBCUTANEOUS

## 2014-07-13 MED ORDER — INSULIN ASPART 100 UNIT/ML ~~LOC~~ SOLN
5.0000 [IU] | Freq: Once | SUBCUTANEOUS | Status: AC
  Administered 2014-07-13: 5 [IU] via SUBCUTANEOUS

## 2014-07-13 NOTE — Progress Notes (Signed)
William Gaines   DOB:25-Jan-1941   PF#:[id number]    Subjective: He states he feels well. He has mild productive cough with blood-tinged sputum. He bruises easily. The patient denies any recent signs or symptoms of bleeding such as spontaneous epistaxis, hematuria or hematochezia.  Objective:  Filed Vitals:   07/13/14 1330  BP: 133/70  Pulse: 96  Temp: 98 F (36.7 C)  Resp: 18     Intake/Output Summary (Last 24 hours) at 07/13/14 1402 Last data filed at 07/13/14 1331  Gross per 24 hour  Intake    960 ml  Output   3106 ml  Net  -2146 ml    GENERAL:alert, no distress and comfortable SKIN: Extensive petechiae rash is noted. EYES: normal, Conjunctiva are pink and non-injected, sclera clear OROPHARYNX:no exudate, no erythema and lips, buccal mucosa, and tongue normal  NECK: supple, thyroid normal size, non-tender, without nodularity LYMPH:  no palpable lymphadenopathy in the cervical, axillary or inguinal LUNGS: clear to auscultation and percussion with normal breathing effort HEART: regular rate & rhythm and no murmurs with moderate bilateral lower extremity edema ABDOMEN:abdomen soft, non-tender and normal bowel sounds Musculoskeletal:no cyanosis of digits and no clubbing  NEURO: alert & oriented x 3 with fluent speech, no focal motor/sensory deficits   Labs:  Lab Results  Component Value Date   WBC 5.1 07/13/2014   HGB 8.4* 07/13/2014   HCT 24.0* 07/13/2014   MCV 86.0 07/13/2014   PLT 9* 07/13/2014   NEUTROABS 2.1 07/10/2014    Lab Results  Component Value Date   NA 135* 07/13/2014   K 4.3 07/13/2014   CL 98 07/13/2014   CO2 22 07/13/2014    Studies:  Dg Chest Port 1 View  07/12/2014   CLINICAL DATA:  Hemoptysis  EXAM: PORTABLE CHEST - 1 VIEW  COMPARISON:  830 hr  FINDINGS: Heterogeneous bar predominately interstitial opacities are present throughout both lungs worrisome for diffuse pneumonia or edema. Mild cardiomegaly. No pneumothorax.  IMPRESSION: Findings most consistent with  diffuse edema or pneumonia.   Electronically Signed   By: Maryclare Bean M.D.   On: 07/12/2014 14:33   Dg Bone Survey Met  07/12/2014   CLINICAL DATA:  Multiple myeloma  EXAM: METASTATIC BONE SURVEY  COMPARISON:  PET-CT dated 04/21/2014.  Bone survey dated 11/29/2013.  FINDINGS: 2.4 cm lytic lesion in the calvarium, grossly unchanged.  Degenerative changes of the cervical, thoracic, and lumbar spine. Stable postsurgical changes involving the lumbar spine.  Expansile mixed sclerotic and lytic lesion involving the left iliac crest. 1.7 cm lytic lesion in the left inferior pubic ramus, unchanged.  Visualized bilateral upper and lower extremities are within normal limits.  Lungs are grossly clear.  Heart is normal in size.  IMPRESSION: Stable lytic lesions in the calvarium and left inferior pubic ramus.  Stable mixed sclerotic and lytic lesion in the left iliac crest.  No evidence of new lesions in the visualized axial and appendicular skeleton.   Electronically Signed   By: Julian Hy M.D.   On: 07/12/2014 09:45    Assessment & Plan:  #1 severe thrombocytopenia He had received IVIG, high-dose dexamethasone and Nplate. With recent blood tinged sputum, I would prefer to transfuse him especially with his platelet count less than 10,000. I discussed with the patient the risks, benefits, side effects of transfusion and he declined platelet transfusion. He wants to discuss with his hematologist tomorrow. #2 anemia This is likely anemia of chronic disease and underlying bone marrow disease. The  patient denies recent history of bleeding such as epistaxis, hematuria or hematochezia. He is asymptomatic from the anemia. We will observe for now.  He does not require transfusion now.  #3 multiple myeloma Metastatic skeletal survey showed mixed lesions throughout. Bone marrow biopsy is pending. Continue supportive care. He remains on high-dose dexamethasone #4 diabetes He will continue on insulin coverage while on  high dose dexamethasone #5 chronic kidney disease He continues to have signs of worsening renal failure. He is being followed by nephrologist. Continue to encourage him to increase oral fluid intake as tolerated.  [facility], [place], MD 07/13/2014  2:02 PM

## 2014-07-13 NOTE — Progress Notes (Signed)
Subjective: Interval History: has complaints anxious to go home.  Objective: Vital signs in last 24 hours: Temp:  [97.3 F (36.3 C)-98.5 F (36.9 C)] 97.6 F (36.4 C) (09/06 0626) Pulse Rate:  [78-101] 84 (09/06 0626) Resp:  [20] 20 (09/06 0626) BP: (134-160)/(64-82) 136/79 mmHg (09/06 0626) SpO2:  [97 %-100 %] 99 % (09/06 0626) Weight:  [116.9 kg (257 lb 11.5 oz)] 116.9 kg (257 lb 11.5 oz) (09/06 0626) Weight change: -1.489 kg (-3 lb 4.5 oz)  Intake/Output from previous day: 09/05 0701 - 09/06 0700 In: 1080 [P.O.:1080] Out: 3055 [Urine:3055] Intake/Output this shift: Total I/O In: 240 [P.O.:240] Out: -   General appearance: alert, cooperative, no distress, morbidly obese, pale and diffuse bruising Resp: diminished breath sounds bilaterally and rales bibasilar Cardio: S1, S2 normal and systolic murmur: holosystolic 2/6, blowing at apex GI: obese,pos bs, umb hernia, liver down 6 cm Extremities: edema 3+  Lab Results:  Recent Labs  07/12/14 0410 07/13/14 0544  WBC 3.8* 5.1  HGB 8.4* 8.4*  HCT 24.1* 24.0*  PLT 5* 9*   BMET:  Recent Labs  07/12/14 2057 07/13/14 0544  NA 129* 135*  K 4.4 4.3  CL 91* 98  CO2 17* 22  GLUCOSE 424* 177*  BUN 86* 87*  CREATININE 5.94* 6.01*  CALCIUM 6.9* 7.2*   No results found for this basename: PTH,  in the last 72 hours Iron Studies: No results found for this basename: IRON, TIBC, TRANSFERRIN, FERRITIN,  in the last 72 hours  Studies/Results: Dg Chest Port 1 View  07/12/2014   CLINICAL DATA:  Hemoptysis  EXAM: PORTABLE CHEST - 1 VIEW  COMPARISON:  830 hr  FINDINGS: Heterogeneous bar predominately interstitial opacities are present throughout both lungs worrisome for diffuse pneumonia or edema. Mild cardiomegaly. No pneumothorax.  IMPRESSION: Findings most consistent with diffuse edema or pneumonia.   Electronically Signed   By: Maryclare Bean M.D.   On: 07/12/2014 14:33   Dg Bone Survey Met  07/12/2014   CLINICAL DATA:  Multiple  myeloma  EXAM: METASTATIC BONE SURVEY  COMPARISON:  PET-CT dated 04/21/2014.  Bone survey dated 11/29/2013.  FINDINGS: 2.4 cm lytic lesion in the calvarium, grossly unchanged.  Degenerative changes of the cervical, thoracic, and lumbar spine. Stable postsurgical changes involving the lumbar spine.  Expansile mixed sclerotic and lytic lesion involving the left iliac crest. 1.7 cm lytic lesion in the left inferior pubic ramus, unchanged.  Visualized bilateral upper and lower extremities are within normal limits.  Lungs are grossly clear.  Heart is normal in size.  IMPRESSION: Stable lytic lesions in the calvarium and left inferior pubic ramus.  Stable mixed sclerotic and lytic lesion in the left iliac crest.  No evidence of new lesions in the visualized axial and appendicular skeleton.   Electronically Signed   By: Julian Hy M.D.   On: 07/12/2014 09:45    I have reviewed the patient's current medications.  Assessment/Plan: 1 CKD 5 stable to better.  Acid base improving with bicarb and ^ GFR.  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  Can go home soon if stable.  Hopefully Ptlt ^ so can get access as will need dialysis soon    LOS: 5 days   Lark Langenfeld L 07/13/2014,10:23 AM

## 2014-07-13 NOTE — Progress Notes (Addendum)
PROGRESS NOTE    William Gaines FKC:[id number] DOB: [birth year] DOA: 07/08/2014 PCP: Cristine Polio, MD  HPI/Brief narrative 73 year old male with history of kappa light chain myeloma, recently 04/15/14 with bone marrow biopsy which showed no evidence of active myeloma and marrow was hypercellular, PET scan 04/21/14 showed stable lytic lesions, not an active treatment for myeloma, DM 2, HTN, CKD 4-5, went to have AV fistula placed on day of admission and was found to have abnormal labs-severe pancytopenia and was referred to ED for admission.   Assessment/Plan:  1. Pancytopenia: Hematology following. Peripheral smear not consistent with TTP or DIC. Status post 4 PRBC & 1 platelet transfusion since admission. Bone marrow biopsy performed 9/3-await results-may not be back until 9/8. Hematology suspecting immune mediated thrombocytopenia but not sure if it will explain his leukopenia. Transfuse PRBC if hemoglobin less than 7 g per DL. Transfuse platelets only if bleeding. Received Nplate x1 dose on 9/5. Minimal blood-tinged sputum. Chest x-ray suggests pulmonary edema-pneumonia unlikely in the absence of productive sputum or fever. Also started on steroids. Status post IVIG x1. Platelets slightly better from 5 > 9 and hemoglobin stable. 2. Acute on Stage 5 CKD; worsened by anemia, lasix, ACEI, NSAID's(received Toradol here) and hemodynamics. Rule out cast nephropathy. Renal ultrasound without hydronephrosis. Nephrology following. Avoid nephrotoxic agents/medications. Creatinine stable. Mild uremic symptoms. IV fluid discontinued couple days back. IV Lasix for diuresis.  3. Hyperkalemia: Likely secondary to worsening renal insufficiency and blood transfusion. Improved to 5.3 after a dose of Kayexalate. Resolved. 4. Diarrhea: resolved. C. difficile PCR negative. 5. Kappa light chain myeloma: Management per oncology. Skeletal survey-no new lytic lesions. Continues to have previous lytic  lesions. 6. Hypertension: Soft blood pressures. Reduced metoprolol. Better. 7. Type II DM with renal complications: had a hypoglycemic episode on 9/1 and his insulins had been reduced. However with initiation of steroids, worsening of CBGs-will adjust insulins.  8. History of gout: No acute flare. 9. Metabolic bone disease: Management per nephrology. 10. Bilateral renal masses: Bilateral round hypoechoic masses seen on renal ultrasound-suspected complicated cysts-discussed with nephrology and this can be followed with non-urgent/elective MRI as outpatient.  Code Status: Full Family Communication: Discussed with spouse on 9/4. Disposition Plan: Home when medically stable   Consultants:  Oncology  Nephrology  Procedures:  Bone marrow biopsy 9/3  Antibiotics:  None   Subjective: Less cough with intermittent pink sputum. Urinated more yesterday. No SOB.  Objective: Filed Vitals:   07/12/14 2149 07/12/14 2318 07/13/14 0016 07/13/14 0626  BP: 160/82 150/68 144/80 136/79  Pulse: 99 90 96 84  Temp: 97.8 F (36.6 C) 97.3 F (36.3 C) 97.5 F (36.4 C) 97.6 F (36.4 C)  TempSrc: Oral Oral Oral Oral  Resp: $Remo'20 20 20 20  'JzsDc$ Height:      Weight:    116.9 kg (257 lb 11.5 oz)  SpO2: 100% 99% 100% 99%    Intake/Output Summary (Last 24 hours) at 07/13/14 1136 Last data filed at 07/13/14 0900  Gross per 24 hour  Intake    960 ml  Output   3055 ml  Net  -2095 ml   Filed Weights   07/08/14 1735 07/12/14 0300 07/13/14 0626  Weight: 114.306 kg (252 lb) 118.389 kg (261 lb) 116.9 kg (257 lb 11.5 oz)     Exam:  General exam: Pleasant elderly male sitting up in chair. Respiratory system: Clear. No increased work of breathing. Cardiovascular system: S1 & S2 heard, RRR. No JVD, murmurs, gallops, clicks. Trace bilateral leg  edema.  Gastrointestinal system: Abdomen is nondistended, soft and nontender. Normal bowel sounds heard. Central nervous system: Alert and oriented. No focal  neurological deficits. Extremities: Symmetric 5 x 5 power.   Data Reviewed: Basic Metabolic Panel:  Recent Labs Lab 07/09/14 1153 07/10/14 0425 07/11/14 0410 07/12/14 0410 07/12/14 2057 07/13/14 0544  NA 141 137 140 133* 129* 135*  K 5.3 4.9 3.9 4.5 4.4 4.3  CL 107 101 100 94* 91* 98  CO2 $Re'19 20 25 21 'sLa$ 17* 22  GLUCOSE 102* 97 96 241* 424* 177*  BUN 83* 89* 88* 87* 86* 87*  CREATININE 6.87* 6.91* 6.78* 6.35* 5.94* 6.01*  CALCIUM 7.9* 7.3* 7.0* 7.0* 6.9* 7.2*  PHOS  --  6.4* 6.1* 4.8*  --  4.9*   Liver Function Tests:  Recent Labs Lab 07/08/14 1315 07/10/14 0425 07/11/14 0410 07/12/14 0410 07/13/14 0544  AST 20 70*  --   --   --   ALT 16 33  --   --   --   ALKPHOS 63 59  --   --   --   BILITOT 0.2* 0.4  --   --   --   PROT 6.5 6.1  --   --   --   ALBUMIN 2.5* 2.3* 2.2* 2.3* 2.2*   No results found for this basename: LIPASE, AMYLASE,  in the last 168 hours No results found for this basename: AMMONIA,  in the last 168 hours CBC:  Recent Labs Lab 07/08/14 1315  07/09/14 1153 07/10/14 0425 07/11/14 0410 07/11/14 1534 07/12/14 0410 07/13/14 0544  WBC 2.4*  < > 3.6* 3.2* 2.6*  --  3.8* 5.1  NEUTROABS 1.8  --   --  2.1  --   --   --   --   HGB 6.1*  < > 7.6* 7.3* 6.8* 9.0* 8.4* 8.4*  HCT 17.4*  < > 21.9* 20.8* 19.0* 25.9* 24.1* 24.0*  MCV 89.2  < > 89.0 87.8 88.8  --  85.8 86.0  PLT <5*  < > 11* 8* 6*  --  5* 9*  < > = values in this interval not displayed. Cardiac Enzymes:  Recent Labs Lab 07/09/14 1155  CKTOTAL 1727*   BNP (last 3 results) No results found for this basename: PROBNP,  in the last 8760 hours CBG:  Recent Labs Lab 07/12/14 0756 07/12/14 1130 07/12/14 1741 07/12/14 2050 07/13/14 0741  GLUCAP 201* 271* 295* 422* 171*    Recent Results (from the past 240 hour(s))  CLOSTRIDIUM DIFFICILE BY PCR     Status: None   Collection Time    07/09/14 11:06 AM      Result Value Ref Range Status   C difficile by pcr NEGATIVE  NEGATIVE Final    Comment: Performed at [facility]     Status: None   Collection Time    07/10/14 11:04 PM      Result Value Ref Range Status   Specimen Description URINE, RANDOM   Final   Special Requests none   Final   Culture  Setup Time     Final   Value: 07/11/2014 01:20     Performed at [facility]     Final   Value: 4,000 COLONIES/ML     Performed at [facility]   Culture     Final   Value: INSIGNIFICANT GROWTH     Performed at [facility]   Report Status  07/12/2014 FINAL   Final       Studies: Dg Chest Port 1 View  07/12/2014   CLINICAL DATA:  Hemoptysis  EXAM: PORTABLE CHEST - 1 VIEW  COMPARISON:  830 hr  FINDINGS: Heterogeneous bar predominately interstitial opacities are present throughout both lungs worrisome for diffuse pneumonia or edema. Mild cardiomegaly. No pneumothorax.  IMPRESSION: Findings most consistent with diffuse edema or pneumonia.   Electronically Signed   By: Maryclare Bean M.D.   On: 07/12/2014 14:33   Dg Bone Survey Met  07/12/2014   CLINICAL DATA:  Multiple myeloma  EXAM: METASTATIC BONE SURVEY  COMPARISON:  PET-CT dated 04/21/2014.  Bone survey dated 11/29/2013.  FINDINGS: 2.4 cm lytic lesion in the calvarium, grossly unchanged.  Degenerative changes of the cervical, thoracic, and lumbar spine. Stable postsurgical changes involving the lumbar spine.  Expansile mixed sclerotic and lytic lesion involving the left iliac crest. 1.7 cm lytic lesion in the left inferior pubic ramus, unchanged.  Visualized bilateral upper and lower extremities are within normal limits.  Lungs are grossly clear.  Heart is normal in size.  IMPRESSION: Stable lytic lesions in the calvarium and left inferior pubic ramus.  Stable mixed sclerotic and lytic lesion in the left iliac crest.  No evidence of new lesions in the visualized axial and appendicular skeleton.   Electronically Signed   By: Julian Hy M.D.   On: 07/12/2014 09:45         Scheduled Meds: . allopurinol  150 mg Oral Daily  . calcitRIOL  0.25 mcg Oral Daily  . dexamethasone  40 mg Intravenous Q24H  . famciclovir  250 mg Oral Daily  . furosemide  80 mg Oral BID  . insulin aspart  0-5 Units Subcutaneous QHS  . insulin aspart  0-9 Units Subcutaneous TID WC  . insulin glargine  45 Units Subcutaneous QHS  . metoprolol  25 mg Oral BID  . IMMUNE GLOBULIN 10% (HUMAN) IV - For Fluid Restriction Only  400 mg/kg Intravenous Q24H  . pantoprazole  40 mg Oral Daily  . sodium chloride  3 mL Intravenous Q12H   Continuous Infusions:    Principal Problem:   Other pancytopenia Active Problems:   Diabetes mellitus due to underlying condition with diabetic polyneuropathy   Hypertension associated with diabetes   Multiple myeloma, in relapse   Diabetes mellitus type 2 with complications   Chronic renal insufficiency, stage IV (severe)   Acute on chronic renal failure   Hyperkalemia   Diarrhea    Time spent: 26 minutes    Priya Matsen, MD, FACP, FHM. [facility] Pager [phone]  If 7PM-7AM, please contact night-coverage [email or web address] Password [email or web address] 07/13/2014, 11:36 AM    LOS: 5 days

## 2014-07-13 NOTE — Progress Notes (Signed)
Assumed care of patient at 1500. Patient up in chair, talking with visitors, has no needs at this time. Agree with previous RN's assessment, will continue to monitor and follow up with plan of care.

## 2014-07-14 ENCOUNTER — Inpatient Hospital Stay (HOSPITAL_COMMUNITY): Payer: Medicare Other

## 2014-07-14 LAB — RENAL FUNCTION PANEL
ANION GAP: 17 — AB (ref 5–15)
Albumin: 2.1 g/dL — ABNORMAL LOW (ref 3.5–5.2)
BUN: 91 mg/dL — AB (ref 6–23)
CALCIUM: 7 mg/dL — AB (ref 8.4–10.5)
CO2: 22 meq/L (ref 19–32)
Chloride: 94 mEq/L — ABNORMAL LOW (ref 96–112)
Creatinine, Ser: 5.74 mg/dL — ABNORMAL HIGH (ref 0.50–1.35)
GFR calc Af Amer: 10 mL/min — ABNORMAL LOW (ref >90)
GFR calc non Af Amer: 9 mL/min — ABNORMAL LOW (ref >90)
GLUCOSE: 263 mg/dL — AB (ref 70–99)
PHOSPHORUS: 5.1 mg/dL — AB (ref 2.3–4.6)
POTASSIUM: 4.2 meq/L (ref 3.7–5.3)
SODIUM: 133 meq/L — AB (ref 137–147)

## 2014-07-14 LAB — CBC
HCT: 23.2 % — ABNORMAL LOW (ref 39.0–52.0)
HEMOGLOBIN: 8.2 g/dL — AB (ref 13.0–17.0)
MCH: 30.3 pg (ref 26.0–34.0)
MCHC: 35.3 g/dL (ref 30.0–36.0)
MCV: 85.6 fL (ref 78.0–100.0)
Platelets: 7 10*3/uL — CL (ref 150–400)
RBC: 2.71 MIL/uL — AB (ref 4.22–5.81)
RDW: 14.1 % (ref 11.5–15.5)
WBC: 4.8 10*3/uL (ref 4.0–10.5)

## 2014-07-14 LAB — GLUCOSE, CAPILLARY
GLUCOSE-CAPILLARY: 247 mg/dL — AB (ref 70–99)
Glucose-Capillary: 267 mg/dL — ABNORMAL HIGH (ref 70–99)
Glucose-Capillary: 402 mg/dL — ABNORMAL HIGH (ref 70–99)
Glucose-Capillary: 433 mg/dL — ABNORMAL HIGH (ref 70–99)

## 2014-07-14 MED ORDER — INSULIN ASPART 100 UNIT/ML ~~LOC~~ SOLN
6.0000 [IU] | Freq: Three times a day (TID) | SUBCUTANEOUS | Status: DC
  Administered 2014-07-14 – 2014-07-15 (×4): 6 [IU] via SUBCUTANEOUS

## 2014-07-14 MED ORDER — INSULIN ASPART 100 UNIT/ML ~~LOC~~ SOLN
0.0000 [IU] | Freq: Three times a day (TID) | SUBCUTANEOUS | Status: DC
  Administered 2014-07-14: 15 [IU] via SUBCUTANEOUS

## 2014-07-14 MED ORDER — INSULIN ASPART 100 UNIT/ML ~~LOC~~ SOLN
0.0000 [IU] | SUBCUTANEOUS | Status: DC
  Administered 2014-07-15: 3 [IU] via SUBCUTANEOUS
  Administered 2014-07-15: 11 [IU] via SUBCUTANEOUS
  Administered 2014-07-15: 5 [IU] via SUBCUTANEOUS
  Administered 2014-07-15: 15 [IU] via SUBCUTANEOUS

## 2014-07-14 MED ORDER — INSULIN ASPART 100 UNIT/ML ~~LOC~~ SOLN: 0.0000 [IU] | Freq: Every day | SUBCUTANEOUS | Status: DC

## 2014-07-14 MED ORDER — INSULIN ASPART 100 UNIT/ML ~~LOC~~ SOLN
15.0000 [IU] | Freq: Once | SUBCUTANEOUS | Status: AC
  Administered 2014-07-14: 15 [IU] via SUBCUTANEOUS

## 2014-07-14 NOTE — Progress Notes (Signed)
Physical Therapy Treatment Patient Details Name: William Gaines MRN: [id number] DOB: 10-Dec-1940 Today's Date: July 16, 2014    History of Present Illness 73 year old male with history of kappa light chain myeloma,PET scan 04/21/14 showed stable lytic lesions, DM 2, HTN, CKD 4-5, went to have AV fistula placed on day of admission and was found to have abnormal labs-severe pancytopenia and was referred to ED for admission.  Pt s/p bone marrow biopsy 9/3 and received one unit of PRBC so far today 9/4.    PT Comments    Pt ambulated in hallway with RW and agreeable to use his RW upon d/c for safety.  HR elevated 155-160 bpm upon return to recliner and RN notified.  Follow Up Recommendations  Home health PT     Equipment Recommendations  None recommended by PT    Recommendations for Other Services       Precautions / Restrictions Precautions Precautions: Fall    Mobility  Bed Mobility               General bed mobility comments: pt up in recliner on arrival  Transfers Overall transfer level: Needs assistance Equipment used: Rolling walker (2 wheeled) Transfers: Sit to/from Stand Sit to Stand: Min guard         General transfer comment: verbal cues for safety  Ambulation/Gait Ambulation/Gait assistance: Min guard Ambulation Distance (Feet): 120 Feet Assistive device: Rolling walker (2 wheeled) Gait Pattern/deviations: Step-through pattern;Trunk flexed;Decreased stride length     General Gait Details: verbal cues for safe use of RW, HR 155-160 upon returning to recliner (checked with 2 dynamaps, pt not on telemetry) and SpO2 95% on room air however had pt reapply O2 [place]   Stairs            Wheelchair Mobility    Modified Rankin (Stroke Patients Only)       Balance                                    Cognition Arousal/Alertness: Awake/alert Behavior During Therapy: Flat affect Overall Cognitive Status: Within Functional Limits for tasks  assessed                      Exercises      General Comments        Pertinent Vitals/Pain Pain Assessment: No/denies pain    Home Living                      Prior Function            PT Goals (current goals can now be found in the care plan section) Progress towards PT goals: Progressing toward goals    Frequency  Min 3X/week    PT Plan Current plan remains appropriate    Co-evaluation             End of Session   Activity Tolerance: Patient limited by fatigue Patient left: in chair;with call bell/phone within reach;with chair alarm set     Time: 7588-3254 PT Time Calculation (min): 11 min  Charges:  $Gait Training: 8-22 mins                    G Codes:      Abigal Choung,KATHrine E 07/16/2014, 12:50 PM Carmelia Bake, PT, DPT 07/16/2014 Pager: [phone]

## 2014-07-14 NOTE — Progress Notes (Signed)
He is looking better. He is having problems with her blood sugars. There is some slight blood-tinged sputum. A chest x-ray done 2 days ago showed pulmonary edema versus pneumonia.  He did get a dose of Nplate. He's on IVIG and Decadron. His blood sugars have been on the high side because of the Decadron.  His platelet count is still quite low. It is 7000. His renal function is better. His creatinine is now 5.74. I think the BUN elevation is because of his steroids.  Again, we might get his bone marrow report back tomorrow. I still have a hard time believing that this is myeloma related. It is possible that he may have underlying myelodysplasia. If so, am I sure what we can do to his poorly count better. Nplate has been used a little bit.  He really has to get out of here. I think that he probably has to stay until we finish up his IVIG.  He needs a lot of physical therapy. I am I sure how bad to have one him at home.  I don't think he needs dialysis. His renal function, again,  is improving.  His appetite is improving a little bit.  On physical exam, his blood pressure is 143/81. His temperature is 97.4.  He has good urine output.  His legs saw some edema. However, this looks older better.  Again, hopefully, he will be able to get out of here at least tomorrow.  The bone marrow results will definitely be important. If he has myelodysplasia, then I think the cytogenetics will be very important.  Laurey Arrow

## 2014-07-14 NOTE — Progress Notes (Signed)
Pt's pm CBG 402. MD made aware. VO given to change SSI (novolog) to moderate scale. Orders placed, insulin to be administered to pt. Will educate pt and continue to monitor.

## 2014-07-14 NOTE — Progress Notes (Signed)
[facility] Progress Note   Subjective: 2.5 L UOP yesterday, 950 today so far  Filed Vitals:   07/14/14 1200 07/14/14 1245 07/14/14 1303 07/14/14 1439  BP: 147/82 122/72 133/73 135/66  Pulse: 104 92 100 96  Temp: 97.3 F (36.3 C) 97.6 F (36.4 C) 98.2 F (36.8 C) 98.1 F (36.7 C)  TempSrc:  Oral Oral Oral  Resp:  18 18 18   Height:      Weight:      SpO2: 96% 97% 96% 99%   Exam: Alert, cooperative, no distress, morbidly obese Chest RRR 2/6 murmur at apex Abd obese, +BS, umb hernia 2-3 + LE edema bilat Neuro is alert, nf           Assessment: 1 CKD 5 some improvement , creat down some. Diuresing on po lasix. Bicarb stable 2 Anemia on epo, await BM 3 Pancytopenia getting IVIG for poss ITP, vs malig/MM/leuk conversion 4 DM  5 HTN controlled 6 Obesity 7 Vol excess/ pulm edema  Plan- repeat cxr, cont lasix, check Cr in am    Kelly Splinter MD  pager [phone]    cell [phone]  07/14/2014, 3:49 PM     Recent Labs Lab 07/12/14 0410 07/12/14 2057 07/13/14 0544 07/13/14 2231 07/14/14 0550  NA 133* 129* 135*  --  133*  K 4.5 4.4 4.3  --  4.2  CL 94* 91* 98  --  94*  CO2 21 17* 22  --  22  GLUCOSE 241* 424* 177* 420* 263*  BUN 87* 86* 87*  --  91*  CREATININE 6.35* 5.94* 6.01*  --  5.74*  CALCIUM 7.0* 6.9* 7.2*  --  7.0*  PHOS 4.8*  --  4.9*  --  5.1*    Recent Labs Lab 07/08/14 1315 07/10/14 0425  07/12/14 0410 07/13/14 0544 07/14/14 0550  AST 20 70*  --   --   --   --   ALT 16 33  --   --   --   --   ALKPHOS 63 59  --   --   --   --   BILITOT 0.2* 0.4  --   --   --   --   PROT 6.5 6.1  --   --   --   --   ALBUMIN 2.5* 2.3*  < > 2.3* 2.2* 2.1*  < > = values in this interval not displayed.  Recent Labs Lab 07/08/14 1315  07/10/14 0425  07/12/14 0410 07/13/14 0544 07/14/14 0559  WBC 2.4*  < > 3.2*  < > 3.8* 5.1 4.8  NEUTROABS 1.8  --  2.1  --   --   --   --   HGB 6.1*  < > 7.3*  < > 8.4* 8.4* 8.2*  HCT 17.4*  < > 20.8*  < >  24.1* 24.0* 23.2*  MCV 89.2  < > 87.8  < > 85.8 86.0 85.6  PLT <5*  < > 8*  < > 5* 9* 7*  < > = values in this interval not displayed. Marland Kitchen allopurinol  150 mg Oral Daily  . calcitRIOL  0.25 mcg Oral Daily  . famciclovir  250 mg Oral Daily  . furosemide  80 mg Oral BID  . insulin aspart  0-5 Units Subcutaneous QHS  . insulin aspart  0-9 Units Subcutaneous TID WC  . insulin aspart  6 Units Subcutaneous TID WC  . insulin glargine  50 Units Subcutaneous QHS  . metoprolol  25 mg Oral BID  . IMMUNE GLOBULIN 10% (HUMAN) IV - For Fluid Restriction Only  400 mg/kg Intravenous Q24H  . pantoprazole  40 mg Oral Daily  . sodium chloride  3 mL Intravenous Q12H     acetaminophen, acetaminophen, albuterol, ondansetron (ZOFRAN) IV, ondansetron, oxyCODONE, simethicone, sodium chloride

## 2014-07-14 NOTE — Progress Notes (Signed)
Patient's HS CBG 433. MD on call made aware, ordered one time dose 15 u novolog, and CBG checks with moderate SSI Q4 until patient's CBG is below 200. Will inform patient and continue to monitor.

## 2014-07-14 NOTE — Progress Notes (Signed)
PROGRESS NOTE    William Gaines UYQ:[id number] DOB: Dec 01, 1940 DOA: 07/08/2014 PCP: Cristine Polio, MD  HPI/Brief narrative 73 year old male with history of kappa light chain myeloma, recently 04/15/14 with bone marrow biopsy which showed no evidence of active myeloma and marrow was hypercellular, PET scan 04/21/14 showed stable lytic lesions, not an active treatment for myeloma, DM 2, HTN, CKD 4-5, went to have AV fistula placed on day of admission and was found to have abnormal labs-severe pancytopenia and was referred to ED for admission.   Assessment/Plan:  1. Pancytopenia: Hematology following. 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 He is receiving IVIG and high dose dexamethasone. He received Nplate x1. S/P platelet transfusion x1 early in admission. Mildly blood tinged sputum. As discussed with Dr. Marin Olp on 9/7-prefers to hold off on platelet transfusion unless significant bleeding due to high risk of antibody formation. Patient also declined platelet transfusion yesterday. Anemia secondary to chronic disease, chronic kidney disease and underlying bone marrow disease. Status post PRBC x4 this admission. Hemoglobin stable. Leukopenia has resolved. Bone marrow biopsy performed 9/3-await results-may be back 9/8. Chest x-ray suggests pulmonary edema-pneumonia unlikely in the absence of productive sputum or fever. No PRBC or platelet transfusion today. 2. Acute on Stage 5 CKD; worsened by anemia, lasix, ACEI, NSAID's(received Toradol here) and hemodynamics. Renal ultrasound without hydronephrosis. Nephrology following. Avoid nephrotoxic agents/medications. Creatinine gradually improving and probably approaching baseline. IV fluid discontinued couple days back. Has received PRN IV Lasix with good diuresis. Follows with Dr. Ephriam Knuckles as OP. 3. Hyperkalemia: Likely secondary to worsening renal insufficiency and blood transfusion. Improved to 5.3 after a dose of Kayexalate.  Resolved. 4. Diarrhea: resolved. C. difficile PCR negative. 5. Kappa light chain myeloma: Management per oncology. Skeletal survey-no new lytic lesions. Continues to have previous lytic lesions. 6. Hypertension: Soft blood pressures. Reduced metoprolol. Better. 7. Type II DM with renal complications: had a hypoglycemic episode on 9/1 and his insulins had been reduced. However with initiation of steroids, worsening of CBGs-will adjust insulins. Last day of steroids today and glycemic control should improve after that. 8. History of gout: No acute flare. 9. Metabolic bone disease: Management per nephrology. 10. Bilateral renal masses: Bilateral round hypoechoic masses seen on renal ultrasound-suspected complicated cysts-discussed with nephrology and this can be followed with non-urgent/elective MRI as outpatient.  Code Status: Full Family Communication: Discussed with spouse on 9/4. Disposition Plan: Home when medically stable-? 9/8   Consultants:  Oncology  Nephrology  Procedures:  Bone marrow biopsy 9/3  Antibiotics:  None   Subjective: Cough with minimal pink sputum. Denies dyspnea. Urinating well.  Objective: Filed Vitals:   07/14/14 1200 07/14/14 1245 07/14/14 1303 07/14/14 1439  BP: 147/82 122/72 133/73 135/66  Pulse: 104 92 100 96  Temp: 97.3 F (36.3 C) 97.6 F (36.4 C) 98.2 F (36.8 C) 98.1 F (36.7 C)  TempSrc:  Oral Oral Oral  Resp:  _0 Height:      Weight:      SpO2: 96% 97% 96% 99%    Intake/Output Summary (Last 24 hours) at 07/14/14 1602 Last data filed at 07/14/14 1440  Gross per 24 hour  Intake 822.84 ml  Output   3050 ml  Net -2227.16 ml   Filed Weights   07/12/14 0300 07/13/14 0626 07/14/14 0554  Weight: 118.389 kg (261 lb) 116.9 kg (257 lb 11.5 oz) 116.393 kg (256 lb 9.6 oz)     Exam:  General exam: Pleasant elderly male sitting up in  chair. Respiratory system: Clear. No increased work of breathing. Cardiovascular system: S1 &  S2 heard, RRR. No JVD, murmurs, gallops, clicks. Trace bilateral leg edema-less.  Gastrointestinal system: Abdomen is nondistended, soft and nontender. Normal bowel sounds heard. Central nervous system: Alert and oriented. No focal neurological deficits. Extremities: Symmetric 5 x 5 power.   Data Reviewed: Basic Metabolic Panel:  Recent Labs Lab 07/10/14 0425 07/11/14 0410 07/12/14 0410 07/12/14 2057 07/13/14 0544 07/13/14 2231 07/14/14 0550  NA 137 140 133* 129* 135*  --  133*  K 4.9 3.9 4.5 4.4 4.3  --  4.2  CL 101 100 94* 91* 98  --  94*  CO2 _0 17* 22  --  22  GLUCOSE 97 96 241* 424* 177* 420* 263*  BUN 89* 88* 87* 86* 87*  --  91*  CREATININE 6.91* 6.78* 6.35* 5.94* 6.01*  --  5.74*  CALCIUM 7.3* 7.0* 7.0* 6.9* 7.2*  --  7.0*  PHOS 6.4* 6.1* 4.8*  --  4.9*  --  5.1*   Liver Function Tests:  Recent Labs Lab 07/08/14 1315 07/10/14 0425 07/11/14 0410 07/12/14 0410 07/13/14 0544 07/14/14 0550  AST 20 70*  --   --   --   --   ALT 16 33  --   --   --   --   ALKPHOS 63 59  --   --   --   --   BILITOT 0.2* 0.4  --   --   --   --   PROT 6.5 6.1  --   --   --   --   ALBUMIN 2.5* 2.3* 2.2* 2.3* 2.2* 2.1*   No results found for this basename: LIPASE, AMYLASE,  in the last 168 hours No results found for this basename: AMMONIA,  in the last 168 hours CBC:  Recent Labs Lab 07/08/14 1315  07/10/14 0425 07/11/14 0410 07/11/14 1534 07/12/14 0410 07/13/14 0544 07/14/14 0559  WBC 2.4*  < > 3.2* 2.6*  --  3.8* 5.1 4.8  NEUTROABS 1.8  --  2.1  --   --   --   --   --   HGB 6.1*  < > 7.3* 6.8* 9.0* 8.4* 8.4* 8.2*  HCT 17.4*  < > 20.8* 19.0* 25.9* 24.1* 24.0* 23.2*  MCV 89.2  < > 87.8 88.8  --  85.8 86.0 85.6  PLT <5*  < > 8* 6*  --  5* 9* 7*  < > = values in this interval not displayed. Cardiac Enzymes:  Recent Labs Lab 07/09/14 1155  CKTOTAL 1727*   BNP (last 3 results) No results found for this basename: PROBNP,  in the last 8760 hours CBG:  Recent  Labs Lab 07/13/14 1158 07/13/14 1706 07/13/14 2211 07/14/14 0721 07/14/14 1210  GLUCAP 224* 304* 401* 247* 267*    Recent Results (from the past 240 hour(s))  CLOSTRIDIUM DIFFICILE BY PCR     Status: None   Collection Time    07/09/14 11:06 AM      Result Value Ref Range Status   C difficile by pcr NEGATIVE  NEGATIVE Final   Comment: Performed at [facility]     Status: None   Collection Time    07/10/14 11:04 PM      Result Value Ref Range Status   Specimen Description URINE, RANDOM   Final   Special Requests none   Final   Culture  Setup Time  Final   Value: 07/11/2014 01:20     Performed at [facility]     Final   Value: 4,000 COLONIES/ML     Performed at [facility]   Culture     Final   Value: INSIGNIFICANT GROWTH     Performed at [facility]   Report Status 07/12/2014 FINAL   Final       Studies: No results found.      Scheduled Meds: . allopurinol  150 mg Oral Daily  . calcitRIOL  0.25 mcg Oral Daily  . famciclovir  250 mg Oral Daily  . furosemide  80 mg Oral BID  . insulin aspart  0-5 Units Subcutaneous QHS  . insulin aspart  0-9 Units Subcutaneous TID WC  . insulin aspart  6 Units Subcutaneous TID WC  . insulin glargine  50 Units Subcutaneous QHS  . metoprolol  25 mg Oral BID  . IMMUNE GLOBULIN 10% (HUMAN) IV - For Fluid Restriction Only  400 mg/kg Intravenous Q24H  . pantoprazole  40 mg Oral Daily  . sodium chloride  3 mL Intravenous Q12H   Continuous Infusions:    Principal Problem:   Other pancytopenia Active Problems:   Diabetes mellitus due to underlying condition with diabetic polyneuropathy   Hypertension associated with diabetes   Multiple myeloma, in relapse   Diabetes mellitus type 2 with complications   Chronic renal insufficiency, stage IV (severe)   Acute on chronic renal failure   Hyperkalemia   Diarrhea    Time spent: 56 minutes    Enio Hornback,  MD, FACP, FHM. [facility] Pager [phone]  If 7PM-7AM, please contact night-coverage [email or web address] Password [email or web address] 07/14/2014, 4:02 PM    LOS: 6 days

## 2014-07-15 ENCOUNTER — Other Ambulatory Visit: Payer: Self-pay | Admitting: Hematology & Oncology

## 2014-07-15 DIAGNOSIS — I10 Essential (primary) hypertension: Secondary | ICD-10-CM

## 2014-07-15 DIAGNOSIS — I5043 Acute on chronic combined systolic (congestive) and diastolic (congestive) heart failure: Secondary | ICD-10-CM

## 2014-07-15 DIAGNOSIS — I509 Heart failure, unspecified: Secondary | ICD-10-CM

## 2014-07-15 DIAGNOSIS — E0842 Diabetes mellitus due to underlying condition with diabetic polyneuropathy: Secondary | ICD-10-CM

## 2014-07-15 DIAGNOSIS — E1159 Type 2 diabetes mellitus with other circulatory complications: Secondary | ICD-10-CM

## 2014-07-15 DIAGNOSIS — C9000 Multiple myeloma not having achieved remission: Secondary | ICD-10-CM

## 2014-07-15 LAB — UIFE/LIGHT CHAINS/TP QN, 24-HR UR
ALPHA 2 UR: DETECTED — AB
Albumin, U: DETECTED
Alpha 1, Urine: DETECTED — AB
Beta, Urine: DETECTED — AB
GAMMA UR: DETECTED — AB
TIME-UPE24: 24 h
TOTAL PROTEIN, URINE-UR/DAY: 4390 mg/d — AB (ref <150)
Total Protein, Urine: 439 mg/dL — ABNORMAL HIGH (ref 5–25)
Volume, Urine: 1000 mL

## 2014-07-15 LAB — BASIC METABOLIC PANEL
Anion gap: 18 — ABNORMAL HIGH (ref 5–15)
BUN: 105 mg/dL — ABNORMAL HIGH (ref 6–23)
CALCIUM: 6.9 mg/dL — AB (ref 8.4–10.5)
CO2: 22 mEq/L (ref 19–32)
Chloride: 92 mEq/L — ABNORMAL LOW (ref 96–112)
Creatinine, Ser: 5.55 mg/dL — ABNORMAL HIGH (ref 0.50–1.35)
GFR calc Af Amer: 11 mL/min — ABNORMAL LOW (ref >90)
GFR, EST NON AFRICAN AMERICAN: 9 mL/min — AB (ref >90)
Glucose, Bld: 318 mg/dL — ABNORMAL HIGH (ref 70–99)
Potassium: 3.8 mEq/L (ref 3.7–5.3)
SODIUM: 132 meq/L — AB (ref 137–147)

## 2014-07-15 LAB — CBC
HEMATOCRIT: 22.1 % — AB (ref 39.0–52.0)
Hemoglobin: 7.7 g/dL — ABNORMAL LOW (ref 13.0–17.0)
MCH: 30 pg (ref 26.0–34.0)
MCHC: 34.8 g/dL (ref 30.0–36.0)
MCV: 86 fL (ref 78.0–100.0)
PLATELETS: 7 10*3/uL — AB (ref 150–400)
RBC: 2.57 MIL/uL — ABNORMAL LOW (ref 4.22–5.81)
RDW: 14 % (ref 11.5–15.5)
WBC: 4.9 10*3/uL (ref 4.0–10.5)

## 2014-07-15 LAB — GLUCOSE, CAPILLARY
GLUCOSE-CAPILLARY: 387 mg/dL — AB (ref 70–99)
Glucose-Capillary: 340 mg/dL — ABNORMAL HIGH (ref 70–99)
Glucose-Capillary: 230 mg/dL — ABNORMAL HIGH (ref 70–99)
Glucose-Capillary: 190 mg/dL — ABNORMAL HIGH (ref 70–99)

## 2014-07-15 MED ORDER — METOPROLOL TARTRATE 50 MG PO TABS: 25.0000 mg | ORAL_TABLET | Freq: Two times a day (BID) | ORAL | Status: AC

## 2014-07-15 MED ORDER — INSULIN GLARGINE 100 UNIT/ML ~~LOC~~ SOLN: 50.0000 [IU] | Freq: Every day | SUBCUTANEOUS | Status: AC

## 2014-07-15 MED ORDER — FAMCICLOVIR 500 MG PO TABS: 250.0000 mg | ORAL_TABLET | Freq: Every day | ORAL | Status: DC

## 2014-07-15 MED ORDER — CALCITRIOL 0.25 MCG PO CAPS: 0.2500 ug | ORAL_CAPSULE | Freq: Every day | ORAL | Status: DC

## 2014-07-15 MED ORDER — FUROSEMIDE 40 MG PO TABS: 40.0000 mg | ORAL_TABLET | Freq: Two times a day (BID) | ORAL | Status: AC

## 2014-07-15 NOTE — Progress Notes (Signed)
Discharge instructions reviewed with pt and family. F/u appointments discussed and importance of going to the appointments that have been scheduled for blood work. Medications discussed with patient. Pt encouraged to check his blood sugars ACHS per MD orders.   Othella Boyer [facility] 07/15/2014 4:23 PM

## 2014-07-15 NOTE — Progress Notes (Signed)
He is doing okay this morning. He really wants to go home. He had a chest x-ray done yesterday. Did show resolution of the pulmonary edema.  His renal function case to improve. His creatinine is 5.55. BUN is 105. Again I think the BUN elevation is from his Decadron.  He's had for of 5 doses of the IVIG. He really wants to go home today. I will go ahead and cancel the last dose of the IVIG.  His hemoglobin is 7.7. His platelet count is 7. White cell count is 4.9.  Hopefully the will be some final bone marrow results today.  His blood sugars have been on the high side. Sure his diabetic medications will be adjusted.  His vital signs are all stable. His blood pressure is 139/74. Temperature 97.5. His lungs sound clear bilaterally. I her no wheezes. Cardiac exam regular in rhythm. Abdomen soft. There is no palpable fluid wave. There is no palpable liver or spleen tip. Extremities shows improvement in the edema. He has chronic edema. This is probably 1-2+. Skin exam shows some scattered ecchymoses.  Again, he really, really wants to go home today. He says that he will do much better at home.  I think he probably can go home. I realize that his hemoglobin is down to 7.7. We can probably watch this as an outpatient.  Is hard to say whether not his platelet count will improve. Again he would has not shown any bleeding tendency. He has some blood-tinged sputum. I think this is more a reflection of his low platelet count and some pulmonary edema.  Again, we will follow him up as an outpatient. He really, really wants to go home today.  [place] 91:1-2

## 2014-07-15 NOTE — Discharge Summary (Signed)
Physician Discharge Summary  William Gaines NLG:[id number] DOB: Sep 24, 1941 DOA: 07/08/2014  PCP: Cristine Polio, MD  Admit date: 07/08/2014 Discharge date: 07/15/2014  Time spent: Greater than 30 minutes  Recommendations for Outpatient Follow-up:  1. Dr. Ephriam Knuckles, Nephrologist in 3 days with repeat labs (CBC & Renal Panel). 2. Dr. Cristine Polio, PCP in 3 days with repeat labs (CBC & renal panel). Followup for diabetes management and further evaluation and management of acute on chronic combined CHF. Consider outpatient cardiology consultation. 3. Dr. Burney Gauze, Oncology on 08/04/14 at 4 PM with lab. Followup on pending bone marrow biopsy results. 4. Home health PT & RN. 5. Oxygen via nasal cannula at 2 L per minute with activity. 6. Elective outpatient MRI abdomen to evaluate bilateral renal masses seen on ultrasound-suspected complicated cysts.  Discharge Diagnoses:  Principal Problem:   Other pancytopenia Active Problems:   Diabetes mellitus due to underlying condition with diabetic polyneuropathy   Hypertension associated with diabetes   Multiple myeloma, in relapse   Diabetes mellitus type 2 with complications   Chronic renal insufficiency, stage IV (severe)   Acute on chronic renal failure   Hyperkalemia   Diarrhea   Discharge Condition: Improved & Stable  Diet recommendation: Heart healthy and diabetic diet.  Filed Weights   07/13/14 0626 07/14/14 0554 07/15/14 0608  Weight: 116.9 kg (257 lb 11.5 oz) 116.393 kg (256 lb 9.6 oz) 115.6 kg (254 lb 13.6 oz)    History of present illness:  73 year old male with history of kappa light chain myeloma, recently 04/15/14 with bone marrow biopsy which showed no evidence of active myeloma and marrow was hypercellular, PET scan 04/21/14 showed stable lytic lesions, not an active treatment for myeloma, DM 2, HTN, CKD 4-5, went to have AV fistula placed on day of admission and was found to have abnormal labs-severe pancytopenia  and was referred to ED for admission.   Hospital Course:   1. Pancytopenia: Hematology consulted. 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 He received 4 days of IVIG and high dose dexamethasone. He received Nplate x1. S/P platelet transfusion x1 early on in admission. Mildly blood tinged sputum. As discussed with Dr. Marin Olp on 9/7-prefers to hold off on platelet transfusion unless significant bleeding due to high risk of antibody formation. Patient also declined platelet transfusion couple days back. Anemia secondary to chronic disease, chronic kidney disease and underlying bone marrow disease. Status post PRBC x4 this admission. Hemoglobin slowly trending down and 7.7 today. Hematology recommends close outpatient followup. Leukopenia has resolved. Bone marrow biopsy performed 9/3-results pending. Oncology has cleared patient for discharge home. Advised patient to followup closely with PCP with repeat labs and with oncology as outpatient.  2. Acute on Stage 5 CKD; worsened by anemia, lasix, ACEI, NSAID's(received Toradol here) and hemodynamics. Renal ultrasound without hydronephrosis. Nephrology consulted. Avoid nephrotoxic agents/medications. Creatinine gradually improving and probably approaching baseline. IV fluid discontinued couple days back. Has received PRN IV Lasix with good diuresis. Follows with Dr. Ephriam Knuckles as OP. Discussed with nephrology was cleared him for discharge and recommend Lasix 40 mg by mouth twice a day. 3. Hyperkalemia: Likely secondary to worsening renal insufficiency and blood transfusion. Received a dose of Kayexalate. Resolved.  4. Diarrhea: resolved. C. difficile PCR negative. 5. Kappa light chain myeloma: Management per oncology. Skeletal survey-no new lytic lesions. Continues to have previous lytic lesions.Bone marrow biopsy results pending.  6. Hypertension: Soft blood pressures. Reduced metoprolol. Better. 7. Type II DM with renal complications: had  hypoglycemic  episodes on 9/1 and his insulins had been reduced. However  after steroids were initiated, his glycemic control deteriorated. NovoLog SSI and mealtime were added. Steroids were discontinued after last dose on 9/7. CBGs are already starting to improve. Patient will be discharged on prior Lantus 50 units each bedtime. Advised patient to monitor CBGs closely. Close outpatient followup and this Lantus may have to be further reduced if he has further hypoglycemic episodes. Oral hypoglycemics discontinued. A1c was not performed in the hospital-if recent one not done as outpatient may consider repeating. 8. History of gout: No acute flare.Continue allopurinol.  9. Metabolic bone disease: Management per nephrology. 10. Bilateral renal masses: Bilateral round hypoechoic masses seen on renal ultrasound-suspected complicated cysts-discussed with nephrology and this can be followed with non-urgent/elective MRI as outpatient. 11. Fluid overload/acute on chronic combined CHF: Patient received IV Lasix for a couple of doses and since then he's on by mouth Lasix with good diuresis. He still has substantial leg edema but no dyspnea. Cough with minimal blood-tinged sputum was likely secondary to pulmonary edema and continues to improve. No ACE inhibitors/ARB secondary to renal failure. No aspirin secondary to severe thrombocytopenia and high bleeding risk. Patient on beta blockers. Consider nitrates and hydralazine as outpatient. Consider outpatient cardiology consultation-will not be a candidate for aggressive intervention i.e. cardiac catheterization secondary to worsened renal failure and severe thrombocytopenia. They may consider outpatient stress test which even if abnormal, will have to be managed medically at current time.   Consultations:  Oncology  Nephrology  Interventional radiology for bone marrow biopsy  Procedures:  2-D echo: Study Conclusions  - Left ventricle: The cavity size was  normal. There was moderate concentric hypertrophy. Systolic function was moderately to severely reduced. The estimated ejection fraction was in the range of 30% to 35%. Severe diffuse hypokinesis. Doppler parameters are consistent with abnormal left ventricular relaxation (grade 1 diastolic dysfunction). - Mitral valve: Mildly calcified annulus. There was mild regurgitation. - Left atrium: The atrium was moderately dilated. - Pulmonary arteries: Systolic pressure was mildly increased. PA peak pressure: 41 mm Hg (S).   Bone marrow biopsy 07/10/14   Discharge Exam:  Complaints:  Anxious to go home. Denies dyspnea or chest pain. Continues to urinate well. Leg edema decreasing per patient. Cough with intermittent mild blood-tinged sputum-better than before.  Filed Vitals:   07/14/14 1439 07/14/14 1812 07/14/14 2132 07/15/14 0608  BP: 135/66 156/81 149/78 139/74  Pulse: 96 105 109 92  Temp: 98.1 F (36.7 C) 98 F (36.7 C) 97.4 F (36.3 C) 97.5 F (36.4 C)  TempSrc: Oral Oral Oral Oral  Resp: _0 Height:      Weight:    115.6 kg (254 lb 13.6 oz)  SpO2: 99% 98% 97% 99%   General exam: Pleasant elderly male sitting up in chair.  Respiratory system: Clear. No increased work of breathing.  Cardiovascular system: S1 & S2 heard, RRR. No JVD, murmurs, gallops, clicks. 1+ pitting bilateral leg edema. Gastrointestinal system: Abdomen is nondistended, soft and nontender. Normal bowel sounds heard.  Central nervous system: Alert and oriented. No focal neurological deficits.  Extremities: Symmetric 5 x 5 power.   Discharge Instructions      Discharge Instructions   (HEART FAILURE PATIENTS) Call MD:  Anytime you have any of the following symptoms: 1) 3 pound weight gain in 24 hours or 5 pounds in 1 week 2) shortness of breath, with or without a dry hacking cough 3) swelling in the hands,  feet or stomach 4) if you have to sleep on extra pillows at night in order to breathe.     Complete by:  As directed      Call MD for:  difficulty breathing, headache or visual disturbances    Complete by:  As directed      Call MD for:  extreme fatigue    Complete by:  As directed      Call MD for:  persistant dizziness or light-headedness    Complete by:  As directed      Call MD for:    Complete by:  As directed   Bleeding.     Diet - low sodium heart healthy    Complete by:  As directed      Diet Carb Modified    Complete by:  As directed      Increase activity slowly    Complete by:  As directed             Medication List    STOP taking these medications       acyclovir 400 MG tablet  Commonly known as:  ZOVIRAX     glipiZIDE 5 MG tablet  Commonly known as:  GLUCOTROL     lisinopril 40 MG tablet  Commonly known as:  PRINIVIL,ZESTRIL     potassium chloride SA 20 MEQ tablet  Commonly known as:  K-DUR,KLOR-CON      TAKE these medications       allopurinol 300 MG tablet  Commonly known as:  ZYLOPRIM  Take 150 mg by mouth daily.     calcitRIOL 0.25 MCG capsule  Commonly known as:  ROCALTROL  Take 1 capsule (0.25 mcg total) by mouth daily.     famciclovir 500 MG tablet  Commonly known as:  FAMVIR  Take 0.5 tablets (250 mg total) by mouth daily.     famotidine 20 MG tablet  Commonly known as:  PEPCID  Take 20 mg by mouth 2 (two) times daily.     furosemide 40 MG tablet  Commonly known as:  LASIX  Take 1 tablet (40 mg total) by mouth 2 (two) times daily.     insulin glargine 100 UNIT/ML injection  Commonly known as:  LANTUS  Inject 0.5 mLs (50 Units total) into the skin at bedtime.     metoprolol 50 MG tablet  Commonly known as:  LOPRESSOR  Take 0.5 tablets (25 mg total) by mouth 2 (two) times daily.     ondansetron 8 MG tablet  Commonly known as:  ZOFRAN  Take 8 mg by mouth every 8 (eight) hours as needed for nausea or vomiting (and before chemo).       Follow-up Information   Follow up with [facility] M. Schedule an appointment  as soon as possible for a visit in 3 days. (To be seen with repeat labs (CBC & Renal Panel))    Specialty:  Internal Medicine   Contact information:   [facility] [address], [address] [place] [address] [phone]       Follow up with Cristine Polio, MD. Schedule an appointment as soon as possible for a visit in 3 days. (Follow labs done at Nephrologist's office or repeat (CBC & Renal panel). Diabetes management. Consider outpatient Cardiology consultation.)    Specialty:  Internal Medicine   Contact information:   [address] [place] [address] [phone]       Follow up with Volanda Napoleon, MD. Schedule an appointment  as soon as possible for a visit on 08/04/2014. (at 4 PM with labs.)    Specialty:  Oncology   Contact information:   [address] [place], [address] [place] [address] [phone]        The results of significant diagnostics from this hospitalization (including imaging, microbiology, ancillary and laboratory) are listed below for reference.    Significant Diagnostic Studies: Dg Chest 2 View  07/14/2014   CLINICAL DATA:  Hemoptysis, followup edema.  EXAM: CHEST  2 VIEW  COMPARISON:  07/12/2014.  FINDINGS: Trachea is midline. Heart is at the upper limits of normal in size. Interval clearing of previously seen septal thickening. Tiny residual bilateral pleural effusions.  IMPRESSION: Resolved edema.  Residual tiny bilateral pleural effusions.   Electronically Signed   By: Lorin Picket M.D.   On: 07/14/2014 17:02   US Renal  07/09/2014   CLINICAL DATA:  Acute renal failure/chronic kidney disease.  EXAM: RENAL/URINARY TRACT ULTRASOUND COMPLETE  COMPARISON:  CT 12/30/2011  FINDINGS: Right Kidney:  Length: 11.6 cm. Mild increased cortical echogenicity. No hydronephrosis. Round hypoechoic mass over the mid to upper pole cortex measuring 1.8 cm with mild central increased echogenicity which may be a solid mass versus  complicated cyst. Second round hypoechoic mass with increased through transmission over the upper pole measuring 1.6 cm likely a minimally complicated cyst.  Left Kidney:  Length: 9.6 cm. Mild increased cortical echogenicity. No hydronephrosis. Rounded hypoechoic 1.7 cm mass over the lower pole with increased through transmission likely a minimally complicated cyst.  Bladder:  Appears normal for degree of bladder distention.  IMPRESSION: Normal size kidneys with increased cortical echogenicity compatible with medical renal disease. No hydronephrosis.  Bilateral round hypoechoic masses likely complicated cysts. Recommend MRI if renal function will allow versus followup ultrasound 6 months.   Electronically Signed   By: Marin Olp M.D.   On: 07/09/2014 14:25   Ct Biopsy  07/10/2014   INDICATION: History of multiple myeloma. Please perform CT guided bone marrow biopsy and aspiration.  EXAM: CT-GUIDED BONE MARROW ASPIRATION AND BIOPSY.  MEDICATIONS: None  ANESTHESIA/SEDATION: None  CONTRAST:  None  COMPLICATIONS: None immediate.  PROCEDURE: Informed consent was obtained from the patient following an explanation of the procedure, risks, benefits and alternatives. The patient understands, agrees and consents for the procedure. All questions were addressed. A time out was performed prior to the initiation of the procedure. The patient was positioned prone and non-contrast localization CT was performed of the pelvis to demonstrate the iliac marrow spaces. The operative site was prepped and draped in the usual sterile fashion.  Under sterile conditions and local anesthesia, a 22 gauge spinal needle was utilized for procedural planning. Next, an 11 gauge coaxial bone biopsy needle was advanced into the left iliac marrow space. Needle position was confirmed with CT imaging. Initially, bone marrow aspiration was performed. Next, a bone marrow biopsy was obtained with the 11 gauge outer bone marrow device. Samples were  prepared with the cytotechnologist and deemed adequate. The needle was removed intact. Hemostasis was obtained with compression and a dressing was placed. The patient tolerated the procedure well without immediate post procedural complication.  IMPRESSION: Successful CT guided left iliac bone marrow aspiration and core biopsies.   Electronically Signed   By: Sandi Mariscal M.D.   On: 07/10/2014 13:04   Dg Chest Port 1 View  07/12/2014   CLINICAL DATA:  Hemoptysis  EXAM: PORTABLE CHEST - 1 VIEW  COMPARISON:  830 hr  FINDINGS: Heterogeneous bar predominately interstitial opacities are present throughout both lungs worrisome for diffuse pneumonia or edema. Mild cardiomegaly. No pneumothorax.  IMPRESSION: Findings most consistent with diffuse edema or pneumonia.   Electronically Signed   By: Maryclare Bean M.D.   On: 07/12/2014 14:33   Dg Bone Survey Met  07/12/2014   CLINICAL DATA:  Multiple myeloma  EXAM: METASTATIC BONE SURVEY  COMPARISON:  PET-CT dated 04/21/2014.  Bone survey dated 11/29/2013.  FINDINGS: 2.4 cm lytic lesion in the calvarium, grossly unchanged.  Degenerative changes of the cervical, thoracic, and lumbar spine. Stable postsurgical changes involving the lumbar spine.  Expansile mixed sclerotic and lytic lesion involving the left iliac crest. 1.7 cm lytic lesion in the left inferior pubic ramus, unchanged.  Visualized bilateral upper and lower extremities are within normal limits.  Lungs are grossly clear.  Heart is normal in size.  IMPRESSION: Stable lytic lesions in the calvarium and left inferior pubic ramus.  Stable mixed sclerotic and lytic lesion in the left iliac crest.  No evidence of new lesions in the visualized axial and appendicular skeleton.   Electronically Signed   By: Julian Hy M.D.   On: 07/12/2014 09:45    Microbiology: Recent Results (from the past 240 hour(s))  CLOSTRIDIUM DIFFICILE BY PCR     Status: None   Collection Time    07/09/14 11:06 AM      Result Value Ref  Range Status   C difficile by pcr NEGATIVE  NEGATIVE Final   Comment: Performed at [facility] [place]     Status: None   Collection Time    07/10/14 11:04 PM      Result Value Ref Range Status   Specimen Description URINE, RANDOM   Final   Special Requests none   Final   Culture  Setup Time     Final   Value: 07/11/2014 01:20     Performed at [facility]     Final   Value: 4,000 COLONIES/ML     Performed at [facility]   Culture     Final   Value: INSIGNIFICANT GROWTH     Performed at [facility]   Report Status 07/12/2014 FINAL   Final     Labs: Basic Metabolic Panel:  Recent Labs Lab 07/10/14 0425 07/11/14 0410 07/12/14 0410 07/12/14 2057 07/13/14 0544 07/13/14 2231 07/14/14 0550 07/15/14 0436  NA 137 140 133* 129* 135*  --  133* 132*  K 4.9 3.9 4.5 4.4 4.3  --  4.2 3.8  CL 101 100 94* 91* 98  --  94* 92*  CO2 _0 17* 22  --  22 22  GLUCOSE 97 96 241* 424* 177* 420* 263* 318*  BUN 89* 88* 87* 86* 87*  --  91* 105*  CREATININE 6.91* 6.78* 6.35* 5.94* 6.01*  --  5.74* 5.55*  CALCIUM 7.3* 7.0* 7.0* 6.9* 7.2*  --  7.0* 6.9*  PHOS 6.4* 6.1* 4.8*  --  4.9*  --  5.1*  --    Liver Function Tests:  Recent Labs Lab 07/10/14 0425 07/11/14 0410 07/12/14 0410 07/13/14 0544 07/14/14 0550  AST 70*  --   --   --   --   ALT 33  --   --   --   --   ALKPHOS 59  --   --   --   --   BILITOT 0.4  --   --   --   --  PROT 6.1  --   --   --   --   ALBUMIN 2.3* 2.2* 2.3* 2.2* 2.1*   No results found for this basename: LIPASE, AMYLASE,  in the last 168 hours No results found for this basename: AMMONIA,  in the last 168 hours CBC:  Recent Labs Lab 07/10/14 0425 07/11/14 0410 07/11/14 1534 07/12/14 0410 07/13/14 0544 07/14/14 0559 07/15/14 0436  WBC 3.2* 2.6*  --  3.8* 5.1 4.8 4.9  NEUTROABS 2.1  --   --   --   --   --   --   HGB 7.3* 6.8* 9.0* 8.4* 8.4* 8.2* 7.7*  HCT 20.8* 19.0* 25.9* 24.1* 24.0* 23.2*  22.1*  MCV 87.8 88.8  --  85.8 86.0 85.6 86.0  PLT 8* 6*  --  5* 9* 7* 7*   Cardiac Enzymes:  Recent Labs Lab 07/09/14 1155  CKTOTAL 1727*   BNP: BNP (last 3 results) No results found for this basename: PROBNP,  in the last 8760 hours CBG:  Recent Labs Lab 07/14/14 2143 07/15/14 0103 07/15/14 0423 07/15/14 0725 07/15/14 1136  GLUCAP 433* 387* 340* 230* 190*    Additional labs: 1. Serum uric acid: 7.4 2. LDH: 528 3. Iron studies: Iron 58, TIBC 208, saturation ratios 28, ferritin 360. Folate 16.4. Vitamin B12: 958 4. SPEP: M spike not detected. IgG 1390, IgA 479, IgM 26. Alpha I globulin: 6.2. Alpha-2 globulin: 11.8 5. Kappa free light chain: 23.5, lambda free light chain: 11.1 and Kappa: Lambda ratio: 2.12 6. D-dimer 0.88, fibrinogen 506 and INR 1.34 7. PTH: 319 8. HBsAg: Negative. HCV AB: Negative 9. UPEP: No monoclonal free light chains detected. Urine IFE shows polyclonal increase in free kappa and or free lambda light chains. 10. GI pathogen panel PCR: Negative   Signed:  Vernell Leep, MD, FACP, FHM. [facility] Pager [phone]  If 7PM-7AM, please contact night-coverage [email or web address] Password [email or web address] 07/15/2014, 1:49 PM

## 2014-07-15 NOTE — Progress Notes (Signed)
SATURATION QUALIFICATIONS: (This note is used to comply with regulatory documentation for home oxygen)  Patient Saturations on Room Air at Rest =93  Patient Saturations on Room Air while Ambulating =88  Patient Saturations on 2Liters of oxygen while Ambulating = 92  Please briefly explain why patient needs home oxygen: 

## 2014-07-15 NOTE — Progress Notes (Signed)
[facility]  [facility] is providing the following services: Oxygen  If patient discharges after hours, please call [phone].   William Gaines 07/15/2014, 4:25 PM

## 2014-07-17 ENCOUNTER — Telehealth: Payer: Self-pay | Admitting: Hematology & Oncology

## 2014-07-17 ENCOUNTER — Other Ambulatory Visit: Payer: Self-pay | Admitting: Hematology & Oncology

## 2014-07-17 NOTE — Telephone Encounter (Signed)
I had a long telephone call with William Gaines and his wife.  He was discharged Tuesday from the hospital. He had been admitted there because of pancytopenia. We did do a bone marrow test on him. 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 The pathologist said of these findings were not present on his bone marrow 3 months ago. He does not have myeloma. His only 4% plasma cells. There is no monoclonality with his light chains.  This is a very disturbing finding. I did not have the side effects back. These are being sent.  Unfortunately, I think there is very little at we can do about this. He has a poor performance status (ECOG 3). I still think that systemic therapy is going to be all that effective. I am not sure as to how he would tolerate this anyway.  I spoke to his family doctor. Dr. Sheppard Coil, in [place], understands what is going on. I faxed him over the bone marrow report. He did blood work on [name]. He hasn't these results come back tomorrow. I told Dr. Sheppard Coil that [name] probably will need to be transfused. I told him that he does not need any specific blood products. The blood has not had to be radiated. He does not have to be CMV negative. It has not had be HLA matched.  With [name], I will get him in next week. I am probably going to have to consider talking to him about end-of-life issues. I think that since this was not noted 3 months ago, that this is a fairly aggressive process.  Both [name] and his wife do understand what is going on. They understand the serious nature of his bone marrow issue. They understand that he likely will need to be transfused to try to help with his blood counts.  I think the cytogenetics will be incredibly important for [place].  I tried to answer all their questions.  William Gaines

## 2014-07-17 NOTE — Telephone Encounter (Signed)
Left pt message with 9-17 appointment

## 2014-07-18 ENCOUNTER — Inpatient Hospital Stay (HOSPITAL_COMMUNITY)
Admission: EM | Admit: 2014-07-18 | Discharge: 2014-07-20 | DRG: 809 | Disposition: A | Discharge status: Home / Self Care | Payer: Medicare Other | Attending: Internal Medicine | Admitting: Internal Medicine

## 2014-07-18 ENCOUNTER — Encounter (HOSPITAL_COMMUNITY): Payer: Self-pay | Admitting: Emergency Medicine

## 2014-07-18 ENCOUNTER — Emergency Department (HOSPITAL_COMMUNITY): Payer: Medicare Other

## 2014-07-18 DIAGNOSIS — D649 Anemia, unspecified: Secondary | ICD-10-CM | POA: Diagnosis present

## 2014-07-18 DIAGNOSIS — I429 Cardiomyopathy, unspecified: Secondary | ICD-10-CM | POA: Diagnosis present

## 2014-07-18 DIAGNOSIS — D696 Thrombocytopenia, unspecified: Secondary | ICD-10-CM | POA: Diagnosis present

## 2014-07-18 DIAGNOSIS — E1142 Type 2 diabetes mellitus with diabetic polyneuropathy: Secondary | ICD-10-CM | POA: Diagnosis present

## 2014-07-18 DIAGNOSIS — E119 Type 2 diabetes mellitus without complications: Secondary | ICD-10-CM

## 2014-07-18 DIAGNOSIS — T451X5A Adverse effect of antineoplastic and immunosuppressive drugs, initial encounter: Secondary | ICD-10-CM | POA: Diagnosis present

## 2014-07-18 DIAGNOSIS — M109 Gout, unspecified: Secondary | ICD-10-CM | POA: Diagnosis present

## 2014-07-18 DIAGNOSIS — R042 Hemoptysis: Secondary | ICD-10-CM | POA: Diagnosis present

## 2014-07-18 DIAGNOSIS — N049 Nephrotic syndrome with unspecified morphologic changes: Secondary | ICD-10-CM | POA: Diagnosis present

## 2014-07-18 DIAGNOSIS — E0842 Diabetes mellitus due to underlying condition with diabetic polyneuropathy: Secondary | ICD-10-CM | POA: Diagnosis present

## 2014-07-18 DIAGNOSIS — C9002 Multiple myeloma in relapse: Secondary | ICD-10-CM

## 2014-07-18 DIAGNOSIS — R509 Fever, unspecified: Secondary | ICD-10-CM

## 2014-07-18 DIAGNOSIS — I152 Hypertension secondary to endocrine disorders: Secondary | ICD-10-CM | POA: Diagnosis present

## 2014-07-18 DIAGNOSIS — Z79899 Other long term (current) drug therapy: Secondary | ICD-10-CM

## 2014-07-18 DIAGNOSIS — E1149 Type 2 diabetes mellitus with other diabetic neurological complication: Secondary | ICD-10-CM | POA: Diagnosis present

## 2014-07-18 DIAGNOSIS — N179 Acute kidney failure, unspecified: Secondary | ICD-10-CM

## 2014-07-18 DIAGNOSIS — D469 Myelodysplastic syndrome, unspecified: Secondary | ICD-10-CM | POA: Diagnosis present

## 2014-07-18 DIAGNOSIS — D6489 Other specified anemias: Secondary | ICD-10-CM

## 2014-07-18 DIAGNOSIS — I428 Other cardiomyopathies: Secondary | ICD-10-CM | POA: Diagnosis present

## 2014-07-18 DIAGNOSIS — K921 Melena: Secondary | ICD-10-CM | POA: Diagnosis present

## 2014-07-18 DIAGNOSIS — N058 Unspecified nephritic syndrome with other morphologic changes: Secondary | ICD-10-CM | POA: Diagnosis present

## 2014-07-18 DIAGNOSIS — D61818 Other pancytopenia: Secondary | ICD-10-CM | POA: Diagnosis present

## 2014-07-18 DIAGNOSIS — E875 Hyperkalemia: Secondary | ICD-10-CM

## 2014-07-18 DIAGNOSIS — C9000 Multiple myeloma not having achieved remission: Secondary | ICD-10-CM | POA: Diagnosis present

## 2014-07-18 DIAGNOSIS — Z87891 Personal history of nicotine dependence: Secondary | ICD-10-CM

## 2014-07-18 DIAGNOSIS — E1159 Type 2 diabetes mellitus with other circulatory complications: Secondary | ICD-10-CM

## 2014-07-18 DIAGNOSIS — N185 Chronic kidney disease, stage 5: Secondary | ICD-10-CM | POA: Diagnosis present

## 2014-07-18 DIAGNOSIS — D61810 Antineoplastic chemotherapy induced pancytopenia: Principal | ICD-10-CM | POA: Diagnosis present

## 2014-07-18 DIAGNOSIS — Z794 Long term (current) use of insulin: Secondary | ICD-10-CM | POA: Diagnosis not present

## 2014-07-18 DIAGNOSIS — I120 Hypertensive chronic kidney disease with stage 5 chronic kidney disease or end stage renal disease: Secondary | ICD-10-CM | POA: Diagnosis present

## 2014-07-18 DIAGNOSIS — E1129 Type 2 diabetes mellitus with other diabetic kidney complication: Secondary | ICD-10-CM | POA: Diagnosis present

## 2014-07-18 DIAGNOSIS — D630 Anemia in neoplastic disease: Secondary | ICD-10-CM

## 2014-07-18 DIAGNOSIS — E876 Hypokalemia: Secondary | ICD-10-CM | POA: Diagnosis present

## 2014-07-18 DIAGNOSIS — N08 Glomerular disorders in diseases classified elsewhere: Secondary | ICD-10-CM

## 2014-07-18 DIAGNOSIS — R197 Diarrhea, unspecified: Secondary | ICD-10-CM

## 2014-07-18 DIAGNOSIS — I10 Essential (primary) hypertension: Secondary | ICD-10-CM

## 2014-07-18 DIAGNOSIS — N184 Chronic kidney disease, stage 4 (severe): Secondary | ICD-10-CM | POA: Diagnosis present

## 2014-07-18 DIAGNOSIS — E118 Type 2 diabetes mellitus with unspecified complications: Secondary | ICD-10-CM | POA: Diagnosis present

## 2014-07-18 DIAGNOSIS — N189 Chronic kidney disease, unspecified: Secondary | ICD-10-CM

## 2014-07-18 LAB — URINALYSIS, ROUTINE W REFLEX MICROSCOPIC
Bilirubin Urine: NEGATIVE
Glucose, UA: NEGATIVE mg/dL
Ketones, ur: NEGATIVE mg/dL
Leukocytes, UA: NEGATIVE
NITRITE: NEGATIVE
PH: 5.5 (ref 5.0–8.0)
Protein, ur: 100 mg/dL — AB
SPECIFIC GRAVITY, URINE: 1.013 (ref 1.005–1.030)
UROBILINOGEN UA: 0.2 mg/dL (ref 0.0–1.0)

## 2014-07-18 LAB — CBC WITH DIFFERENTIAL/PLATELET
BASOS PCT: 1 % (ref 0–1)
Basophils Absolute: 0 10*3/uL (ref 0.0–0.1)
Eosinophils Absolute: 0 10*3/uL (ref 0.0–0.7)
Eosinophils Relative: 1 % (ref 0–5)
HCT: 21.2 % — ABNORMAL LOW (ref 39.0–52.0)
HEMOGLOBIN: 7.3 g/dL — AB (ref 13.0–17.0)
Lymphocytes Relative: 8 % — ABNORMAL LOW (ref 12–46)
Lymphs Abs: 0.3 10*3/uL — ABNORMAL LOW (ref 0.7–4.0)
MCH: 29.4 pg (ref 26.0–34.0)
MCHC: 34.4 g/dL (ref 30.0–36.0)
MCV: 85.5 fL (ref 78.0–100.0)
MONO ABS: 1 10*3/uL (ref 0.1–1.0)
Monocytes Relative: 27 % — ABNORMAL HIGH (ref 3–12)
NEUTROS PCT: 63 % (ref 43–77)
Neutro Abs: 2.3 10*3/uL (ref 1.7–7.7)
Platelets: <5 10*3/uL — CL (ref 150–400)
RBC: 2.48 MIL/uL — ABNORMAL LOW (ref 4.22–5.81)
RDW: 13.8 % (ref 11.5–15.5)
WBC: 3.6 10*3/uL — ABNORMAL LOW (ref 4.0–10.5)

## 2014-07-18 LAB — COMPREHENSIVE METABOLIC PANEL
ALT: 28 U/L (ref 0–53)
ANION GAP: 18 — AB (ref 5–15)
AST: 21 U/L (ref 0–37)
Albumin: 2 g/dL — ABNORMAL LOW (ref 3.5–5.2)
Alkaline Phosphatase: 64 U/L (ref 39–117)
BUN: 133 mg/dL — ABNORMAL HIGH (ref 6–23)
CO2: 22 mEq/L (ref 19–32)
Calcium: 6.5 mg/dL — ABNORMAL LOW (ref 8.4–10.5)
Chloride: 98 mEq/L (ref 96–112)
Creatinine, Ser: 6.3 mg/dL — ABNORMAL HIGH (ref 0.50–1.35)
GFR calc non Af Amer: 8 mL/min — ABNORMAL LOW (ref >90)
GFR, EST AFRICAN AMERICAN: 9 mL/min — AB (ref >90)
GLUCOSE: 171 mg/dL — AB (ref 70–99)
POTASSIUM: 3.3 meq/L — AB (ref 3.7–5.3)
SODIUM: 138 meq/L (ref 137–147)
TOTAL PROTEIN: 6.5 g/dL (ref 6.0–8.3)
Total Bilirubin: 0.5 mg/dL (ref 0.3–1.2)

## 2014-07-18 LAB — PREPARE RBC (CROSSMATCH)

## 2014-07-18 LAB — MAGNESIUM: Magnesium: 1.5 mg/dL (ref 1.5–2.5)

## 2014-07-18 LAB — HEMOGLOBIN A1C
Hgb A1c MFr Bld: 7.3 % — ABNORMAL HIGH (ref <5.7)
MEAN PLASMA GLUCOSE: 163 mg/dL — AB (ref <117)

## 2014-07-18 LAB — CBC
HCT: 24.2 % — ABNORMAL LOW (ref 39.0–52.0)
Hemoglobin: 8.6 g/dL — ABNORMAL LOW (ref 13.0–17.0)
MCH: 30 pg (ref 26.0–34.0)
MCHC: 35.5 g/dL (ref 30.0–36.0)
MCV: 84.3 fL (ref 78.0–100.0)
Platelets: 5 10*3/uL — CL (ref 150–400)
RBC: 2.87 MIL/uL — ABNORMAL LOW (ref 4.22–5.81)
RDW: 13.9 % (ref 11.5–15.5)
WBC: 3.3 10*3/uL — ABNORMAL LOW (ref 4.0–10.5)

## 2014-07-18 LAB — MRSA PCR SCREENING: MRSA BY PCR: NEGATIVE

## 2014-07-18 LAB — PROTIME-INR
INR: 1.33 (ref 0.00–1.49)
Prothrombin Time: 16.5 seconds — ABNORMAL HIGH (ref 11.6–15.2)

## 2014-07-18 LAB — GLUCOSE, CAPILLARY
GLUCOSE-CAPILLARY: 162 mg/dL — AB (ref 70–99)
Glucose-Capillary: 207 mg/dL — ABNORMAL HIGH (ref 70–99)

## 2014-07-18 LAB — URINE MICROSCOPIC-ADD ON

## 2014-07-18 MED ORDER — POTASSIUM CHLORIDE CRYS ER 20 MEQ PO TBCR
20.0000 meq | EXTENDED_RELEASE_TABLET | Freq: Once | ORAL | Status: AC
  Administered 2014-07-18: 20 meq via ORAL
  Filled 2014-07-18: qty 1

## 2014-07-18 MED ORDER — MORPHINE SULFATE 2 MG/ML IJ SOLN: 1.0000 mg | INTRAMUSCULAR | Status: DC | PRN

## 2014-07-18 MED ORDER — CALCITRIOL 0.25 MCG PO CAPS
0.2500 ug | ORAL_CAPSULE | Freq: Every day | ORAL | Status: DC
  Administered 2014-07-19 – 2014-07-20 (×2): 0.25 ug via ORAL
  Filled 2014-07-18 (×2): qty 1

## 2014-07-18 MED ORDER — ONDANSETRON HCL 4 MG PO TABS: 4.0000 mg | ORAL_TABLET | Freq: Four times a day (QID) | ORAL | Status: DC | PRN

## 2014-07-18 MED ORDER — ALBUTEROL SULFATE (2.5 MG/3ML) 0.083% IN NEBU: 2.5000 mg | INHALATION_SOLUTION | RESPIRATORY_TRACT | Status: DC | PRN

## 2014-07-18 MED ORDER — INSULIN GLARGINE 100 UNIT/ML ~~LOC~~ SOLN
50.0000 [IU] | Freq: Every day | SUBCUTANEOUS | Status: DC
  Administered 2014-07-18 – 2014-07-19 (×2): 50 [IU] via SUBCUTANEOUS
  Filled 2014-07-18 (×2): qty 0.5

## 2014-07-18 MED ORDER — SODIUM CHLORIDE 0.9 % IV SOLN
Freq: Once | INTRAVENOUS | Status: AC
  Administered 2014-07-18: 14:00:00 via INTRAVENOUS

## 2014-07-18 MED ORDER — SODIUM CHLORIDE 0.9 % IJ SOLN
3.0000 mL | Freq: Two times a day (BID) | INTRAMUSCULAR | Status: DC
  Administered 2014-07-18 – 2014-07-19 (×2): 3 mL via INTRAVENOUS

## 2014-07-18 MED ORDER — METOPROLOL TARTRATE 25 MG PO TABS
25.0000 mg | ORAL_TABLET | Freq: Two times a day (BID) | ORAL | Status: DC
  Administered 2014-07-18 – 2014-07-20 (×4): 25 mg via ORAL
  Filled 2014-07-18 (×4): qty 1

## 2014-07-18 MED ORDER — SODIUM CHLORIDE 0.9 % IV SOLN: Freq: Once | INTRAVENOUS | Status: DC

## 2014-07-18 MED ORDER — FUROSEMIDE 40 MG PO TABS
40.0000 mg | ORAL_TABLET | Freq: Two times a day (BID) | ORAL | Status: DC
Start: 2014-07-18 — End: 2014-07-20
  Administered 2014-07-18 – 2014-07-20 (×4): 40 mg via ORAL
  Filled 2014-07-18 (×4): qty 1

## 2014-07-18 MED ORDER — SODIUM CHLORIDE 0.9 % IV SOLN
250.0000 mL | INTRAVENOUS | Status: DC | PRN
  Administered 2014-07-19: 250 mL via INTRAVENOUS

## 2014-07-18 MED ORDER — ONDANSETRON HCL 4 MG/2ML IJ SOLN: 4.0000 mg | Freq: Four times a day (QID) | INTRAMUSCULAR | Status: DC | PRN

## 2014-07-18 MED ORDER — ACETAMINOPHEN 650 MG RE SUPP
650.0000 mg | Freq: Four times a day (QID) | RECTAL | Status: DC | PRN
Start: 2014-07-18 — End: 2014-07-20

## 2014-07-18 MED ORDER — SODIUM CHLORIDE 0.9 % IJ SOLN: 3.0000 mL | INTRAMUSCULAR | Status: DC | PRN

## 2014-07-18 MED ORDER — BISACODYL 10 MG RE SUPP
10.0000 mg | Freq: Every day | RECTAL | Status: DC | PRN
Start: 2014-07-18 — End: 2014-07-20

## 2014-07-18 MED ORDER — SODIUM CHLORIDE 0.9 % IJ SOLN
3.0000 mL | Freq: Two times a day (BID) | INTRAMUSCULAR | Status: DC
  Administered 2014-07-18 – 2014-07-19 (×3): 3 mL via INTRAVENOUS

## 2014-07-18 MED ORDER — VALACYCLOVIR HCL 500 MG PO TABS: 1000.0000 mg | ORAL_TABLET | Freq: Every day | ORAL | Status: DC

## 2014-07-18 MED ORDER — MAGNESIUM CITRATE PO SOLN: 1.0000 | Freq: Once | ORAL | Status: AC | PRN

## 2014-07-18 MED ORDER — FUROSEMIDE 10 MG/ML IJ SOLN
20.0000 mg | Freq: Once | INTRAMUSCULAR | Status: AC
  Administered 2014-07-18: 20 mg via INTRAVENOUS
  Filled 2014-07-18: qty 4

## 2014-07-18 MED ORDER — ALLOPURINOL 100 MG PO TABS
150.0000 mg | ORAL_TABLET | Freq: Every day | ORAL | Status: DC
  Administered 2014-07-19 – 2014-07-20 (×2): 150 mg via ORAL
  Filled 2014-07-18 (×2): qty 2

## 2014-07-18 MED ORDER — PANTOPRAZOLE SODIUM 40 MG IV SOLR
40.0000 mg | Freq: Two times a day (BID) | INTRAVENOUS | Status: DC
  Administered 2014-07-18: 40 mg via INTRAVENOUS
  Filled 2014-07-18: qty 40

## 2014-07-18 MED ORDER — ACETAMINOPHEN 325 MG PO TABS: 650.0000 mg | ORAL_TABLET | Freq: Four times a day (QID) | ORAL | Status: DC | PRN

## 2014-07-18 MED ORDER — POLYETHYLENE GLYCOL 3350 17 G PO PACK: 17.0000 g | PACK | Freq: Every day | ORAL | Status: DC | PRN

## 2014-07-18 MED ORDER — INSULIN ASPART 100 UNIT/ML ~~LOC~~ SOLN
0.0000 [IU] | Freq: Three times a day (TID) | SUBCUTANEOUS | Status: DC
  Administered 2014-07-18 – 2014-07-19 (×4): 2 [IU] via SUBCUTANEOUS

## 2014-07-18 NOTE — ED Provider Notes (Signed)
CSN: [id number]     Arrival date & time 07/18/14  4270 History   First MD Initiated Contact with Patient 07/18/14 [phone]     Chief Complaint  Patient presents with  . Fatigue     (Consider location/radiation/quality/duration/timing/severity/associated sxs/prior Treatment) HPI Comments: 73 y.o. male w hx of DM, HTN, and recent diagnosis of leukemia who was sent here by his PCP due to worsening anemia and thrombocytopenia. The patient was recently discharged 3 days ago and has had hemoptysis and dark stools since that time. He was noted to be thrombocytopenic and anemic on his recent admission. He is currently stable on 2 L nasal cannula which he was discharged home on. He denies any chest pain, no shortness of breath, fever, or other pain. Nothing has relieved his symptoms so far. He has had similar symptoms previously.   The history is provided by the patient.    Past Medical History  Diagnosis Date  . Diabetes mellitus   . Hypertension   . Nephrotic syndrome in diabetes mellitus   . Fever, unspecified 10/04/2011  . Chronic renal insufficiency, stage III (moderate)     diabetes, Hypertension, nephrotic syndrome  . Gout   . Cellulitis and abscess of leg     diabetic  . Hernia of abdominal wall     ombelicus  . Gout 10/04/2011  . Fracture, vertebra, pathologic 12/23/2011  . Diabetic peripheral neuropathy 04/20/2012  . Multiple myeloma, in relapse   . Multiple myeloma, in relapse 10/04/2011   Past Surgical History  Procedure Laterality Date  . Back surgery  2007 approx.    due to multiple myeloma   Family History  Problem Relation Age of Onset  . Hypertension Mother   . Diabetes Mother   . Cancer Brother   . Diabetes Son    History  Substance Use Topics  . Smoking status: Former Smoker -- 1.00 packs/day for 42 years    Types: Cigarettes    Start date: 08/21/1954    Quit date: 10/03/1996  . Smokeless tobacco: Current User    Types: Chew     Comment: quit smoking 17years  ago  . Alcohol Use: No    Review of Systems  Constitutional: Negative for fever.  HENT: Negative for drooling and rhinorrhea.   Eyes: Negative for pain.  Respiratory: Negative for cough and shortness of breath.        Hemoptysis   Cardiovascular: Negative for chest pain and leg swelling.  Gastrointestinal: Negative for nausea, vomiting, abdominal pain and diarrhea.  Genitourinary: Negative for dysuria and hematuria.       Dark stools  Musculoskeletal: Negative for gait problem and neck pain.  Skin: Negative for color change.  Neurological: Negative for numbness and headaches.  Hematological: Negative for adenopathy.  Psychiatric/Behavioral: Negative for behavioral problems.  All other systems reviewed and are negative.     Allergies  Codeine and Lorazepam  Home Medications   Prior to Admission medications   Medication Sig Start Date End Date Taking? Authorizing Provider  allopurinol (ZYLOPRIM) 300 MG tablet Take 150 mg by mouth daily.    Yes Historical Provider, MD  calcitRIOL (ROCALTROL) 0.25 MCG capsule Take 1 capsule (0.25 mcg total) by mouth daily. 07/15/14  Yes Modena Jansky, MD  famciclovir (FAMVIR) 500 MG tablet Take 0.5 tablets (250 mg total) by mouth daily. 07/15/14  Yes Modena Jansky, MD  famotidine (PEPCID) 20 MG tablet Take 20 mg by mouth 2 (two) times daily.   Yes Historical  Provider, MD  furosemide (LASIX) 40 MG tablet Take 1 tablet (40 mg total) by mouth 2 (two) times daily. 07/15/14  Yes Modena Jansky, MD  insulin glargine (LANTUS) 100 UNIT/ML injection Inject 0.5 mLs (50 Units total) into the skin at bedtime. 07/15/14  Yes Modena Jansky, MD  metoprolol (LOPRESSOR) 50 MG tablet Take 0.5 tablets (25 mg total) by mouth 2 (two) times daily. 07/15/14  Yes Modena Jansky, MD  ondansetron (ZOFRAN) 8 MG tablet Take 8 mg by mouth every 8 (eight) hours as needed for nausea or vomiting (and before chemo).   Yes Historical Provider, MD   BP 140/73  Pulse 89   Temp(Src) 98 F (36.7 C) (Oral)  Resp 20  SpO2 95% Physical Exam  Nursing note and vitals reviewed. Constitutional: He is oriented to person, place, and time. He appears well-developed and well-nourished.  HENT:  Head: Normocephalic and atraumatic.  Right Ear: External ear normal.  Left Ear: External ear normal.  Nose: Nose normal.  Mouth/Throat: Oropharynx is clear and moist. No oropharyngeal exudate.  Eyes: Conjunctivae and EOM are normal. Pupils are equal, round, and reactive to light.  Neck: Normal range of motion. Neck supple.  Cardiovascular: Normal rate, regular rhythm, normal heart sounds and intact distal pulses.  Exam reveals no gallop and no friction rub.   No murmur heard. Pulmonary/Chest: Effort normal and breath sounds normal. No respiratory distress. He has no wheezes.  Abdominal: Soft. Bowel sounds are normal. He exhibits no distension. There is no tenderness. There is no rebound and no guarding.  Musculoskeletal: Normal range of motion. He exhibits edema. He exhibits no tenderness.  Moderate pitting edema of bilateral lower extremities.  Neurological: He is alert and oriented to person, place, and time.  Skin: Skin is warm and dry.  Psychiatric: He has a normal mood and affect. His behavior is normal.    ED Course  Procedures (including critical care time) Labs Review Labs Reviewed  CBC WITH DIFFERENTIAL - Abnormal; Notable for the following:    WBC 3.6 (*)    RBC 2.48 (*)    Hemoglobin 7.3 (*)    HCT 21.2 (*)    Platelets <5 (*)    Lymphocytes Relative 8 (*)    Monocytes Relative 27 (*)    Lymphs Abs 0.3 (*)    All other components within normal limits  COMPREHENSIVE METABOLIC PANEL - Abnormal; Notable for the following:    Potassium 3.3 (*)    Glucose, Bld 171 (*)    BUN 133 (*)    Creatinine, Ser 6.30 (*)    Calcium 6.5 (*)    Albumin 2.0 (*)    GFR calc non Af Amer 8 (*)    GFR calc Af Amer 9 (*)    Anion gap 18 (*)    All other components within  normal limits  PROTIME-INR - Abnormal; Notable for the following:    Prothrombin Time 16.5 (*)    All other components within normal limits  URINALYSIS, ROUTINE W REFLEX MICROSCOPIC - Abnormal; Notable for the following:    Hgb urine dipstick LARGE (*)    Protein, ur 100 (*)    All other components within normal limits  MRSA PCR SCREENING  URINE CULTURE  MAGNESIUM  URINE MICROSCOPIC-ADD ON  HEMOGLOBIN A1C  TYPE AND SCREEN  PREPARE RBC (CROSSMATCH)  PREPARE RBC (CROSSMATCH)  PREPARE PLATELET PHERESIS  PREPARE PLATELET PHERESIS    Imaging Review Dg Chest 2 View  07/18/2014  CLINICAL DATA:  Shortness of Breath  EXAM: CHEST  2 VIEW  COMPARISON:  07/14/2014  FINDINGS: The heart size and mediastinal contours are within normal limits. Both lungs are clear. The visualized skeletal structures are unremarkable.  IMPRESSION: No active cardiopulmonary disease.   Electronically Signed   By: Inez Catalina M.D.   On: 07/18/2014 10:40     EKG Interpretation   Date/Time:  Friday July 18 2014 09:38:17 EDT Ventricular Rate:  89 PR Interval:  158 QRS Duration: 99 QT Interval:  399 QTC Calculation: 485 R Axis:   13 Text Interpretation:  Sinus rhythm LVH with secondary repolarization  abnormality Borderline prolonged QT interval Baseline wander in lead(s) I  aVR No significant change since last tracing Confirmed by Johneisha Broaden  MD,  Amaury Kuzel ([id number]) on 07/18/2014 9:48:25 AM     CRITICAL CARE Performed by: Pamella Pert, S Total critical care time: 30 min Critical care time was exclusive of separately billable procedures and treating other patients. Critical care was necessary to treat or prevent imminent or life-threatening deterioration. Critical care was time spent personally by me on the following activities: development of treatment plan with patient and/or surrogate as well as nursing, discussions with consultants, evaluation of patient's response to treatment, examination of patient,  obtaining history from patient or surrogate, ordering and performing treatments and interventions, ordering and review of laboratory studies, ordering and review of radiographic studies, pulse oximetry and re-evaluation of patient's condition.  MDM   Final diagnoses:  Anemia due to other cause  Thrombocytopenia    10:15 AM 73 y.o. male w hx of DM, HTN, and recent diagnosis of leukemia who was sent here by his PCP due to worsening anemia and thrombocytopenia. The patient was recently discharged 3 days ago and has had hemoptysis and dark stools since that time. He was noted to be thrombocytopenic and anemic on his recent admission. He is currently stable on 2 L nasal cannula which he was discharged home on. He denies any chest pain, no shortness of breath, fever, or other pain. He is afebrile and vital signs are unremarkable here. Will get screening labs and imaging.  Consulted Dr. Ammie Dalton (heme-onc) and discussed case. He recommends transfusion of RBC and platelets. Ordered transfusion. Will admit to ICU under Dr. Jarrett Ables.     Pamella Pert, MD 07/18/14 [phone]

## 2014-07-18 NOTE — Progress Notes (Addendum)
Pt arrived on unit with 1 unit RBC running at 226ml/hr. Infusion completed at 1415 and IV flushed with NS. No infusion reaction noted. Pt VSS and pt in no apparent distress at conclusion of infusion.

## 2014-07-18 NOTE — ED Notes (Signed)
Attempted to call for report. Nurse was not ready. Secretary stated nurse will call back.

## 2014-07-18 NOTE — ED Notes (Signed)
Pt recently diagnosed with leukemia.  Pt has had increased weakness and fatigue for over a week.  Pt has productive cough.  Sputum is red/blood.  No fever.  Pt on home oxygen at 2l per [place].

## 2014-07-18 NOTE — Progress Notes (Signed)
  CARE MANAGEMENT ED NOTE 07/18/2014  Patient:  William Gaines, William Gaines   Account Number:  [id number]  Date Initiated:  07/18/2014  Documentation initiated by:  Jackelyn Poling  Subjective/Objective Assessment:   73 yr old [place] of [place] recently dx with leukemia.  had increased weakness & fatigue for over a week. productive cough,  Sputum is red/blood.  No fever. home oxygen at 2l per [place].     Subjective/Objective Assessment Detail:   Last hospitalization 07/08/14 to 07/15/14 for pancytopenia, DM due to underlying condition with Diabetic neuropathy D/c in improved &stable condition  07/18/14 1327 Pt seen by pcp staff on 07/17/14 for labs PCP called him 07/18/14 am to request he come to hospital. He was scheduled to see pcp in office again today Reports has fu appts in the future wiht Dr Marin Olp Has [facility]     Action/Plan:   ED CM spoke with pt and family about d/c f/u after last hospitalization   Action/Plan Detail:   NOtified [place] [facility] of ICU admission for pancytopenia   Anticipated DC Date:  07/21/2014     Status Recommendation to Physician:   Result of Recommendation:    Other ED [place]  Other  Outpatient Services - Pt will follow up   [place]   Choice offered to / List presented to:  C-1 Patient         [place].    Status of service:  Completed, signed off  ED Comments:   ED Comments Detail:

## 2014-07-18 NOTE — ED Notes (Signed)
Patient transported to X-ray 

## 2014-07-18 NOTE — H&P (Signed)
[facility] History and Physical  William Gaines GXQ:[id number] DOB: June 12, 1941 DOA: 07/18/2014  Referring physician: Dr. Aline Brochure PCP: Cristine Polio, MD /oncologist: Dr. Marin Olp  Chief Complaint: "I'm dying"/abnormal labs  HPI: William Gaines is a 73 y.o. 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 Patient stated that he was told his hemoglobin was 7 and platelet count was down to 3 and subsequently sent to the emergency room. Patient endorses ongoing fatigue, generalized weakness, melanotic stools, nausea and cough with hemoptysis. Patient denies any chest pain, no shortness of breath, no fever, no chills, no emesis, no dysuria, no abdominal pain, no palpitations. Patient was seen in the emergency room, labs obtained with comprehensive metabolic profile with a potassium of 3.3 BUN of 133 creatinine of 6.30 otherwise was within normal limits. CBC had a white count of 3.6 hemoglobin of 7.3 platelet count of less than 5. INR was 1.33. Hospitalist service was called to admit the patient for further evaluation and management.   Review of Systems: As per history of present illness otherwise negative. Constitutional:  No weight loss, night sweats, Fevers, chills. HEENT:  No headaches, Difficulty swallowing,Tooth/dental problems,Sore throat,  No sneezing, itching, ear ache, nasal congestion, post nasal drip,  Cardio-vascular:  No chest pain, Orthopnea, PND, no change in chronic swelling in lower extremities, anasarca, dizziness, palpitations  GI:  No heartburn, indigestion, abdominal pain, nausea, vomiting, diarrhea,  change in bowel habits, loss of appetite  Resp:  No shortness of breath with exertion or at rest. No excess mucus, no productive cough. No change in color of mucus.No wheezing.No chest wall deformity  Skin:  no rash or lesions.  GU:  no dysuria, change in color of urine, no urgency or frequency. No flank pain.  Musculoskeletal:  No joint pain or swelling. No decreased range of motion. No back pain.  Psych:  No change in mood or affect. No depression or anxiety. No memory loss.   Past Medical History  Diagnosis Date  . Diabetes mellitus   . Hypertension   . Nephrotic syndrome in diabetes mellitus   . Fever, unspecified 10/04/2011  . Chronic renal insufficiency, stage III (moderate)     diabetes, Hypertension, nephrotic syndrome  . Gout   . Cellulitis and abscess of leg     diabetic  . Hernia of abdominal wall     ombelicus  . Gout 10/04/2011  . Fracture, vertebra, pathologic 12/23/2011  . Diabetic peripheral neuropathy 04/20/2012  . Multiple myeloma, in relapse   . Multiple myeloma, in relapse 10/04/2011   Past Surgical History  Procedure Laterality Date  . Back surgery  2007 approx.    due to multiple myeloma   Social History:  reports that he quit smoking about 17 years ago. His smoking use included Cigarettes. He started smoking about 59 years ago. He has a 42 pack-year smoking history. His smokeless tobacco use includes Chew. He reports that he does not drink alcohol or use illicit drugs.  Allergies  Allergen Reactions  . Codeine     hallucinations  . Lorazepam [Lorazepam] Anxiety    Paradoxical agitation when given as pre med for bone marrow biopsy 09/11/06    Family History  Problem Relation Age of Onset  .  Hypertension Mother   . Diabetes Mother   . Cancer Brother   . Diabetes Son      Prior to Admission medications   Medication Sig Start Date End Date Taking? Authorizing Provider  allopurinol (ZYLOPRIM) 300 MG tablet Take 150 mg by mouth daily.    Yes  Historical Provider, MD  calcitRIOL (ROCALTROL) 0.25 MCG capsule Take 1 capsule (0.25 mcg total) by mouth daily. 07/15/14  Yes Modena Jansky, MD  famciclovir (FAMVIR) 500 MG tablet Take 0.5 tablets (250 mg total) by mouth daily. 07/15/14  Yes Modena Jansky, MD  famotidine (PEPCID) 20 MG tablet Take 20 mg by mouth 2 (two) times daily.   Yes Historical Provider, MD  furosemide (LASIX) 40 MG tablet Take 1 tablet (40 mg total) by mouth 2 (two) times daily. 07/15/14  Yes Modena Jansky, MD  insulin glargine (LANTUS) 100 UNIT/ML injection Inject 0.5 mLs (50 Units total) into the skin at bedtime. 07/15/14  Yes Modena Jansky, MD  metoprolol (LOPRESSOR) 50 MG tablet Take 0.5 tablets (25 mg total) by mouth 2 (two) times daily. 07/15/14  Yes Modena Jansky, MD  ondansetron (ZOFRAN) 8 MG tablet Take 8 mg by mouth every 8 (eight) hours as needed for nausea or vomiting (and before chemo).   Yes Historical Provider, MD   Physical Exam: Filed Vitals:   07/18/14 1255 07/18/14 1257 07/18/14 1315 07/18/14 1415  BP: 129/65 129/65 121/64 154/85  Pulse: 90 93 90   Temp:    97.9 F (36.6 C)  TempSrc:      Resp: $Remo'13 22 13   'onUJL$ Height:    '5\' 7"'$  (1.702 m)  Weight:    111.8 kg (246 lb 7.6 oz)  SpO2: 97% 97% 98%     Wt Readings from Last 3 Encounters:  07/18/14 111.8 kg (246 lb 7.6 oz)  07/15/14 115.6 kg (254 lb 13.6 oz)  05/07/14 115.214 kg (254 lb)    General:  Elderly gentleman sitting on the side of the bed in no acute cardiopulmonary distress.  Eyes: PERRLA, EOMI, normal lids, irises & conjunctiva ENT: grossly normal hearing, lips & tongue Neck: no LAD, masses or thyromegaly Cardiovascular: RRR, no m/r/g. 2+ bilateral lower extremity edema which the patient is chronic. Respiratory: CTA bilaterally, no w/r/r. Normal respiratory effort. Abdomen: soft, ntnd, positive bowel sounds, no rebound, no guarding Skin: no rash or induration seen on limited exam Musculoskeletal: grossly normal tone  BUE/BLE Psychiatric: grossly normal mood and affect, speech fluent and appropriate Neurologic: Alert and oriented x3. Cranial nerves II through XII are grossly intact. Sensation is intact. Visual fields are intact. No focal deficits.           Labs on Admission:  Basic Metabolic Panel:  Recent Labs Lab 07/12/14 0410 07/12/14 2057 07/13/14 0544 07/13/14 2231 07/14/14 0550 07/15/14 0436 07/18/14 0957 07/18/14 1002  NA 133* 129* 135*  --  133* 132*  --  138  K 4.5 4.4 4.3  --  4.2 3.8  --  3.3*  CL 94* 91* 98  --  94* 92*  --  98  CO2 21 17* 22  --  22 22  --  22  GLUCOSE 241* 424* 177* 420* 263* 318*  --  171*  BUN 87* 86* 87*  --  91* 105*  --  133*  CREATININE 6.35* 5.94* 6.01*  --  5.74* 5.55*  --  6.30*  CALCIUM 7.0* 6.9* 7.2*  --  7.0* 6.9*  --  6.5*  MG  --   --   --   --   --   --  1.5  --   PHOS 4.8*  --  4.9*  --  5.1*  --   --   --    Liver Function Tests:  Recent Labs Lab 07/12/14 0410 07/13/14 0544 07/14/14 0550 07/18/14 1002  AST  --   --   --  21  ALT  --   --   --  28  ALKPHOS  --   --   --  64  BILITOT  --   --   --  0.5  PROT  --   --   --  6.5  ALBUMIN 2.3* 2.2* 2.1* 2.0*   No results found for this basename: LIPASE, AMYLASE,  in the last 168 hours No results found for this basename: AMMONIA,  in the last 168 hours CBC:  Recent Labs Lab 07/12/14 0410 07/13/14 0544 07/14/14 0559 07/15/14 0436 07/18/14 1002  WBC 3.8* 5.1 4.8 4.9 3.6*  NEUTROABS  --   --   --   --  2.3  HGB 8.4* 8.4* 8.2* 7.7* 7.3*  HCT 24.1* 24.0* 23.2* 22.1* 21.2*  MCV 85.8 86.0 85.6 86.0 85.5  PLT 5* 9* 7* 7* <5*   Cardiac Enzymes: No results found for this basename: CKTOTAL, CKMB, CKMBINDEX, TROPONINI,  in the last 168 hours  BNP (last 3 results) No results found for this basename: PROBNP,  in the last 8760 hours CBG:  Recent Labs Lab 07/14/14 2143 07/15/14 0103 07/15/14 0423 07/15/14 0725 07/15/14 1136  GLUCAP 433* 387* 340* 230* 190*    Radiological  Exams on Admission: Dg Chest 2 View  07/18/2014   CLINICAL DATA:  Shortness of Breath  EXAM: CHEST  2 VIEW  COMPARISON:  07/14/2014  FINDINGS: The heart size and mediastinal contours are within normal limits. Both lungs are clear. The visualized skeletal structures are unremarkable.  IMPRESSION: No active cardiopulmonary disease.   Electronically Signed   By: Inez Catalina M.D.   On: 07/18/2014 10:40    EKG: Independently reviewed. T wave inversions in leads 12 aVL, V4 through V6. Similar to prior EKG.  Assessment/Plan Principal Problem:   Pancytopenia Active Problems:   Preleukemia   Severe thrombocytopenia   Multiple myeloma   Diabetes mellitus due to underlying condition with diabetic polyneuropathy   Hypertension associated with diabetes   Nephrotic syndrome   Diabetes mellitus type 2 with complications   Diabetic peripheral neuropathy   Chronic renal insufficiency, stage IV (severe)   Anemia   Myelodysplasia (myelodysplastic syndrome)   Melena   Cough with hemoptysis   Cardiomyopathy  #1 pancytopenia with severe thrombocytopenia Patient is presenting with pancytopenia with severe thrombocytopenia with platelet count less than 5 and a hemoglobin of 7.3. Likely secondary to preleukemia/myelodysplastic syndrome which was recently diagnosed. Will admit the patient to the ICU. Will transfuse 2 units of packed red blood cells. Transfuse a unit of platelets. Monitor counts. We'll consult with hematology/oncology for further evaluation and management.  #2 Preleukemia/MDS Will consult with oncology/hematology for further evaluation and management.  #3 chronic kidney disease stage IV/V/nephrotic syndrome Patient states making urine. Will continue patient calcitriol. Renal function seems to be at baseline. Will need to followup with his oncologist as outpatient.  #4 type 2 diabetes mellitus with complications/diabetic peripheral neuropathy Will check a hemoglobin A1c. Continue home  regimen of Lantus. Place on sliding scale insulin.  #5 cough with hemoptysis  Likely secondary to problem #1. Follow.  #6 melena Likely secondary to problem #1 with severe thrombocytopenia. Patient currently with a platelet count of less than 5 and that if he is a candidate for any endoscopic procedures at this time. We'll place on PPI via IV. Follow H&H. Transfuse 2 units packed red blood cells and one unit of platelets. Follow.  #7 cardiomyopathy/abnormal EKG/chronic systolic heart failure Patient denies any active chest pain. Patient with abnormal EKG may be secondary to demand secondary to problem #1. Will cycle cardiac enzymes every 6 hours x3. Patient with recent 2-D echo with EF of 30-35% with severe diffuse hypokinesis. At this point in time due to patient's severe thrombocytopenia and chronic kidney disease patient is likely not a candidate for any invasive procedures. Will hold patient's aspirin secondary to thrombocytopenia. We'll continue home regimen of Lasix, Lopressor. May benefit from outpatient cardiology followup.  #8 prophylaxis PPI for GI prophylaxis. SCDs for DVT prophylaxis.   Code Status: Full DVT Prophylaxis: SCDs Family Communication: Updated patient, daughter and brother-in-law at bedside. Disposition Plan: Admit to ICU  Time spent: 70 minutes  [facility] MD [facility] Pager [phone]  **Disclaimer: This note may have been dictated with voice recognition software. Similar sounding words can inadvertently be transcribed and this note may contain transcription errors which may not have been corrected upon publication of note.**

## 2014-07-18 NOTE — Consult Note (Signed)
[place]  [phone] Fax:([phone]) [place]     Reason for Consultation: Anemia and Thrombocytopenia   HPI: William Gaines is a 73 year old male followed by Dr. Marin Olp. He was hospitalized last week for anemia and thrombocytopenia. Thrombocytopenia was thought to be immune mediated. He received IVIG and Dexamethasone without response. He also received 4 units PRBCs during that hospitalization. He underwent a bone marrow biopsy on 07/10/14 which showed hypercellular marrow with 10% blasts. This is consistent with MDS. Cytogenetics are pending.   The patient had a CBC performed at his PCP office on 07/17/14. His PCP called him this am and told him that his platelet count was 3,000 and that he should come to the ER. He reports intermittent hemoptysis and black, tarry stools. He also notes a small amount of bleeding near his nose from the O2 tubing. Denies hematemesis. He denies chest pain, shortness of breath, abdominal pain, nausea, vomiting. Denies fevers. The patient has expressed that he wants to go home.   Past Medical History  Diagnosis Date  . Diabetes mellitus   . Hypertension   . Nephrotic syndrome in diabetes mellitus   . Fever, unspecified 10/04/2011  . Chronic renal insufficiency, stage III (moderate)     diabetes, Hypertension, nephrotic syndrome  . Gout   . Cellulitis and abscess of leg     diabetic  . Hernia of abdominal wall     ombelicus  . Gout 10/04/2011  . Fracture, vertebra, pathologic 12/23/2011  . Diabetic peripheral neuropathy 04/20/2012  . Multiple myeloma, in relapse   . Multiple myeloma, in relapse 10/04/2011  :    Past Surgical History  Procedure Laterality Date  . Back surgery  2007 approx.    due to multiple myeloma  :   CURRENT MEDS: Current Facility-Administered Medications  Medication Dose Route Frequency Provider Last Rate Last Dose  . 0.9 %  sodium chloride infusion   Intravenous Once  Pamella Pert, MD       Current Outpatient Prescriptions  Medication Sig Dispense Refill  . allopurinol (ZYLOPRIM) 300 MG tablet Take 150 mg by mouth daily.       . calcitRIOL (ROCALTROL) 0.25 MCG capsule Take 1 capsule (0.25 mcg total) by mouth daily.  30 capsule  0  . famciclovir (FAMVIR) 500 MG tablet Take 0.5 tablets (250 mg total) by mouth daily.  30 tablet  0  . famotidine (PEPCID) 20 MG tablet Take 20 mg by mouth 2 (two) times daily.      . furosemide (LASIX) 40 MG tablet Take 1 tablet (40 mg total) by mouth 2 (two) times daily.  30 tablet  0  . insulin glargine (LANTUS) 100 UNIT/ML injection Inject 0.5 mLs (50 Units total) into the skin at bedtime.      . metoprolol (LOPRESSOR) 50 MG tablet Take 0.5 tablets (25 mg total) by mouth 2 (two) times daily.      . ondansetron (ZOFRAN) 8 MG tablet Take 8 mg by mouth every 8 (eight) hours as needed for nausea or vomiting (and before chemo).          Allergies  Allergen Reactions  . Codeine     hallucinations  . Lorazepam [Lorazepam] Anxiety    Paradoxical agitation when given as pre med for bone marrow biopsy 09/11/06  :  Family History  Problem Relation Age of Onset  . Hypertension Mother   . Diabetes Mother   . Cancer Brother   .  Diabetes Son   :  History   Social History  . Marital Status: Married    Spouse Name: N/A    Number of Children: N/A  . Years of Education: N/A   Occupational History  . Not on file.   Social History Main Topics  . Smoking status: Former Smoker -- 1.00 packs/day for 42 years    Types: Cigarettes    Start date: 08/21/1954    Quit date: 10/03/1996  . Smokeless tobacco: Current User    Types: Chew     Comment: quit smoking 17years ago  . Alcohol Use: No  . Drug Use: No     Comment: quit approx. 15 years  . Sexual Activity: Not Currently   Other Topics Concern  . Not on file   Social History Narrative  . No narrative on file  :  REVIEW OF SYSTEM:  The rest of the 14-point review  of sytem was negative.   Exam: Patient Vitals for the past 24 hrs:  BP Temp Temp src Pulse Resp SpO2  07/18/14 1315 121/64 mmHg - - 90 13 98 %  07/18/14 1257 129/65 mmHg - - 93 22 97 %  07/18/14 1255 129/65 mmHg - - 90 13 97 %  07/18/14 1253 136/64 mmHg 97.8 F (36.6 C) Oral 93 16 97 %  07/18/14 1250 135/68 mmHg - - 91 20 98 %  07/18/14 1245 130/64 mmHg - - 90 15 97 %  07/18/14 1242 128/64 mmHg - - 91 16 93 %  07/18/14 1229 131/69 mmHg 97.9 F (36.6 C) Oral 90 16 95 %  07/18/14 1146 138/66 mmHg 97.7 F (36.5 C) Oral 92 27 100 %  07/18/14 0938 140/73 mmHg 98 F (36.7 C) Oral 89 20 95 %    General:  Chronically ill appearing gentleman in no acute distress.  Eyes:  no scleral icterus.  ENT:  There were no oropharyngeal lesions. Petechiae noted. Neck was without thyromegaly.  Lymphatics:  Negative cervical, supraclavicular or axillary adenopathy.  Respiratory: lungs were clear bilaterally without wheezing or crackles.  Cardiovascular:  Regular rate and rhythm, S1/S2, without murmur, rub or gallop.  There was no pedal edema.  GI:  abdomen was soft, flat, nontender, nondistended, without organomegaly.  Muscoloskeletal:  no spinal tenderness of palpation of vertebral spine.  Skin with multiple, scattered ecchymotic areas.  Neuro exam was nonfocal. Patient was alerted and oriented. Language was appropriate.  Mood was normal without depression.  Speech was not pressured.  Thought content was not tangential.    LABS:  Lab Results  Component Value Date   WBC 3.6* 07/18/2014   HGB 7.3* 07/18/2014   HCT 21.2* 07/18/2014   PLT <5* 07/18/2014   GLUCOSE 171* 07/18/2014   ALT 28 07/18/2014   AST 21 07/18/2014   NA 138 07/18/2014   K 3.3* 07/18/2014   CL 98 07/18/2014   CREATININE 6.30* 07/18/2014   BUN 133* 07/18/2014   CO2 22 07/18/2014   INR 1.33 07/18/2014   HGBA1C 7.5* 10/04/2011    Dg Chest 2 View  07/18/2014   CLINICAL DATA:  Shortness of Breath  EXAM: CHEST  2 VIEW  COMPARISON:  07/14/2014   FINDINGS: The heart size and mediastinal contours are within normal limits. Both lungs are clear. The visualized skeletal structures are unremarkable.  IMPRESSION: No active cardiopulmonary disease.   Electronically Signed   By: Inez Catalina M.D.   On: 07/18/2014 10:40   Dg Chest 2 View  07/14/2014  CLINICAL DATA:  Hemoptysis, followup edema.  EXAM: CHEST  2 VIEW  COMPARISON:  07/12/2014.  FINDINGS: Trachea is midline. Heart is at the upper limits of normal in size. Interval clearing of previously seen septal thickening. Tiny residual bilateral pleural effusions.  IMPRESSION: Resolved edema.  Residual tiny bilateral pleural effusions.   Electronically Signed   By: Leanna Battles M.D.   On: 07/14/2014 17:02   US Renal  07/09/2014   CLINICAL DATA:  Acute renal failure/chronic kidney disease.  EXAM: RENAL/URINARY TRACT ULTRASOUND COMPLETE  COMPARISON:  CT 12/30/2011  FINDINGS: Right Kidney:  Length: 11.6 cm. Mild increased cortical echogenicity. No hydronephrosis. Round hypoechoic mass over the mid to upper pole cortex measuring 1.8 cm with mild central increased echogenicity which may be a solid mass versus complicated cyst. Second round hypoechoic mass with increased through transmission over the upper pole measuring 1.6 cm likely a minimally complicated cyst.  Left Kidney:  Length: 9.6 cm. Mild increased cortical echogenicity. No hydronephrosis. Rounded hypoechoic 1.7 cm mass over the lower pole with increased through transmission likely a minimally complicated cyst.  Bladder:  Appears normal for degree of bladder distention.  IMPRESSION: Normal size kidneys with increased cortical echogenicity compatible with medical renal disease. No hydronephrosis.  Bilateral round hypoechoic masses likely complicated cysts. Recommend MRI if renal function will allow versus followup ultrasound 6 months.   Electronically Signed   By: Elberta Fortis M.D.   On: 07/09/2014 14:25   Ct Biopsy  07/10/2014   INDICATION: History of  multiple myeloma. Please perform CT guided bone marrow biopsy and aspiration.  EXAM: CT-GUIDED BONE MARROW ASPIRATION AND BIOPSY.  MEDICATIONS: None  ANESTHESIA/SEDATION: None  CONTRAST:  None  COMPLICATIONS: None immediate.  PROCEDURE: Informed consent was obtained from the patient following an explanation of the procedure, risks, benefits and alternatives. The patient understands, agrees and consents for the procedure. All questions were addressed. A time out was performed prior to the initiation of the procedure. The patient was positioned prone and non-contrast localization CT was performed of the pelvis to demonstrate the iliac marrow spaces. The operative site was prepped and draped in the usual sterile fashion.  Under sterile conditions and local anesthesia, a 22 gauge spinal needle was utilized for procedural planning. Next, an 11 gauge coaxial bone biopsy needle was advanced into the left iliac marrow space. Needle position was confirmed with CT imaging. Initially, bone marrow aspiration was performed. Next, a bone marrow biopsy was obtained with the 11 gauge outer bone marrow device. Samples were prepared with the cytotechnologist and deemed adequate. The needle was removed intact. Hemostasis was obtained with compression and a dressing was placed. The patient tolerated the procedure well without immediate post procedural complication.  IMPRESSION: Successful CT guided left iliac bone marrow aspiration and core biopsies.   Electronically Signed   By: Simonne Come M.D.   On: 07/10/2014 13:04   Dg Chest Port 1 View  07/12/2014   CLINICAL DATA:  Hemoptysis  EXAM: PORTABLE CHEST - 1 VIEW  COMPARISON:  830 hr  FINDINGS: Heterogeneous bar predominately interstitial opacities are present throughout both lungs worrisome for diffuse pneumonia or edema. Mild cardiomegaly. No pneumothorax.  IMPRESSION: Findings most consistent with diffuse edema or pneumonia.   Electronically Signed   By: Maryclare Bean M.D.   On:  07/12/2014 14:33   Dg Bone Survey Met  07/12/2014   CLINICAL DATA:  Multiple myeloma  EXAM: METASTATIC BONE SURVEY  COMPARISON:  PET-CT dated 04/21/2014.  Bone  survey dated 11/29/2013.  FINDINGS: 2.4 cm lytic lesion in the calvarium, grossly unchanged.  Degenerative changes of the cervical, thoracic, and lumbar spine. Stable postsurgical changes involving the lumbar spine.  Expansile mixed sclerotic and lytic lesion involving the left iliac crest. 1.7 cm lytic lesion in the left inferior pubic ramus, unchanged.  Visualized bilateral upper and lower extremities are within normal limits.  Lungs are grossly clear.  Heart is normal in size.  IMPRESSION: Stable lytic lesions in the calvarium and left inferior pubic ramus.  Stable mixed sclerotic and lytic lesion in the left iliac crest.  No evidence of new lesions in the visualized axial and appendicular skeleton.   Electronically Signed   By: Julian Hy M.D.   On: 07/12/2014 09:45   Bone marrow biopsy 07/10/14  - HYPERCELLULAR BONE MARROW WITH TRILINEAGE DYSPOIESIS AND INCREASE IN BLASTIC CELLS.  The bone marrow is generally hypercellular for age with dyspoietic changes involving all myeloid cell lines associated with increased number of blastic cells estimated by morphology at 10% of all cells in the sample. Immunoperoxidase stains performed on clot section show increased number of CD34 and CD117 positive immature mononuclear cells correlating with the morphology in the aspirate. Flow cytometric analysis shows a borderline number of myeloblastic cells estimated at 5% of all cells. The discrepancy between the flow cytometric results and the morphologic and immunoperoxidase findings may be related to sampling. Nonetheless, the overall features favor a myelodysplastic state, particularly refractory anemia with excess blasts. Correlation with cytogenetic and FISH studies is recommended. The plasma cell component represents 4% of all cells with lack of  large aggregates or sheets and by immunoperoxidase stains show a polyclonal staining pattern for kappa and lambda light chains. Specials stain for amyloid is negative.    ASSESSMENT AND PLAN:   1. MDS. 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 He has 10% blasts. Cytogenetics are pending.   2. Severe thrombocytopenia due to #1. Supportive care with platelet transfusions for platelet count <10,000 or bleeding. Recommend checking platelet count 1 hour post platelet transfusion today  3. Anemia due to #1. Transfuse 2 units PRBC today. Follow CBC. He received Aranesp 07/10/2014  4. Multiple myeloma. Diagnosed in 2007. S/P palliative XRT followed by Revlimid/Dexamethasone, Velcade/Melphalan, and then single agent Revlimid. Revlimid was stopped in 01/2014 due to worsening renal function. He has no evidence of myeloma on recent bone marrow biopsy (4% plasma cells). Recommend stopping Valtrex at this time.  5. Diabetes mellitus. On Lantus.  6. HTN. On Metoprolol and Lasix.  7. Chronic renal insufficiency, Stage IV. Felt to be unrelated to multiple myeloma  8. History of gout. On Allopurinol.     William Gaines was interviewed and examined. I discussed the situation with his family. He has severe anemia and thrombocytopenia. He is symptomatic with skin bleeding, hemoptysis, and dark stools. The thrombocytopenia did not respond to IVIG or nplate last week.  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  The prognosis is poor. Dr. Marin Olp is waiting on the cytogenetics from the bone marrow biopsy to decide on the indication for treatment versus comfort care. The recommendation will most likely be for comfort care.  I began a discussion regarding CODE STATUS with William Gaines and his daughter.  Hematology will continue following him over the weekend.

## 2014-07-19 LAB — CBC
HCT: 24.6 % — ABNORMAL LOW (ref 39.0–52.0)
HEMATOCRIT: 24.6 % — AB (ref 39.0–52.0)
HEMOGLOBIN: 8.8 g/dL — AB (ref 13.0–17.0)
Hemoglobin: 8.7 g/dL — ABNORMAL LOW (ref 13.0–17.0)
MCH: 30.4 pg (ref 26.0–34.0)
MCH: 30.3 pg (ref 26.0–34.0)
MCHC: 35.4 g/dL (ref 30.0–36.0)
MCHC: 35.8 g/dL (ref 30.0–36.0)
MCV: 86 fL (ref 78.0–100.0)
MCV: 84.8 fL (ref 78.0–100.0)
PLATELETS: 9 10*3/uL — AB (ref 150–400)
Platelets: 7 10*3/uL — CL (ref 150–400)
RBC: 2.86 MIL/uL — AB (ref 4.22–5.81)
RBC: 2.9 MIL/uL — ABNORMAL LOW (ref 4.22–5.81)
RDW: 14.1 % (ref 11.5–15.5)
RDW: 14.1 % (ref 11.5–15.5)
WBC: 3.6 10*3/uL — ABNORMAL LOW (ref 4.0–10.5)
WBC: 3.1 10*3/uL — ABNORMAL LOW (ref 4.0–10.5)

## 2014-07-19 LAB — COMPREHENSIVE METABOLIC PANEL
ALT: 27 U/L (ref 0–53)
ANION GAP: 18 — AB (ref 5–15)
AST: 28 U/L (ref 0–37)
Albumin: 2 g/dL — ABNORMAL LOW (ref 3.5–5.2)
Alkaline Phosphatase: 59 U/L (ref 39–117)
BILIRUBIN TOTAL: 0.5 mg/dL (ref 0.3–1.2)
BUN: 125 mg/dL — AB (ref 6–23)
CHLORIDE: 102 meq/L (ref 96–112)
CO2: 19 mEq/L (ref 19–32)
CREATININE: 6.09 mg/dL — AB (ref 0.50–1.35)
Calcium: 6.5 mg/dL — ABNORMAL LOW (ref 8.4–10.5)
GFR calc Af Amer: 9 mL/min — ABNORMAL LOW (ref >90)
GFR calc non Af Amer: 8 mL/min — ABNORMAL LOW (ref >90)
Glucose, Bld: 182 mg/dL — ABNORMAL HIGH (ref 70–99)
Potassium: 5.1 mEq/L (ref 3.7–5.3)
Sodium: 139 mEq/L (ref 137–147)
Total Protein: 6.3 g/dL (ref 6.0–8.3)

## 2014-07-19 LAB — URINE CULTURE

## 2014-07-19 LAB — GLUCOSE, CAPILLARY
GLUCOSE-CAPILLARY: 198 mg/dL — AB (ref 70–99)
Glucose-Capillary: 169 mg/dL — ABNORMAL HIGH (ref 70–99)
Glucose-Capillary: 156 mg/dL — ABNORMAL HIGH (ref 70–99)

## 2014-07-19 LAB — TYPE AND SCREEN
ABO/RH(D): O POS
Antibody Screen: NEGATIVE
Unit division: 0
Unit division: 0

## 2014-07-19 LAB — PREPARE PLATELET PHERESIS: Unit division: 0

## 2014-07-19 LAB — PROTIME-INR
INR: 1.25 (ref 0.00–1.49)
Prothrombin Time: 15.7 seconds — ABNORMAL HIGH (ref 11.6–15.2)

## 2014-07-19 MED ORDER — CALCIUM GLUCONATE 10 % IV SOLN
1.0000 g | Freq: Once | INTRAVENOUS | Status: AC
Start: 2014-07-19 — End: 2014-07-19
  Administered 2014-07-19: 1 g via INTRAVENOUS
  Filled 2014-07-19: qty 10

## 2014-07-19 MED ORDER — CETYLPYRIDINIUM CHLORIDE 0.05 % MT LIQD
7.0000 mL | Freq: Two times a day (BID) | OROMUCOSAL | Status: DC
  Administered 2014-07-19: 7 mL via OROMUCOSAL

## 2014-07-19 MED ORDER — SODIUM CHLORIDE 0.9 % IV SOLN: Freq: Once | INTRAVENOUS | Status: DC

## 2014-07-19 MED ORDER — PANTOPRAZOLE SODIUM 40 MG PO TBEC
40.0000 mg | DELAYED_RELEASE_TABLET | Freq: Two times a day (BID) | ORAL | Status: DC
  Administered 2014-07-19 – 2014-07-20 (×3): 40 mg via ORAL
  Filled 2014-07-19 (×3): qty 1

## 2014-07-19 MED ORDER — SODIUM CHLORIDE 0.9 % IV SOLN
Freq: Once | INTRAVENOUS | Status: AC
  Administered 2014-07-19: 10 mL via INTRAVENOUS

## 2014-07-19 NOTE — Progress Notes (Signed)
IP PROGRESS NOTE  Subjective:   Patient is feeling better this am. No active bleeding noted. He was not able to sleep much but has no new complaints.   Objective:  Vital signs in last 24 hours: Temp:  [97.6 F (36.4 C)-98.4 F (36.9 C)] 98.1 F (36.7 C) (09/12 0627) Pulse Rate:  [71-93] 71 (09/12 0627) Resp:  [13-27] 19 (09/12 0555) BP: (121-160)/(61-85) 136/61 mmHg (09/12 0627) SpO2:  [93 %-100 %] 99 % (09/12 0627) Weight:  [246 lb 7.6 oz (111.8 kg)] 246 lb 7.6 oz (111.8 kg) (09/11 1415) Weight change:  Last BM Date: 07/18/14  Intake/Output from previous day: 09/11 0701 - 09/12 0700 In: 1871.7 [P.O.:360; I.V.:6; Blood:1505.7] Out: 1400 [Urine:1400]  Mouth: mucous membranes moist, pharynx normal without lesions Resp: clear to auscultation bilaterally Cardio: regular rate and rhythm, S1, S2 normal, no murmur, click, rub or gallop GI: soft, non-tender; bowel sounds normal; no masses,  no organomegaly Extremities: extremities normal, atraumatic, no cyanosis or edema    Lab Results:  Recent Labs  07/18/14 2340 07/19/14 0350  WBC 3.3* 3.6*  HGB 8.6* 8.7*  HCT 24.2* 24.6*  PLT 5* 9*    BMET  Recent Labs  07/18/14 1002 07/19/14 0350  NA 138 139  K 3.3* 5.1  CL 98 102  CO2 22 19  GLUCOSE 171* 182*  BUN 133* 125*  CREATININE 6.30* 6.09*  CALCIUM 6.5* 6.5*    Studies/Results: Dg Chest 2 View  07/18/2014   CLINICAL DATA:  Shortness of Breath  EXAM: CHEST  2 VIEW  COMPARISON:  07/14/2014  FINDINGS: The heart size and mediastinal contours are within normal limits. Both lungs are clear. The visualized skeletal structures are unremarkable.  IMPRESSION: No active cardiopulmonary disease.   Electronically Signed   By: Inez Catalina M.D.   On: 07/18/2014 10:40    Medications: I have reviewed the patient's current medications.  Assessment/Plan:  73 year old with:  1. MDS based on bone marrow biopsy done on 07/10/2014. Cytogenetics are currently pending treatment  options will be discussed with Dr. Marin Olp next week.  2. Thrombocytopenia: This is related to MDS. His platelet  count after transfusion went up to 9 this morning. I would given another transfusion to get his platelets are both 10 at baseline. Your rectal bleeding his platelet counts these to be above 20. I think about it and would be sufficient for discharge at this time.  3. Anemia: Related to MDS his hemoglobin is stable at 8.7. She has limited functionality and I don't think he needs to have a higher hemoglobin at this time.  4. Insufficiency: This is chronic in nature with stable creatinine of close to 6.5.  5. Disposition: He appears to be clinically stable and I have no objections of discharge tomorrow after a platelet transfusion today. He has a followup with Dr. Marin Olp on September 17 at 1 PM.   LOS: 1 day   [facility] 07/19/2014, 7:46 AM

## 2014-07-19 NOTE — Evaluation (Addendum)
Physical Therapy Evaluation Patient Details Name: William Gaines MRN: [id number] DOB: May 02, 1941 Today's Date: 07/19/2014   History of Present Illness  73 yo male adm 07/18/14 after he was  sent to ED  from his 7 office d/t abnormal labs, recently dx'd MDS, platelets remain extremely low and pt for infusion later today ; Pt was recently admitted 9/1-9/8 due to thrombocytopenia; PMHx: multiple myeloma, gout, pathological vertebral fx 2013, DM, CKD  Clinical Impression  Pt will benefit from PT in acute setting, will follow; pt is very anxious to get home; Family supportive and present for eval today; No DME needs    Follow Up Recommendations No PT follow up    Equipment Recommendations  None recommended by PT    Recommendations for Other Services       Precautions / Restrictions Precautions Precautions: Fall      Mobility  Bed Mobility               General bed mobility comments: pt up in recliner on arrival  Transfers Overall transfer level: Needs assistance Equipment used: Rolling walker (2 wheeled) Transfers: Sit to/from Stand Sit to Stand: Min guard         General transfer comment: verbal cues for safety  Ambulation/Gait Ambulation/Gait assistance: Min guard Ambulation Distance (Feet): 60 Feet Assistive device: Rolling walker (2 wheeled) Gait Pattern/deviations: Step-through pattern;Decreased stride length;Trunk flexed     General Gait Details: verbal cues for posture and RW position from self;                                    On 3L O2 dudring amb, sats >92% throughout Stairs            Wheelchair Mobility    Modified Rankin (Stroke Patients Only)       Balance Overall balance assessment: Needs assistance         Standing balance support: Bilateral upper extremity supported Standing balance-Leahy Scale: Fair                               Pertinent Vitals/Pain Pain Assessment: No/denies pain  BP 154/72     Home Living Family/patient expects to be discharged to:: Private residence Living Arrangements: Spouse/significant other   Type of Home: House Home Access: Ramped entrance     Home Layout: One level Home Equipment: Environmental consultant - 2 wheels;Cane - single point;Wheelchair - manual;Bedside commode      Prior Function Level of Independence: Independent with assistive device(s);Needs assistance   Gait / Transfers Assistance Needed: son closely supervises  ADL's / Homemaking Assistance Needed: son assists with bath        Hand Dominance        Extremity/Trunk Assessment   Upper Extremity Assessment: Generalized weakness           Lower Extremity Assessment: Generalized weakness         Communication   Communication: HOH  Cognition Arousal/Alertness: Awake/alert Behavior During Therapy: Flat affect Overall Cognitive Status: Within Functional Limits for tasks assessed                      General Comments General comments (skin integrity, edema, etc.): very close guarding and chair follow for safety due to pt's platelet count; LEs remain edematous    Exercises  Assessment/Plan    PT Assessment Patient needs continued PT services  PT Diagnosis Difficulty walking   PT Problem List Decreased strength;Decreased activity tolerance;Decreased mobility;Decreased knowledge of use of DME  PT Treatment Interventions DME instruction;Gait training;Functional mobility training;Therapeutic activities;Therapeutic exercise;Patient/family education   PT Goals (Current goals can be found in the Care Plan section) Acute Rehab PT Goals Patient Stated Goal: to go home and soon PT Goal Formulation: With patient Time For Goal Achievement: 07/26/14 Potential to Achieve Goals: Good    Frequency Min 3X/week   Barriers to discharge        Co-evaluation               End of Session Equipment Utilized During Treatment: Gait belt Activity Tolerance: Patient  tolerated treatment well Patient left: in chair;with call bell/phone within reach;with family/visitor present Nurse Communication: Mobility status         Time: 1212-1232 PT Time Calculation (min): 20 min   Charges:   PT Evaluation $Initial PT Evaluation Tier I: 1 Procedure PT Treatments $Gait Training: 8-22 mins   PT G CodesKenyon Gaines, PT Pager: [phone] 07/19/2014   William Gaines 07/19/2014, 1:14 PM

## 2014-07-19 NOTE — Progress Notes (Signed)
Critical result of Platelet of 5 called to Walden Field NP, order received to Transfuse I pack platelet. No active bleed noted from patient.

## 2014-07-19 NOTE — Progress Notes (Addendum)
[facility] PROGRESS NOTE  William Gaines IZT:[id number] DOB: 1941/05/15 DOA: 07/18/2014 PCP: Cristine Polio, MD  Assessment/Plan  pancytopenia with severe thrombocytopenia and ongoing melena and hemoptysis -  Continue to transfuse platelets, goal plt level of at least > 20K  -  Post transfusion plt level at 9AM  Pancytopenia due to malignancy, chemotherapy and acute blood loss anemia -  Repeat hgb this AM due to ongoing GIB -  Transfuse to keep hemoglobin > 8 mg/dl  Preleukemia/MDS -  Appreciate oncology assistance -  Pt has follow up with Dr. 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, A1c 7.3 -  CBG at goal -  Continue lantus 50 units daily -  Continue low dose SSI   Cough with hemoptysis, CXR without pneumonia -  Start guaifenesin for symptom management -  tx of thrombocytopenia as above -  No fevers to suggest underlying viral infection  Melena -  Okay to change to oral PPI -  No carafate or maalox due to CKD -  Continue prn transfusions of blood and plt -  Not a candidate for procedures due to his thrombocytopenia  Cardiomyopathy/abnormal EKG/chronic systolic heart failure EF of 30-35% with severe diffuse hypokinesis with chronic lower extremity edema -  Continue oral lasix -  On BB, no ACEI/spironolactone due to CKD -  Daily weights -  Give lasix post transfusions to minimize risk of acute heart failure.    Diet:  diabetic Access:  PIV IVF:  off Proph:  SCD  Code Status: full Family Communication: patient alone Disposition Plan: Patient states he wants to go home ASAP   Consultants:  Oncology  Procedures:  9/11 Tx 2 units PRBC, 1plt  9/12 Tx 1 plt   Antibiotics:  none    HPI/Subjective:  Continues to have hemoptysis and melena.  Three melanotic stools overnight.  Denies fevers, SOB.  Has mild intermittent nausea and states his appetite has been poor.  Objective: Filed Vitals:   07/19/14 0430 07/19/14 0455 07/19/14 0555 07/19/14 0627  BP: 160/76 143/73 142/61 136/61  Pulse: 79 74 74 71  Temp: 97.8 F (36.6 C) 97.6 F (36.4 C) 97.7 F (36.5 C) 98.1 F (36.7 C)  TempSrc:  Oral Oral Oral  Resp: _0 Height:      Weight:      SpO2: 100% 99% 99% 99%    Intake/Output Summary (Last 24 hours) at 07/19/14 0746 Last data filed at 07/19/14 3825  Gross per 24 hour  Intake 1871.67 ml  Output   1400 ml  Net 471.67 ml   Filed Weights   07/18/14 1415  Weight: 111.8 kg (246 lb 7.6 oz)    Exam:   General:  WM, No acute distress  HEENT:  NCAT, MMM  Cardiovascular:  RRR, nl S1, S2 no mrg, 2+ pulses, warm extremities  Respiratory:  rhonchorous BS, no focal rales or wheeze, no increased WOB  Abdomen:   NABS, soft, NT/ND, 5 cm hernia RLQ  MSK:   Normal tone and bulk, 2+ bilateral pitting LEE  Neuro:  Grossly intact  Data Reviewed: Basic Metabolic Panel:  Recent Labs Lab 07/13/14 0544 07/13/14 2231 07/14/14 0550 07/15/14 0436 07/18/14 0957 07/18/14 1002 07/19/14 0350  NA 135*  --  133* 132*  --  138 139  K 4.3  --  4.2 3.8  --  3.3* 5.1  CL 98  --  94* 92*  --  98 102  CO2 22  --  22 22  --  22 19  GLUCOSE 177* 420* 263* 318*  --  171* 182*  BUN 87*  --  91* 105*  --  133* 125*  CREATININE 6.01*  --  5.74* 5.55*  --  6.30* 6.09*  CALCIUM 7.2*  --  7.0* 6.9*  --  6.5* 6.5*  MG  --   --   --   --  1.5  --   --   PHOS 4.9*  --  5.1*  --   --   --   --    Liver Function Tests:  Recent Labs Lab 07/13/14 0544 07/14/14 0550 07/18/14 1002 07/19/14 0350  AST  --   --  21 28  ALT  --   --  28 27  ALKPHOS  --   --  64 59  BILITOT  --   --  0.5 0.5  PROT  --   --  6.5 6.3  ALBUMIN 2.2* 2.1* 2.0* 2.0*   No results found for  this basename: LIPASE, AMYLASE,  in the last 168 hours No results found for this basename: AMMONIA,  in the last 168 hours CBC:  Recent Labs Lab 07/14/14 0559 07/15/14 0436 07/18/14 1002 07/18/14 2340 07/19/14 0350  WBC 4.8 4.9 3.6* 3.3* 3.6*  NEUTROABS  --   --  2.3  --   --   HGB 8.2* 7.7* 7.3* 8.6* 8.7*  HCT 23.2* 22.1* 21.2* 24.2* 24.6*  MCV 85.6 86.0 85.5 84.3 86.0  PLT 7* 7* <5* 5* 9*   Cardiac Enzymes: No results found for this basename: CKTOTAL, CKMB, CKMBINDEX, TROPONINI,  in the last 168 hours BNP (last 3 results) No results found for this basename: PROBNP,  in the last 8760 hours CBG:  Recent Labs Lab 07/15/14 0423 07/15/14 0725 07/15/14 1136 07/18/14 1727 07/18/14 2140  GLUCAP 340* 230* 190* 162* 207*    Recent Results (from the past 240 hour(s))  CLOSTRIDIUM DIFFICILE BY PCR     Status: None   Collection Time    07/09/14 11:06 AM      Result Value Ref Range Status   C difficile by pcr NEGATIVE  NEGATIVE Final   Comment: Performed at [facility]     Status: None   Collection Time    07/10/14 11:04 PM      Result Value Ref Range Status   Specimen Description URINE, RANDOM   Final   Special Requests none   Final   Culture  Setup Time     Final   Value: 07/11/2014 01:20     Performed at [place]     Final   Value: 4,000 COLONIES/ML     Performed at [facility]   Culture     Final   Value: INSIGNIFICANT GROWTH     Performed at [facility]   Report Status 07/12/2014 FINAL   Final  MRSA PCR SCREENING     Status: None   Collection Time    07/18/14  2:12 PM      Result Value Ref Range Status   MRSA by PCR NEGATIVE  NEGATIVE Final   Comment:            The GeneXpert MRSA Assay (  FDA     approved for NASAL specimens     only), is one component of a     comprehensive MRSA colonization     surveillance program. It is not     intended to diagnose MRSA     infection nor to guide or      monitor treatment for     MRSA infections.     Studies: Dg Chest 2 View  07/18/2014   CLINICAL DATA:  Shortness of Breath  EXAM: CHEST  2 VIEW  COMPARISON:  07/14/2014  FINDINGS: The heart size and mediastinal contours are within normal limits. Both lungs are clear. The visualized skeletal structures are unremarkable.  IMPRESSION: No active cardiopulmonary disease.   Electronically Signed   By: Inez Catalina M.D.   On: 07/18/2014 10:40    Scheduled Meds: . sodium chloride   Intravenous Once  . sodium chloride   Intravenous Once  . allopurinol  150 mg Oral Daily  . calcitRIOL  0.25 mcg Oral Daily  . furosemide  40 mg Oral BID  . insulin aspart  0-9 Units Subcutaneous TID WC  . insulin glargine  50 Units Subcutaneous QHS  . metoprolol  25 mg Oral BID  . pantoprazole (PROTONIX) IV  40 mg Intravenous Q12H  . sodium chloride  3 mL Intravenous Q12H  . sodium chloride  3 mL Intravenous Q12H   Continuous Infusions:   Principal Problem:   Pancytopenia Active Problems:   Multiple myeloma   Diabetes mellitus due to underlying condition with diabetic polyneuropathy   Hypertension associated with diabetes   Nephrotic syndrome   Diabetes mellitus type 2 with complications   Diabetic peripheral neuropathy   Chronic renal insufficiency, stage IV (severe)   Anemia   Myelodysplasia (myelodysplastic syndrome)   Preleukemia   Melena   Cough with hemoptysis   Severe thrombocytopenia   Cardiomyopathy    Time spent: 30 min    Canary Fister, [place] [facility] Pager [phone]. If 7PM-7AM, please contact night-coverage at [email or web address], password [facility] 07/19/2014, 7:46 AM  LOS: 1 day

## 2014-07-20 DIAGNOSIS — N184 Chronic kidney disease, stage 4 (severe): Secondary | ICD-10-CM

## 2014-07-20 LAB — CBC
HEMATOCRIT: 24.3 % — AB (ref 39.0–52.0)
HEMOGLOBIN: 8.6 g/dL — AB (ref 13.0–17.0)
MCH: 30.2 pg (ref 26.0–34.0)
MCHC: 35.4 g/dL (ref 30.0–36.0)
MCV: 85.3 fL (ref 78.0–100.0)
Platelets: 8 10*3/uL — CL (ref 150–400)
RBC: 2.85 MIL/uL — ABNORMAL LOW (ref 4.22–5.81)
RDW: 14 % (ref 11.5–15.5)
WBC: 3.1 10*3/uL — ABNORMAL LOW (ref 4.0–10.5)

## 2014-07-20 LAB — PREPARE PLATELET PHERESIS
UNIT DIVISION: 0
Unit division: 0
Unit division: 0

## 2014-07-20 LAB — BASIC METABOLIC PANEL
Anion gap: 19 — ABNORMAL HIGH (ref 5–15)
BUN: 115 mg/dL — AB (ref 6–23)
CHLORIDE: 98 meq/L (ref 96–112)
CO2: 20 meq/L (ref 19–32)
Calcium: 7 mg/dL — ABNORMAL LOW (ref 8.4–10.5)
Creatinine, Ser: 5.77 mg/dL — ABNORMAL HIGH (ref 0.50–1.35)
GFR calc non Af Amer: 9 mL/min — ABNORMAL LOW (ref >90)
GFR, EST AFRICAN AMERICAN: 10 mL/min — AB (ref >90)
Glucose, Bld: 103 mg/dL — ABNORMAL HIGH (ref 70–99)
Potassium: 3.6 mEq/L — ABNORMAL LOW (ref 3.7–5.3)
Sodium: 137 mEq/L (ref 137–147)

## 2014-07-20 MED ORDER — POTASSIUM CHLORIDE CRYS ER 20 MEQ PO TBCR
40.0000 meq | EXTENDED_RELEASE_TABLET | Freq: Once | ORAL | Status: AC
  Administered 2014-07-20: 40 meq via ORAL
  Filled 2014-07-20: qty 2

## 2014-07-20 MED ORDER — PANTOPRAZOLE SODIUM 40 MG PO TBEC: 40.0000 mg | DELAYED_RELEASE_TABLET | Freq: Two times a day (BID) | ORAL | Status: DC

## 2014-07-20 NOTE — Progress Notes (Addendum)
IP PROGRESS NOTE  Subjective:   Patient is feeling the same today. He still reports occasional dark stools but felt improving. No active bleeding noted. He is not reporting any epistaxis or hematochezia. He has not reported any chest pain or shortness of breath. He appears to be comfortable sitting in a chair. Hemodynamically appears stable.  Objective:  Vital signs in last 24 hours: Temp:  [97.7 F (36.5 C)-98.4 F (36.9 C)] 97.7 F (36.5 C) (09/13 0400) Pulse Rate:  [67-89] 70 (09/13 0000) Resp:  [15-21] 16 (09/13 0000) BP: (135-155)/(48-72) 140/69 mmHg (09/13 0000) SpO2:  [92 %-100 %] 100 % (09/13 0000) Weight change:  Last BM Date: 07/18/14  Intake/Output from previous day: 09/12 0701 - 09/13 0700 In: 2046 [P.O.:945; I.V.:326; Blood:525] Out: 2600 [Urine:2600] Alert awake gentleman not in any distress eating breakfast this morning. Mouth: mucous membranes moist, pharynx normal without lesions Resp: clear to auscultation bilaterally Cardio: regular rate and rhythm, S1, S2 normal, no murmur, click, rub or gallop GI: soft, non-tender; bowel sounds normal; no masses,  no organomegaly Extremities: extremities normal, atraumatic, no cyanosis or edema    Lab Results:  Recent Labs  07/19/14 0838 07/20/14 0403  WBC 3.1* 3.1*  HGB 8.8* 8.6*  HCT 24.6* 24.3*  PLT 7* 8*    BMET  Recent Labs  07/19/14 0350 07/20/14 0403  NA 139 137  K 5.1 3.6*  CL 102 98  CO2 19 20  GLUCOSE 182* 103*  BUN 125* 115*  CREATININE 6.09* 5.77*  CALCIUM 6.5* 7.0*    Studies/Results: Dg Chest 2 View  07/18/2014   CLINICAL DATA:  Shortness of Breath  EXAM: CHEST  2 VIEW  COMPARISON:  07/14/2014  FINDINGS: The heart size and mediastinal contours are within normal limits. Both lungs are clear. The visualized skeletal structures are unremarkable.  IMPRESSION: No active cardiopulmonary disease.   Electronically Signed   By: Inez Catalina M.D.   On: 07/18/2014 10:40    Medications: I have  reviewed the patient's current medications.  Assessment/Plan:  73 year old with:  1. MDS based on bone marrow biopsy done on 07/10/2014. Cytogenetics are currently pending treatment options will be discussed with Dr. Marin Olp next week.  2. Thrombocytopenia: This is related to MDS. His platelet  count have been studied between 7000 and 8000. 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 I would transfuse only if his rectal bleeding. He did have melena yesterday and its possibly he would continue to have that. But he is hemodynamically stable and I see no evidence of active bleeding at this time.  3. Anemia: Related to MDS his hemoglobin is stable at 8.6. She has limited functionality and I don't think he needs to have a higher hemoglobin at this time. I notice no drop in his hemoglobin despite a presumed GI bleed.  4. Insufficiency: This is chronic in nature with stable creatinine of close to 6.5.  5. Disposition: He appears to be clinically stable and I have no objections of discharge today. He has a followup with Dr. Marin Olp on September 17 at 1 PM. He is demanding to be discharged today and I feel that it is reasonable to do at this time.   LOS: 2 days   Zeb Rawl 07/20/2014, 7:30 AM

## 2014-07-20 NOTE — Progress Notes (Signed)
Patient discharged to home. Patient education provided with a handout included.  Son participated in discharge education discussion.

## 2014-07-20 NOTE — Progress Notes (Signed)
CARE MANAGEMENT NOTE 07/20/2014  Patient:  JENSON, BEEDLE   Account Number:  [id number]  Date Initiated:  07/19/2014  Documentation initiated by:  [facility]  Subjective/Objective Assessment:   pancytopenia with severe thrombocytopenia     Action/Plan:   active with [facility], has home oxygen   Anticipated DC Date:  07/20/2014   Anticipated DC Plan:  [place]  CM consult      [facility] Choice  NA   Choice offered to / List presented to:          [facility] arranged  HH-2 PT  HH-1 RN  HH-3 OT      [place].   Status of service:  Completed, signed off Medicare Important Message given?  NA - LOS <3 / Initial given by admissions (If response is "NO", the following Medicare IM given date fields will be blank) Date Medicare IM given:   Medicare IM given by:   Date Additional Medicare IM given:   Additional Medicare IM given by:    Discharge Disposition:  [place]  Per UR Regulation:    If discussed at Long Length of Stay Meetings, dates discussed:    Comments:  07/20/2014 [facility] aware of dc home with HH. Jonnie Finner RN CCM Case Mgmt phone [phone]

## 2014-07-20 NOTE — Discharge Summary (Signed)
Physician Discharge Summary  William Gaines KWI:[id number] DOB: 12/18/1940 DOA: 07/18/2014  PCP: Cristine Polio, MD  Admit date: 07/18/2014 Discharge date: 07/20/2014  Recommendations for Outpatient Follow-up:  1. Follow up with Dr. Marin Olp on Tuesday.  Please obtain repeat CBC and screen for ongoing bleeding.  Ongoing goals of care conversations.    Discharge Diagnoses:  Principal Problem:   Pancytopenia Active Problems:   Multiple myeloma   Diabetes mellitus due to underlying condition with diabetic polyneuropathy   Hypertension associated with diabetes   Nephrotic syndrome   Diabetes mellitus type 2 with complications   Diabetic peripheral neuropathy   Chronic renal insufficiency, stage IV (severe)   Anemia   Myelodysplasia (myelodysplastic syndrome)   Preleukemia   Melena   Cough with hemoptysis   Severe thrombocytopenia   Cardiomyopathy   Discharge Condition:  Fair, improved  Diet recommendation: diabetic diet  Wt Readings from Last 3 Encounters:  07/18/14 111.8 kg (246 lb 7.6 oz)  07/15/14 115.6 kg (254 lb 13.6 oz)  05/07/14 115.214 kg (254 lb)    History of present illness:  73 yo M with preleukemia/myelodysplastic syndrome, prior history of multiple myeloma and 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  His hemoglobin was 7 and platelet count was down to 3 and he was having fatigue, generalized weakness, melanotic stools, nausea and cough with hemoptysis.    Hospital Course:   Pancytopenia due to preleukemia/MDS and acute blood loss from GIB and hemoptysis - Appreciate oncology assistance.  GOC were discussed and patient will continue this conversation with his primary oncologist.  He has a very guarded  prognosis and may benefit from palliative care/hospice given his ongoing pancytopenia and bleeding despite transfusions.  Dr. Alen Blew has recommended patient be discharged to follow up with Dr. Marin Olp on Tuesday at the patient's request.    Severe thrombocytopenia and ongoing melena and hemoptysis.  He was transfused 4 units of platelets and his platelet count remained unchanged.  He continued to have hemoptysis and small volume melanotic stools.  He will likely need additional transfusions which can be done at the infusion center if needed.  No need to check post transfusion platelet levels due to his refractory plt.    Acute blood loss anemia due to hemoptysis and melena.  CXR was negative for pneumonia.  He was started on guaifenesin, PPI.  Carafate was not given due to aluminum content which is contraindicated in CKD stage IV/V.  He was not a candidate for upper endoscopy at this time due to his refractory thrombocytopenia and has therefore been managed medically.  He was transfused 2 units of PRBC and his hemoglobin stabilized in the mid-8 range.  He will need a repeat CBC in 2 days at Dr. Antonieta Pert office.  He may return to the hospital sooner if he has increased rectal bleeding or hemoptysis.    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  Creatinine stable around 6.  He continued calcitriol.  His calcium was repleted and now corrects to wnl.    Type 2 diabetes mellitus with complications/diabetic peripheral neuropathy, A1c 7.3.  Continued lantus 50 units daily and gave low dose SSI.  He may resume his home regimen.    Cardiomyopathy/abnormal EKG/chronic systolic heart failure EF of 30-35% with severe diffuse hypokinesis with chronic  lower extremity edema.  He continued oral lasix.  On BB and blood pressure remained stable.  No ACEI/spironolactone due to CKD.  He was given IV lasix post transfusions to minimize risk of acute heart failure.   Hypokalemia due to lasix,  mild and given CKD with recent borderline hyperkalemia, potassium was not repleted.    Consultants:  Oncology Procedures:  9/11 Tx 2 units PRBC, 1plt  9/12 Tx 2 plt Antibiotics:  none    Discharge Exam: Filed Vitals:   07/20/14 0400  BP:   Pulse:   Temp: 97.7 F (36.5 C)  Resp:    Filed Vitals:   07/19/14 2229 07/20/14 0000 07/20/14 0100 07/20/14 0400  BP: 135/58 140/69 140/69   Pulse: 68 70 68   Temp:  98 F (36.7 C)  97.7 F (36.5 C)  TempSrc:  Oral  Oral  Resp:  16 18   Height:      Weight:      SpO2:  100% 100%    General: WM, No acute distress HEENT: NCAT, MMM  Cardiovascular: RRR, nl S1, S2 no mrg, 2+ pulses, warm extremities  Respiratory: Rhonchorous BS, rales at RLL, no wheezes, no increased WOB  Abdomen: NABS, soft, NT/ND, 5 cm hernia RLQ  MSK: Normal tone and bulk, 2+ bilateral pitting LEE  Neuro: Grossly intact   Discharge Instructions     Medication List    ASK your doctor about these medications       allopurinol 300 MG tablet  Commonly known as:  ZYLOPRIM  Take 150 mg by mouth daily.     calcitRIOL 0.25 MCG capsule  Commonly known as:  ROCALTROL  Take 1 capsule (0.25 mcg total) by mouth daily.     famciclovir 500 MG tablet  Commonly known as:  FAMVIR  Take 0.5 tablets (250 mg total) by mouth daily.     famotidine 20 MG tablet  Commonly known as:  PEPCID  Take 20 mg by mouth 2 (two) times daily.     furosemide 40 MG tablet  Commonly known as:  LASIX  Take 1 tablet (40 mg total) by mouth 2 (two) times daily.     insulin glargine 100 UNIT/ML injection  Commonly known as:  LANTUS  Inject 0.5 mLs (50 Units total) into the skin at bedtime.     metoprolol 50 MG tablet  Commonly known as:  LOPRESSOR  Take 0.5 tablets (25 mg total) by mouth 2 (two) times daily.     ondansetron 8 MG tablet  Commonly known as:  ZOFRAN  Take 8 mg by mouth every 8 (eight) hours as needed for nausea or vomiting (and before chemo).          The  results of significant diagnostics from this hospitalization (including imaging, microbiology, ancillary and laboratory) are listed below for reference.    Significant Diagnostic Studies: Dg Chest 2 View  07/18/2014   CLINICAL DATA:  Shortness of Breath  EXAM: CHEST  2 VIEW  COMPARISON:  07/14/2014  FINDINGS: The heart size and mediastinal contours are within normal limits. Both lungs are clear. The visualized skeletal structures are unremarkable.  IMPRESSION: No active cardiopulmonary disease.   Electronically Signed   By: Inez Catalina M.D.   On: 07/18/2014 10:40   Dg Chest 2 View  07/14/2014   CLINICAL DATA:  Hemoptysis, followup edema.  EXAM: CHEST  2 VIEW  COMPARISON:  07/12/2014.  FINDINGS: Trachea is midline. Heart is at the upper limits of normal in  size. Interval clearing of previously seen septal thickening. Tiny residual bilateral pleural effusions.  IMPRESSION: Resolved edema.  Residual tiny bilateral pleural effusions.   Electronically Signed   By: Leanna Battles M.D.   On: 07/14/2014 17:02   US Renal  07/09/2014   CLINICAL DATA:  Acute renal failure/chronic kidney disease.  EXAM: RENAL/URINARY TRACT ULTRASOUND COMPLETE  COMPARISON:  CT 12/30/2011  FINDINGS: Right Kidney:  Length: 11.6 cm. Mild increased cortical echogenicity. No hydronephrosis. Round hypoechoic mass over the mid to upper pole cortex measuring 1.8 cm with mild central increased echogenicity which may be a solid mass versus complicated cyst. Second round hypoechoic mass with increased through transmission over the upper pole measuring 1.6 cm likely a minimally complicated cyst.  Left Kidney:  Length: 9.6 cm. Mild increased cortical echogenicity. No hydronephrosis. Rounded hypoechoic 1.7 cm mass over the lower pole with increased through transmission likely a minimally complicated cyst.  Bladder:  Appears normal for degree of bladder distention.  IMPRESSION: Normal size kidneys with increased cortical echogenicity compatible with  medical renal disease. No hydronephrosis.  Bilateral round hypoechoic masses likely complicated cysts. Recommend MRI if renal function will allow versus followup ultrasound 6 months.   Electronically Signed   By: Elberta Fortis M.D.   On: 07/09/2014 14:25   Ct Biopsy  07/10/2014   INDICATION: History of multiple myeloma. Please perform CT guided bone marrow biopsy and aspiration.  EXAM: CT-GUIDED BONE MARROW ASPIRATION AND BIOPSY.  MEDICATIONS: None  ANESTHESIA/SEDATION: None  CONTRAST:  None  COMPLICATIONS: None immediate.  PROCEDURE: Informed consent was obtained from the patient following an explanation of the procedure, risks, benefits and alternatives. The patient understands, agrees and consents for the procedure. All questions were addressed. A time out was performed prior to the initiation of the procedure. The patient was positioned prone and non-contrast localization CT was performed of the pelvis to demonstrate the iliac marrow spaces. The operative site was prepped and draped in the usual sterile fashion.  Under sterile conditions and local anesthesia, a 22 gauge spinal needle was utilized for procedural planning. Next, an 11 gauge coaxial bone biopsy needle was advanced into the left iliac marrow space. Needle position was confirmed with CT imaging. Initially, bone marrow aspiration was performed. Next, a bone marrow biopsy was obtained with the 11 gauge outer bone marrow device. Samples were prepared with the cytotechnologist and deemed adequate. The needle was removed intact. Hemostasis was obtained with compression and a dressing was placed. The patient tolerated the procedure well without immediate post procedural complication.  IMPRESSION: Successful CT guided left iliac bone marrow aspiration and core biopsies.   Electronically Signed   By: Simonne Come M.D.   On: 07/10/2014 13:04   Dg Chest Port 1 View  07/12/2014   CLINICAL DATA:  Hemoptysis  EXAM: PORTABLE CHEST - 1 VIEW  COMPARISON:  830 hr   FINDINGS: Heterogeneous bar predominately interstitial opacities are present throughout both lungs worrisome for diffuse pneumonia or edema. Mild cardiomegaly. No pneumothorax.  IMPRESSION: Findings most consistent with diffuse edema or pneumonia.   Electronically Signed   By: Maryclare Bean M.D.   On: 07/12/2014 14:33   Dg Bone Survey Met  07/12/2014   CLINICAL DATA:  Multiple myeloma  EXAM: METASTATIC BONE SURVEY  COMPARISON:  PET-CT dated 04/21/2014.  Bone survey dated 11/29/2013.  FINDINGS: 2.4 cm lytic lesion in the calvarium, grossly unchanged.  Degenerative changes of the cervical, thoracic, and lumbar spine. Stable postsurgical changes involving the lumbar  spine.  Expansile mixed sclerotic and lytic lesion involving the left iliac crest. 1.7 cm lytic lesion in the left inferior pubic ramus, unchanged.  Visualized bilateral upper and lower extremities are within normal limits.  Lungs are grossly clear.  Heart is normal in size.  IMPRESSION: Stable lytic lesions in the calvarium and left inferior pubic ramus.  Stable mixed sclerotic and lytic lesion in the left iliac crest.  No evidence of new lesions in the visualized axial and appendicular skeleton.   Electronically Signed   By: Julian Hy M.D.   On: 07/12/2014 09:45    Microbiology: Recent Results (from the past 240 hour(s))  URINE CULTURE     Status: None   Collection Time    07/10/14 11:04 PM      Result Value Ref Range Status   Specimen Description URINE, RANDOM   Final   Special Requests none   Final   Culture  Setup Time     Final   Value: 07/11/2014 01:20     Performed at [facility]     Final   Value: 4,000 COLONIES/ML     Performed at [facility]   Culture     Final   Value: INSIGNIFICANT GROWTH     Performed at [facility]   Report Status 07/12/2014 FINAL   Final  MRSA PCR SCREENING     Status: None   Collection Time    07/18/14  2:12 PM      Result Value Ref Range Status   MRSA  by PCR NEGATIVE  NEGATIVE Final   Comment:            The GeneXpert MRSA Assay (FDA     approved for NASAL specimens     only), is one component of a     comprehensive MRSA colonization     surveillance program. It is not     intended to diagnose MRSA     infection nor to guide or     monitor treatment for     MRSA infections.  URINE CULTURE     Status: None   Collection Time    07/18/14  3:08 PM      Result Value Ref Range Status   Specimen Description URINE, RANDOM   Final   Special Requests NONE   Final   Culture  Setup Time     Final   Value: 07/18/2014 22:32     Performed at [facility]     Final   Value: 20,OOO COLONIES/ML     Performed at [facility]   Culture     Final   Value: Multiple bacterial morphotypes present, none predominant. Suggest appropriate recollection if clinically indicated.     Performed at [facility]   Report Status 07/19/2014 FINAL   Final     Labs: Basic Metabolic Panel:  Recent Labs Lab 07/14/14 0550 07/15/14 0436 07/18/14 0957 07/18/14 1002 07/19/14 0350 07/20/14 0403  NA 133* 132*  --  138 139 137  K 4.2 3.8  --  3.3* 5.1 3.6*  CL 94* 92*  --  98 102 98  CO2 22 22  --  $R'22 19 20  'zt$ GLUCOSE 263* 318*  --  171* 182* 103*  BUN 91* 105*  --  133* 125* 115*  CREATININE 5.74* 5.55*  --  6.30* 6.09* 5.77*  CALCIUM 7.0* 6.9*  --  6.5* 6.5* 7.0*  MG  --   --  1.5  --   --   --   PHOS 5.1*  --   --   --   --   --    Liver Function Tests:  Recent Labs Lab 07/14/14 0550 07/18/14 1002 07/19/14 0350  AST  --  21 28  ALT  --  28 27  ALKPHOS  --  64 59  BILITOT  --  0.5 0.5  PROT  --  6.5 6.3  ALBUMIN 2.1* 2.0* 2.0*   No results found for this basename: LIPASE, AMYLASE,  in the last 168 hours No results found for this basename: AMMONIA,  in the last 168 hours CBC:  Recent Labs Lab 07/18/14 1002 07/18/14 2340 07/19/14 0350 07/19/14 0838 07/20/14 0403  WBC 3.6* 3.3* 3.6* 3.1* 3.1*   NEUTROABS 2.3  --   --   --   --   HGB 7.3* 8.6* 8.7* 8.8* 8.6*  HCT 21.2* 24.2* 24.6* 24.6* 24.3*  MCV 85.5 84.3 86.0 84.8 85.3  PLT <5* 5* 9* 7* 8*   Cardiac Enzymes: No results found for this basename: CKTOTAL, CKMB, CKMBINDEX, TROPONINI,  in the last 168 hours BNP: BNP (last 3 results) No results found for this basename: PROBNP,  in the last 8760 hours CBG:  Recent Labs Lab 07/18/14 1727 07/18/14 2140 07/19/14 0719 07/19/14 1130 07/19/14 1651  GLUCAP 162* 207* 169* 198* 156*    Time coordinating discharge: 35  minutes  Signed:  Asah Lamay  [facility] 07/20/2014, 8:04 AM

## 2014-07-21 ENCOUNTER — Encounter: Payer: Self-pay | Admitting: Hematology & Oncology

## 2014-07-21 LAB — CHROMOSOME ANALYSIS, BONE MARROW

## 2014-07-21 LAB — GLUCOSE, CAPILLARY
Glucose-Capillary: 227 mg/dL — ABNORMAL HIGH (ref 70–99)
Glucose-Capillary: 77 mg/dL (ref 70–99)

## 2014-07-21 LAB — TISSUE HYBRIDIZATION (BONE MARROW)-NCBH

## 2014-07-22 ENCOUNTER — Telehealth: Payer: Self-pay | Admitting: *Deleted

## 2014-07-22 ENCOUNTER — Other Ambulatory Visit: Payer: Self-pay | Admitting: *Deleted

## 2014-07-22 MED ORDER — AMINOCAPROIC ACID 500 MG PO TABS: 1000.0000 mg | ORAL_TABLET | Freq: Three times a day (TID) | ORAL | Status: DC

## 2014-07-22 NOTE — Telephone Encounter (Signed)
Received a call from patient wife as well as [facility] nurse Patti.  Patient has steady trickle of blood from nose, coughing up some blood on occasion and bruising.  Called Dr. Marin Olp. Ordered amicar for patient .  Rx sent to patient pharmacy

## 2014-07-23 ENCOUNTER — Other Ambulatory Visit: Payer: Self-pay | Admitting: *Deleted

## 2014-07-23 ENCOUNTER — Encounter: Payer: Self-pay | Admitting: *Deleted

## 2014-07-23 MED ORDER — AMINOCAPROIC ACID 500 MG PO TABS: 1000.0000 mg | ORAL_TABLET | Freq: Three times a day (TID) | ORAL | Status: DC

## 2014-07-23 NOTE — Progress Notes (Signed)
Dr. Marin Olp put patient on Amicar. [facility] had 100 tablets of generic Amicar which was affordable to patient.  Unfortunately the generic was discontinued.  Sent Amicar order down to [place] to initiate prior authorization proces for brand name amicar for patient.  Patient has 16 days of generic Amicar

## 2014-07-24 ENCOUNTER — Ambulatory Visit: Payer: Medicare Other

## 2014-07-24 ENCOUNTER — Ambulatory Visit (HOSPITAL_COMMUNITY)
Admission: RE | Admit: 2014-07-24 | Discharge: 2014-07-24 | Disposition: A | Discharge status: Home / Self Care | Payer: Medicare Other | Source: Ambulatory Visit | Attending: Hematology & Oncology | Admitting: Hematology & Oncology

## 2014-07-24 ENCOUNTER — Other Ambulatory Visit (HOSPITAL_BASED_OUTPATIENT_CLINIC_OR_DEPARTMENT_OTHER): Payer: Medicare Other | Admitting: Lab

## 2014-07-24 ENCOUNTER — Encounter: Payer: Self-pay | Admitting: Hematology & Oncology

## 2014-07-24 ENCOUNTER — Ambulatory Visit (HOSPITAL_BASED_OUTPATIENT_CLINIC_OR_DEPARTMENT_OTHER): Payer: Medicare Other | Admitting: Hematology & Oncology

## 2014-07-24 ENCOUNTER — Other Ambulatory Visit: Payer: Self-pay | Admitting: Family

## 2014-07-24 VITALS — BP 138/58 | HR 76 | Temp 97.6°F | Resp 20 | Ht 67.0 in | Wt 240.0 lb

## 2014-07-24 DIAGNOSIS — D46Z Other myelodysplastic syndromes: Secondary | ICD-10-CM

## 2014-07-24 DIAGNOSIS — C9000 Multiple myeloma not having achieved remission: Secondary | ICD-10-CM

## 2014-07-24 DIAGNOSIS — I152 Hypertension secondary to endocrine disorders: Secondary | ICD-10-CM

## 2014-07-24 DIAGNOSIS — E1159 Type 2 diabetes mellitus with other circulatory complications: Secondary | ICD-10-CM

## 2014-07-24 DIAGNOSIS — D469 Myelodysplastic syndrome, unspecified: Secondary | ICD-10-CM

## 2014-07-24 DIAGNOSIS — C9002 Multiple myeloma in relapse: Secondary | ICD-10-CM

## 2014-07-24 DIAGNOSIS — E0842 Diabetes mellitus due to underlying condition with diabetic polyneuropathy: Secondary | ICD-10-CM

## 2014-07-24 DIAGNOSIS — I10 Essential (primary) hypertension: Secondary | ICD-10-CM

## 2014-07-24 DIAGNOSIS — N189 Chronic kidney disease, unspecified: Secondary | ICD-10-CM

## 2014-07-24 DIAGNOSIS — E119 Type 2 diabetes mellitus without complications: Secondary | ICD-10-CM

## 2014-07-24 LAB — TECHNOLOGIST REVIEW CHCC SATELLITE

## 2014-07-24 LAB — CBC WITH DIFFERENTIAL (CANCER CENTER ONLY)
BASO#: 0 10*3/uL (ref 0.0–0.2)
BASO%: 1.1 % (ref 0.0–2.0)
EOS ABS: 0 10*3/uL (ref 0.0–0.5)
EOS%: 1.1 % (ref 0.0–7.0)
HCT: 22.6 % — ABNORMAL LOW (ref 38.7–49.9)
HEMOGLOBIN: 8 g/dL — AB (ref 13.0–17.1)
LYMPH#: 0.5 10*3/uL — AB (ref 0.9–3.3)
LYMPH%: 20.3 % (ref 14.0–48.0)
MCH: 30.5 pg (ref 28.0–33.4)
MCHC: 35.4 g/dL (ref 32.0–35.9)
MCV: 86 fL (ref 82–98)
MONO#: 0.3 10*3/uL (ref 0.1–0.9)
MONO%: 10.5 % (ref 0.0–13.0)
NEUT#: 1.8 10*3/uL (ref 1.5–6.5)
NEUT%: 67 % (ref 40.0–80.0)
Platelets: <6 10*3/uL — CL (ref 145–400)
RBC: 2.62 10*6/uL — AB (ref 4.20–5.70)
RDW: 12.7 % (ref 11.1–15.7)
WBC: 2.7 10*3/uL — ABNORMAL LOW (ref 4.0–10.0)

## 2014-07-24 LAB — HOLD TUBE, BLOOD BANK - CHCC SATELLITE

## 2014-07-24 LAB — CMP (CANCER CENTER ONLY)
ALT: 52 U/L — AB (ref 10–47)
AST: 29 U/L (ref 11–38)
Albumin: 2.4 g/dL — ABNORMAL LOW (ref 3.3–5.5)
Alkaline Phosphatase: 85 U/L — ABNORMAL HIGH (ref 26–84)
BUN, Bld: 98 mg/dL — ABNORMAL HIGH (ref 7–22)
CALCIUM: 7.4 mg/dL — AB (ref 8.0–10.3)
CHLORIDE: 99 meq/L (ref 98–108)
CO2: 22 meq/L (ref 18–33)
Creat: 5.6 mg/dl (ref 0.6–1.2)
Glucose, Bld: 116 mg/dL (ref 73–118)
Potassium: 3.2 mEq/L — ABNORMAL LOW (ref 3.3–4.7)
SODIUM: 136 meq/L (ref 128–145)
TOTAL PROTEIN: 6.9 g/dL (ref 6.4–8.1)
Total Bilirubin: 0.5 mg/dl (ref 0.20–1.60)

## 2014-07-24 LAB — CHCC SATELLITE - SMEAR

## 2014-07-24 MED ORDER — FAMCICLOVIR 500 MG PO TABS: 250.0000 mg | ORAL_TABLET | Freq: Every day | ORAL | Status: AC

## 2014-07-24 MED ORDER — PANTOPRAZOLE SODIUM 40 MG PO TBEC: 40.0000 mg | DELAYED_RELEASE_TABLET | Freq: Two times a day (BID) | ORAL | Status: AC

## 2014-07-24 NOTE — Progress Notes (Signed)
Hematology and Oncology Follow Up Visit  DARTANYON FRANKOWSKI [id number] March 02, 1941 73 y.o. 07/24/2014   Principle Diagnosis:   High-grade myelodysplasia  Chronic renal insufficiency  Insulin-dependent diabetes  Current Therapy:    Supportive care with transfusions     Interim History:  Mr.  Derusha is back for followup. Unfortunately, he clearly has myelodysplasia. Get a bone marrow test done at the hospital of weeks ago. The pathology report clearly shows that he has significant myelo dysplastic changes.  We went ahead and did chromosome studies. Not surprisingly, he has numerous chromosome abnormalities. 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  Again, he did not have any of these abnormalities 3 months ago when he had a bone marrow test done.  He is or is not responding well to platelets.  He's had nosebleeds. We have him on Amicar. Since starting Amicar, his platelets have worked a little bit better.  I talked to him at length about the situation. His son and daughter were with him. I told him that I did not think that he is going to make it more than 4-6 months. I think even if he tried treatment, his prognosis probably would be no more than 8 months.  I feel what treatment would be Vidaza. I would have to see about dosing for renal adjustment. I think this would probably only have a temperature chance of helping.  I really would not think that he is a dialysis candidate. I the with the prognosis from the myelodysplasia, we would be hard pressed to try to dialyze him.  I also talked to him about hospice. I the hospice would be a great idea for him. I the would help he and his family out. His wife has bad pulmonary fibrosis. She's on oxygen. She recently had radiation for a non-small cell lung cancer. Again, I told him that hospice is more than "end-of-life". I told him that hospice can see him for several months.  He will think about this. I talked to him about life support issues. I explained to him about end-of-life issues. I spent him about being on a ventilator. I think he would not do well try to be kept alive on machines. I'll that he would ever come off a ventilator if he were to go on. I figured he had CPR done to him, he probably would bleed internally because of his severe thrombocytopenia. I told him this. Again, he is going to think about his end-of-life decisions.  Currently, he says he feels pretty well. I think his overall performance status is ECOG 3.  Medications: Current outpatient prescriptions:aminocaproic acid (AMICAR) 500 MG tablet, Take 2 tablets (1,000 mg total) by mouth 3 (three) times daily., Disp: 180 tablet, Rfl: 2;  furosemide (LASIX) 40 MG tablet, Take 1 tablet (40 mg total) by mouth 2 (two) times daily., Disp: 30 tablet, Rfl: 0;  insulin glargine (LANTUS) 100 UNIT/ML injection, Inject 0.5 mLs (50 Units total) into the skin at bedtime., Disp: , Rfl:  metoprolol (LOPRESSOR) 50 MG tablet, Take 0.5 tablets (25 mg total) by mouth 2 (two) times daily., Disp: , Rfl: ;  ondansetron (ZOFRAN) 8 MG tablet, Take 8 mg by mouth every 8 (eight) hours as needed for nausea or vomiting (and before chemo)., Disp: , Rfl: ;  pantoprazole (PROTONIX) 40 MG tablet, Take 1 tablet (40 mg total) by mouth 2 (two) times daily before a meal., Disp: 60 tablet,  Rfl: 4 famciclovir (FAMVIR) 500 MG tablet, Take 0.5 tablets (250 mg total) by mouth daily., Disp: 30 tablet, Rfl: 4;  KLOR-CON M20 20 MEQ tablet, , Disp: , Rfl:   Allergies:  Allergies  Allergen Reactions  . Codeine     hallucinations  . Lorazepam [Lorazepam] Anxiety    Paradoxical agitation when given as pre med for bone marrow biopsy 09/11/06    Past Medical History, Surgical history, Social history, and Family History were reviewed and updated.  Review of Systems: As above  Physical Exam:  height is _0  (1.702 m) and weight is 240 lb (108.863  kg). His oral temperature is 97.6 F (36.4 C). His blood pressure is 138/58 and his pulse is 76. His respiration is 20 and oxygen saturation is 100%.   Elderly, chronically ill-appearing white gentleman. He has a lot of ecchymoses. His head and exam shows no ocular or oral lesions. There are no palpable cervical or subclavicular lymph nodes. Lungs are with decreased in the bases. Cardiac exam regular in rhythm. He has a 1/6 systolic ejection murmur. Abdomen soft. Has good bowel sounds. Slightly obese. He has no fluid wave. There is no palpable liver or spleen tip. Back exam no tenderness over the spine. There is shows chronic 2+ edema in his lower legs. Skin exam shows ecchymoses on his arms. Neurological exam shows no focal neurological deficits.  Lab Results  Component Value Date   WBC 2.7* 07/24/2014   HGB 8.0* 07/24/2014   HCT 22.6* 07/24/2014   MCV 86 07/24/2014   PLT <6* 07/24/2014     Chemistry      Component Value Date/Time   NA 136 07/24/2014 1306   NA 137 07/20/2014 0403   NA 145 01/10/2014 1436   K 3.2* 07/24/2014 1306   K 3.6* 07/20/2014 0403   K 3.7 01/10/2014 1436   CL 99 07/24/2014 1306   CL 98 07/20/2014 0403   CL 104 04/19/2013 1300   CO2 22 07/24/2014 1306   CO2 20 07/20/2014 0403   CO2 20* 01/10/2014 1436   BUN 98* 07/24/2014 1306   BUN 115* 07/20/2014 0403   BUN 40.3* 01/10/2014 1436   CREATININE 5.6* 07/24/2014 1306   CREATININE 5.77* 07/20/2014 0403   CREATININE 3.6* 01/10/2014 1436   CREATININE 1.90* 03/25/2011 1008      Component Value Date/Time   CALCIUM 7.4* 07/24/2014 1306   CALCIUM 7.0* 07/20/2014 0403   CALCIUM 8.1* 01/10/2014 1436   ALKPHOS 85* 07/24/2014 1306   ALKPHOS 59 07/19/2014 0350   ALKPHOS 65 01/10/2014 1436   AST 29 07/24/2014 1306   AST 28 07/19/2014 0350   AST 13 01/10/2014 1436   ALT 52* 07/24/2014 1306   ALT 27 07/19/2014 0350   ALT 12 01/10/2014 1436   BILITOT 0.50 07/24/2014 1306   BILITOT 0.5 07/19/2014 0350   BILITOT 0.23 01/10/2014 1436         Impression and  Plan: Mr. Speyer is 73 year old gentleman. He has a high-grade myelodysplasia. I think with his IPSS category, he would be considered high risk. I think his score is probably over 6.  Again we spent about an hour with him. I hope was talked to him about hospice. I was talking to him about end-of-life issues. He understands all this. He is going to think about this. I don't think that he is a dialysis candidate.  He's going to need a PICC line. He's going to need IV access. We'll try to set  the PICC line insertion for him next week at radiology.  We will transfuse him tomorrow. I think we can get an IV in the him. If not, then we'll have to wait for the transfusion until next week.  I answered all their questions.  We will have to follow his blood counts weekly.   Volanda Napoleon, MD 9/17/20153:25 PM

## 2014-07-25 ENCOUNTER — Ambulatory Visit (HOSPITAL_BASED_OUTPATIENT_CLINIC_OR_DEPARTMENT_OTHER): Payer: Medicare Other

## 2014-07-25 VITALS — BP 136/60 | HR 81 | Temp 97.4°F | Resp 20

## 2014-07-25 DIAGNOSIS — D46Z Other myelodysplastic syndromes: Secondary | ICD-10-CM | POA: Diagnosis not present

## 2014-07-25 DIAGNOSIS — C9002 Multiple myeloma in relapse: Secondary | ICD-10-CM

## 2014-07-25 DIAGNOSIS — C9000 Multiple myeloma not having achieved remission: Secondary | ICD-10-CM

## 2014-07-25 LAB — PREPARE RBC (CROSSMATCH)

## 2014-07-25 MED ORDER — ACETAMINOPHEN 325 MG PO TABS
ORAL_TABLET | ORAL | Status: AC
  Filled 2014-07-25: qty 2

## 2014-07-25 MED ORDER — ACETAMINOPHEN 325 MG PO TABS
650.0000 mg | ORAL_TABLET | Freq: Once | ORAL | Status: AC
Start: 2014-07-25 — End: 2014-07-25
  Administered 2014-07-25: 650 mg via ORAL

## 2014-07-25 MED ORDER — DIPHENHYDRAMINE HCL 25 MG PO CAPS
25.0000 mg | ORAL_CAPSULE | Freq: Once | ORAL | Status: AC
  Administered 2014-07-25: 25 mg via ORAL

## 2014-07-25 MED ORDER — SODIUM CHLORIDE 0.9 % IV SOLN
250.0000 mL | Freq: Once | INTRAVENOUS | Status: AC
  Administered 2014-07-25: 250 mL via INTRAVENOUS

## 2014-07-25 MED ORDER — DIPHENHYDRAMINE HCL 25 MG PO CAPS
ORAL_CAPSULE | ORAL | Status: AC
  Filled 2014-07-25: qty 1

## 2014-07-25 NOTE — Patient Instructions (Addendum)
Platelet Transfusion Information This is information about transfusions of platelets. Platelets are tiny cells made by the bone marrow and found in the blood. When a blood vessel is damaged, platelets rush to the damaged area to help form a clot. This begins the healing process. When platelets get very low, your blood may have trouble clotting. This may be from:  Illness.  Blood disorder.  Chemotherapy to treat cancer. Often, lower platelet counts do not cause problems.  Platelets usually last for 7 to 10 days. If they are not used in an injury, they are broken down by the liver or spleen. Symptoms of low platelet count include:  Nosebleeds.  Bleeding gums.  Heavy periods.  Bruising and tiny blood spots in the skin.  Pinpoint spots of bleeding (petechiae).  Larger bruises (purpura).  Bleeding can be more serious if it happens in the brain or bowel. Platelet transfusions are often used to keep the platelet count at an acceptable level. Serious bleeding due to low platelets is uncommon. RISKS AND COMPLICATIONS Severe side effects from platelet transfusions are uncommon. Minor reactions may include:  Itching.  Rashes.  High temperature and shivering. Medications are available to stop transfusion reactions. Let your health care provider know if you develop any of the above problems.  If you are having platelet transfusions frequently, they may get less effective. This is called becoming refractory to platelets. It is uncommon. This can happen from non-immune causes and immune causes. Non-immune causes include:  High temperatures.  Some medications.  An enlarged spleen. Immune causes happen when your body discovers the platelets are not your own and begins making antibodies against them. The antibodies kill the platelets quickly. Even with platelet transfusions, you may still notice problems with bleeding or bruising. Let your health care providers know about this. Other things  can be done to help if this happens.  BEFORE THE PROCEDURE   Your health care provider will check your platelet count regularly.  If the platelet count is too low, it may be necessary to have a platelet transfusion.  This is more important before certain procedures with a risk of bleeding, such as a spinal tap.  Platelet transfusion reduces the risk of bleeding during or after the procedure.  Except in emergencies, giving a transfusion requires a written consent. Before blood is taken from a donor, a complete history is taken to make sure the person has no history of previous diseases, nor engages in risky social behavior. Examples of this are intravenous drug use or sexual activity with multiple partners. This could lead to infected blood or blood products being used. This history is taken in spite of the extensive testing to make sure the blood is safe. All blood products transfused are tested to make sure it is a match for the person getting the blood. It is also checked for infections. Blood is the safest it has ever been. The risk of getting an infection is very low. PROCEDURE  The platelets are stored in small plastic bags that are kept at a low temperature.  Each bag is called a unit and sometimes two units are given. They are given through an intravenous line by drip infusion over about one-half hour.  Usually blood is collected from multiple people to get enough to transfuse.  Sometimes, the platelets are collected from a single person. This is done using a special machine that separates the platelets from the blood. The machine is called an apheresis machine. Platelets collected in this   way are called apheresed platelets. Apheresed platelets reduce the risk of becoming sensitive to the platelets. This lowers the chances of having a transfusion reaction.  As it only takes a short time to give the platelets, this treatment can be given in an outpatient department. Platelets can also be  given before or after other treatments. SEEK IMMEDIATE MEDICAL CARE IF: You have any of the following symptoms over the next 12 hours or several days:  Shaking chills.  Fever with a temperature greater than 102F (38.9C) develops.  Back pain or muscle pain.  People around you feel you are not acting correctly, or you are confused.  Blood in the urine or bowel movements, or bleeding from any place in your body.  Shortness of breath, or difficulty breathing.  Dizziness.  Fainting.  You break out in a rash or develop hives.  Decrease in the amount of urine you are putting out, or the urine turns a dark color or changes to pink, red, or brown.  A severe headache or stiff neck.  Bruising more easily. Document Released: 08/21/2007 Document Revised: 03/10/2014 Document Reviewed: 08/21/2007 [facility] Patient Information 2015 [place], [place]. This information is not intended to replace advice given to you by your health care provider. Make sure you discuss any questions you have with your health care provider.  Blood Transfusion  A blood transfusion replaces your blood or some of its parts. Blood is replaced when you have lost blood because of surgery, an accident, or for severe blood conditions like anemia. You can donate blood to be used on yourself if you have a planned surgery. If you lose blood during that surgery, your own blood can be given back to you. Any blood given to you is checked to make sure it matches your blood type. Your temperature, blood pressure, and heart rate (vital signs) will be checked often.  GET HELP RIGHT AWAY IF:   You feel sick to your stomach (nauseous) or throw up (vomit).  You have watery poop (diarrhea).  You have shortness of breath or trouble breathing.  You have blood in your pee (urine) or have dark colored pee.  You have chest pain or tightness.  Your eyes or skin turn yellow (jaundice).  You have a temperature by mouth above 102 F  (38.9 C), not controlled by medicine.  You start to shake and have chills.  You develop a a red rash (hives) or feel itchy.  You develop lightheadedness or feel confused.  You develop back, joint, or muscle pain.  You do not feel hungry (lost appetite).  You feel tired, restless, or nervous.  You develop belly (abdominal) cramps. Document Released: 01/20/2009 Document Revised: 01/16/2012 Document Reviewed: 01/20/2009 [facility] Patient Information 2015 [place], [place]. This information is not intended to replace advice given to you by your health care provider. Make sure you discuss any questions you have with your health care provider. Fall Prevention and Home Safety Falls cause injuries and can affect all age groups. It is possible to prevent falls.  HOW TO PREVENT FALLS  Wear shoes with rubber soles that do not have an opening for your toes.  Keep the inside and outside of your house well lit.  Use night lights throughout your home.  Remove clutter from floors.  Clean up floor spills.  Remove throw rugs or fasten them to the floor with carpet tape.  Do not place electrical cords across pathways.  Put grab bars by your tub, shower, and toilet. Do not  use towel bars as grab bars.  Put handrails on both sides of the stairway. Fix loose handrails.  Do not climb on stools or stepladders, if possible.  Do not wax your floors.  Repair uneven or unsafe sidewalks, walkways, or stairs.  Keep items you use a lot within reach.  Be aware of pets.  Keep emergency numbers next to the telephone.  Put smoke detectors in your home and near bedrooms. Ask your doctor what other things you can do to prevent falls. Document Released: 08/20/2009 Document Revised: 04/24/2012 Document Reviewed: 01/24/2012 [facility] Patient Information 2015 [place], [place]. This information is not intended to replace advice given to you by your health care provider. Make sure you discuss any questions  you have with your health care provider.

## 2014-07-26 LAB — PREPARE PLATELET PHERESIS: UNIT DIVISION: 0

## 2014-07-26 LAB — TYPE AND SCREEN
ABO/RH(D): O POS
Antibody Screen: NEGATIVE
Unit division: 0
Unit division: 0

## 2014-07-30 ENCOUNTER — Other Ambulatory Visit: Payer: Self-pay | Admitting: Hematology & Oncology

## 2014-07-30 ENCOUNTER — Ambulatory Visit (HOSPITAL_COMMUNITY)
Admission: RE | Admit: 2014-07-30 | Discharge: 2014-07-30 | Disposition: A | Discharge status: Home / Self Care | Payer: Medicare Other | Source: Ambulatory Visit | Attending: Hematology & Oncology | Admitting: Hematology & Oncology

## 2014-07-30 DIAGNOSIS — C9002 Multiple myeloma in relapse: Secondary | ICD-10-CM

## 2014-07-30 DIAGNOSIS — I998 Other disorder of circulatory system: Secondary | ICD-10-CM | POA: Diagnosis not present

## 2014-07-30 DIAGNOSIS — D46Z Other myelodysplastic syndromes: Secondary | ICD-10-CM

## 2014-07-30 MED ORDER — LIDOCAINE HCL 1 % IJ SOLN
INTRAMUSCULAR | Status: AC
  Filled 2014-07-30: qty 20

## 2014-07-30 MED ORDER — HEPARIN SOD (PORK) LOCK FLUSH 100 UNIT/ML IV SOLN
INTRAVENOUS | Status: AC
  Filled 2014-07-30: qty 5

## 2014-07-30 NOTE — Procedures (Signed)
Successful placement of dual lumen PICC line to rightbasilic vein. Length 42cm Tip at lower SVC/RA No complications Ready for use.  Ascencion Dike PA-C Interventional Radiology 07/30/2014 4:04 PM

## 2014-07-31 ENCOUNTER — Encounter: Payer: Self-pay | Admitting: Family

## 2014-07-31 ENCOUNTER — Ambulatory Visit (HOSPITAL_BASED_OUTPATIENT_CLINIC_OR_DEPARTMENT_OTHER): Payer: Medicare Other | Admitting: Family

## 2014-07-31 ENCOUNTER — Encounter: Payer: Self-pay | Admitting: Hematology & Oncology

## 2014-07-31 ENCOUNTER — Ambulatory Visit: Payer: Medicare Other

## 2014-07-31 ENCOUNTER — Ambulatory Visit (HOSPITAL_BASED_OUTPATIENT_CLINIC_OR_DEPARTMENT_OTHER): Payer: Medicare Other | Admitting: Lab

## 2014-07-31 VITALS — BP 131/50 | HR 70 | Temp 97.7°F | Resp 20

## 2014-07-31 DIAGNOSIS — D46Z Other myelodysplastic syndromes: Secondary | ICD-10-CM | POA: Diagnosis not present

## 2014-07-31 DIAGNOSIS — C9000 Multiple myeloma not having achieved remission: Secondary | ICD-10-CM

## 2014-07-31 LAB — CBC WITH DIFFERENTIAL (CANCER CENTER ONLY)
BASO#: 0 10*3/uL (ref 0.0–0.2)
BASO%: 1.2 % (ref 0.0–2.0)
EOS%: 1.2 % (ref 0.0–7.0)
Eosinophils Absolute: 0 10*3/uL (ref 0.0–0.5)
HCT: 22.8 % — ABNORMAL LOW (ref 38.7–49.9)
HGB: 8 g/dL — ABNORMAL LOW (ref 13.0–17.1)
LYMPH#: 0.4 10*3/uL — AB (ref 0.9–3.3)
LYMPH%: 25.1 % (ref 14.0–48.0)
MCH: 29.9 pg (ref 28.0–33.4)
MCHC: 35.1 g/dL (ref 32.0–35.9)
MCV: 85 fL (ref 82–98)
MONO#: 0.3 10*3/uL (ref 0.1–0.9)
MONO%: 17.5 % — ABNORMAL HIGH (ref 0.0–13.0)
NEUT%: 55 % (ref 40.0–80.0)
NEUTROS ABS: 0.9 10*3/uL — AB (ref 1.5–6.5)
Platelets: <6 10*3/uL — CL (ref 145–400)
RBC: 2.68 10*6/uL — ABNORMAL LOW (ref 4.20–5.70)
RDW: 13.1 % (ref 11.1–15.7)
WBC: 1.7 10*3/uL — ABNORMAL LOW (ref 4.0–10.0)

## 2014-07-31 LAB — TECHNOLOGIST REVIEW CHCC SATELLITE

## 2014-07-31 MED ORDER — HEPARIN SOD (PORK) LOCK FLUSH 100 UNIT/ML IV SOLN
250.0000 [IU] | INTRAVENOUS | Status: AC | PRN
  Administered 2014-07-31: 250 [IU]
  Filled 2014-07-31: qty 5

## 2014-07-31 MED ORDER — SODIUM CHLORIDE 0.9 % IJ SOLN
10.0000 mL | INTRAMUSCULAR | Status: AC | PRN
  Administered 2014-07-31: 10 mL
  Filled 2014-07-31: qty 10

## 2014-07-31 NOTE — Patient Instructions (Signed)
PICC Home Guide A peripherally inserted central catheter (PICC) is a long, thin, flexible tube that is inserted into a vein in the upper arm. It is a form of intravenous (IV) access. It is considered to be a "central" line because the tip of the PICC ends in a large vein in your chest. This large vein is called the superior vena cava (SVC). The PICC tip ends in the SVC because there is a lot of blood flow in the SVC. This allows medicines and IV fluids to be quickly distributed throughout the body. The PICC is inserted using a sterile technique by a specially trained nurse or physician. After the PICC is inserted, a chest X-ray exam is done to be sure it is in the correct place.  A PICC may be placed for different reasons, such as:  To give medicines and liquid nutrition that can only be given through a central line. Examples are:  Certain antibiotic treatments.  Chemotherapy.  Total parenteral nutrition (TPN).  To take frequent blood samples.  To give IV fluids and blood products.  If there is difficulty placing a peripheral intravenous (PIV) catheter. If taken care of properly, a PICC can remain in place for several months. A PICC can also allow a person to go home from the hospital early. Medicine and PICC care can be managed at home by a family member or home health care team. WHAT PROBLEMS CAN HAPPEN WHEN I HAVE A PICC? Problems with a PICC can occasionally occur. These may include the following:  A blood clot (thrombus) forming in or at the tip of the PICC. This can cause the PICC to become clogged. A clot-dissolving medicine called tissue plasminogen activator (tPA) can be given through the PICC to help break up the clot.  Inflammation of the vein (phlebitis) in which the PICC is placed. Signs of inflammation may include redness, pain at the insertion site, red streaks, or being able to feel a "cord" in the vein where the PICC is located.  Infection in the PICC or at the insertion  site. Signs of infection may include fever, chills, redness, swelling, or pus drainage from the PICC insertion site.  PICC movement (malposition). The PICC tip may move from its original position due to excessive physical activity, forceful coughing, sneezing, or vomiting.  A break or cut in the PICC. It is important to not use scissors near the PICC.  Nerve or tendon irritation or injury during PICC insertion. WHAT SHOULD I KEEP IN MIND ABOUT ACTIVITIES WHEN I HAVE A PICC?  You may bend your arm and move it freely. If your PICC is near or at the bend of your elbow, avoid activity with repeated motion at the elbow.  Rest at home for the remainder of the day following PICC line insertion.  Avoid lifting heavy objects as instructed by your health care provider.  Avoid using a crutch with the arm on the same side as your PICC. You may need to use a walker. WHAT SHOULD I KNOW ABOUT MY PICC DRESSING?  Keep your PICC bandage (dressing) clean and dry to prevent infection.  Ask your health care provider when you may shower. Ask your health care provider to teach you how to wrap the PICC when you do take a shower.  Change the PICC dressing as instructed by your health care provider.  Change your PICC dressing if it becomes loose or wet. WHAT SHOULD I KNOW ABOUT PICC CARE?  Check the PICC insertion site   daily for leakage, redness, swelling, or pain.  Do not take a bath, swim, or use hot tubs when you have a PICC. Cover PICC line with clear plastic wrap and tape to keep it dry while showering.  Flush the PICC as directed by your health care provider. Let your health care provider know right away if the PICC is difficult to flush or does not flush. Do not use force to flush the PICC.  Do not use a syringe that is less than 10 mL to flush the PICC.  Never pull or tug on the PICC.  Avoid blood pressure checks on the arm with the PICC.  Keep your PICC identification card with you at all  times.  Do not take the PICC out yourself. Only a trained clinical professional should remove the PICC. SEEK IMMEDIATE MEDICAL CARE IF:  Your PICC is accidentally pulled all the way out. If this happens, cover the insertion site with a bandage or gauze dressing. Do not throw the PICC away. Your health care provider will need to inspect it.  Your PICC was tugged or pulled and has partially come out. Do not  push the PICC back in.  There is any type of drainage, redness, or swelling where the PICC enters the skin.  You cannot flush the PICC, it is difficult to flush, or the PICC leaks around the insertion site when it is flushed.  You hear a "flushing" sound when the PICC is flushed.  You have pain, discomfort, or numbness in your arm, shoulder, or jaw on the same side as the PICC.  You feel your heart "racing" or skipping beats.  You notice a hole or tear in the PICC.  You develop chills or a fever. MAKE SURE YOU:   Understand these instructions.  Will watch your condition.  Will get help right away if you are not doing well or get worse. Document Released: 04/30/2003 Document Revised: 03/10/2014 Document Reviewed: 07/01/2013 [employer] Patient Information 2015 [employer]. This information is not intended to replace advice given to you by your health care provider. Make sure you discuss any questions you have with your health care provider.  

## 2014-07-31 NOTE — Progress Notes (Signed)
[place]  [phone] Fax:(336) [phone]  ID: William Gaines OB: [birth year] MR#: [id number] Patient Care Team: Jeni Salles, MD as PCP - General (Unknown Physician Specialty) Nadine Counts (Internal Medicine)  DIAGNOSIS:  High-grade myelodysplasia  Chronic renal insufficiency  Insulin-dependent diabetes  INTERVAL HISTORY: William Gaines is here today for a followup. His recent bone marrow biopsy showed that he has significant myelo dysplastic changes. His chromosome studies numerous chromosome abnormalities. 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 These abnormalities are new since his bone marrow biopsy 3 months ago. Despite transfusions his platelets remain <6 and Hgb 8.0. His nose bleeds have improved with the Amicar. Unfortunately the generic form is no longer available and we will have to go with the name brand which is more expensive. I spoke with him about hospice. He says that he is not ready for that yet that he is doing ok right now. I also asked him about changing his code status and he is not ready to do that at this time He is on oxygen 24 hours a day. He denies fever, chills, n/v, cough, rash, headaches, dizziness, SOB, chest pain, palpitations, abdominal pain, constipation, diarrhea, blood in urine or stool. He has had no bleeding or pain. He has had no swelling, tenderness, numbness or tingling. His appetite is good and he is drinking plenty of fluids. His PICC line looks good and was flushed today.    CURRENT TREATMENT: Supportive care with transfusions  REVIEW OF SYSTEMS: All other 10 point review of systems is negative.   PAST MEDICAL HISTORY: Past Medical History  Diagnosis Date  . Diabetes mellitus   . Hypertension   . Nephrotic syndrome in diabetes mellitus   . Fever, unspecified 10/04/2011  . Chronic renal insufficiency, stage III (moderate)    diabetes, Hypertension, nephrotic syndrome  . Gout   . Cellulitis and abscess of leg     diabetic  . Hernia of abdominal wall     ombelicus  . Gout 10/04/2011  . Fracture, vertebra, pathologic 12/23/2011  . Diabetic peripheral neuropathy 04/20/2012  . Multiple myeloma, in relapse   . Multiple myeloma, in relapse 10/04/2011   PAST SURGICAL HISTORY: Past Surgical History  Procedure Laterality Date  . Back surgery  2007 approx.    due to multiple myeloma   FAMILY HISTORY Family History  Problem Relation Age of Onset  . Hypertension Mother   . Diabetes Mother   . Cancer Brother   . Diabetes Son    GYNECOLOGIC HISTORY:  No LMP for male patient.   SOCIAL HISTORY:  History   Social History  . Marital Status: Married    Spouse Name: N/A    Number of Children: N/A  . Years of Education: N/A   Occupational History  . Not on file.   Social History Main Topics  . Smoking status: Former Smoker -- 1.00 packs/day for 42 years    Types: Cigarettes    Start date: 08/21/1954    Quit date: 10/03/1996  . Smokeless tobacco: Current User    Types: Chew     Comment: quit smoking 17years ago  . Alcohol Use: No  . Drug Use: No     Comment: quit approx. 15 years  . Sexual Activity: Not Currently   Other Topics Concern  . Not on file   Social History Narrative  . No narrative on file  ADVANCED DIRECTIVES: <no information>  HEALTH MAINTENANCE: History  Substance Use Topics  . Smoking status: Former Smoker -- 1.00 packs/day for 42 years    Types: Cigarettes    Start date: 08/21/1954    Quit date: 10/03/1996  . Smokeless tobacco: Current User    Types: Chew     Comment: quit smoking 17years ago  . Alcohol Use: No   Colonoscopy: PAP: Bone density: Lipid panel:  Allergies  Allergen Reactions  . Codeine     hallucinations  . Lorazepam [Lorazepam] Anxiety    Paradoxical agitation when given as pre med for bone marrow biopsy 09/11/06    Current Outpatient  Prescriptions  Medication Sig Dispense Refill  . aminocaproic acid (AMICAR) 500 MG tablet Take 2 tablets (1,000 mg total) by mouth 3 (three) times daily.  180 tablet  2  . famciclovir (FAMVIR) 500 MG tablet Take 0.5 tablets (250 mg total) by mouth daily.  30 tablet  4  . furosemide (LASIX) 40 MG tablet Take 1 tablet (40 mg total) by mouth 2 (two) times daily.  30 tablet  0  . insulin glargine (LANTUS) 100 UNIT/ML injection Inject 0.5 mLs (50 Units total) into the skin at bedtime.      . metoprolol (LOPRESSOR) 50 MG tablet Take 0.5 tablets (25 mg total) by mouth 2 (two) times daily.      . ondansetron (ZOFRAN) 8 MG tablet Take 8 mg by mouth every 8 (eight) hours as needed for nausea or vomiting (and before chemo).      . pantoprazole (PROTONIX) 40 MG tablet Take 1 tablet (40 mg total) by mouth 2 (two) times daily before a meal.  60 tablet  4   No current facility-administered medications for this visit.   Facility-Administered Medications Ordered in Other Visits  Medication Dose Route Frequency Provider Last Rate Last Dose  . heparin lock flush 100 unit/mL  250 Units Intracatheter PRN Josph Macho, MD      . heparin lock flush 100 unit/mL  250 Units Intracatheter PRN Josph Macho, MD      . sodium chloride 0.9 % injection 10 mL  10 mL Intracatheter PRN Josph Macho, MD      . sodium chloride 0.9 % injection 10 mL  10 mL Intracatheter PRN Josph Macho, MD       OBJECTIVE: Filed Vitals:   07/31/14 1135  BP: 131/50  Pulse: 70  Temp: 97.7 F (36.5 C)  Resp: 20   Body mass index is 0.00 kg/(m^2). ECOG FS:0 - Asymptomatic Ocular: Sclerae unicteric, pupils equal, round and reactive to light Ear-nose-throat: Oropharynx clear, dentition fair Lymphatic: No cervical or supraclavicular adenopathy Lungs no rales or rhonchi, good excursion bilaterally Heart regular rate and rhythm, no murmur appreciated Abd soft, nontender, positive bowel sounds MSK no focal spinal tenderness, no joint  edema Neuro: non-focal, well-oriented, appropriate affect  LAB RESULTS: CMP     Component Value Date/Time   NA 136 07/24/2014 1306   NA 137 07/20/2014 0403   NA 145 01/10/2014 1436   K 3.2* 07/24/2014 1306   K 3.6* 07/20/2014 0403   K 3.7 01/10/2014 1436   CL 99 07/24/2014 1306   CL 98 07/20/2014 0403   CL 104 04/19/2013 1300   CO2 22 07/24/2014 1306   CO2 20 07/20/2014 0403   CO2 20* 01/10/2014 1436   GLUCOSE 116 07/24/2014 1306   GLUCOSE 103* 07/20/2014 0403   GLUCOSE 81 01/10/2014 1436   GLUCOSE 205*  04/19/2013 1300   BUN 98* 07/24/2014 1306   BUN 115* 07/20/2014 0403   BUN 40.3* 01/10/2014 1436   CREATININE 5.6* 07/24/2014 1306   CREATININE 5.77* 07/20/2014 0403   CREATININE 3.6* 01/10/2014 1436   CREATININE 1.90* 03/25/2011 1008   CALCIUM 7.4* 07/24/2014 1306   CALCIUM 7.0* 07/20/2014 0403   CALCIUM 8.1* 01/10/2014 1436   PROT 6.9 07/24/2014 1306   PROT 6.3 07/19/2014 0350   PROT 6.6 01/10/2014 1436   ALBUMIN 2.0* 07/19/2014 0350   ALBUMIN 2.2* 01/10/2014 1436   AST 29 07/24/2014 1306   AST 28 07/19/2014 0350   AST 13 01/10/2014 1436   ALT 52* 07/24/2014 1306   ALT 27 07/19/2014 0350   ALT 12 01/10/2014 1436   ALKPHOS 85* 07/24/2014 1306   ALKPHOS 59 07/19/2014 0350   ALKPHOS 65 01/10/2014 1436   BILITOT 0.50 07/24/2014 1306   BILITOT 0.5 07/19/2014 0350   BILITOT 0.23 01/10/2014 1436   GFRNONAA 9* 07/20/2014 0403   GFRAA 10* 07/20/2014 0403   No results found for this basename: SPEP, UPEP,  kappa and lambda light chains   Lab Results  Component Value Date   WBC 1.7* 07/31/2014   NEUTROABS 0.9* 07/31/2014   HGB 8.0* 07/31/2014   HCT 22.8* 07/31/2014   MCV 85 07/31/2014   PLT <6* 07/31/2014   No results found for this basename: LABCA2   No components found with this basename: [id number]   No results found for this basename: INR,  in the last 168 hours  STUDIES:  ASSESSMENT/PLAN: Mr. Sugg is 73 year old gentleman. He has a high-grade myelodysplasia. With his IPSS category, he would be considered high risk.  I think his score is probably over 6.  I spoke with him about hospice and changing code status. He is not ready for either at this time.  His nose bleeds have stopped. We will continue with the Amicar.  PICC was flushed today.  We will continue to check his labs weekly.  We will see him back in 2 weeks for a follow-up and labs.  His Hgb is 8 and platelets <6 despite receiving 2 units PRBCs last week. After speaking with Dr. Marin Olp concerning this we will not transfuse him at this time. We will see what the rest of his labs show.  He knows to call here with any questions or concerns and to go to the ED in the event of an emergency. We can certainly see him sooner if need be.   Eliezer Bottom, NP 07/31/2014 12:14 PM

## 2014-08-01 LAB — FERRITIN CHCC: Ferritin: 982 ng/ml — ABNORMAL HIGH (ref 22–316)

## 2014-08-04 ENCOUNTER — Other Ambulatory Visit: Payer: Medicare Other | Admitting: Lab

## 2014-08-04 ENCOUNTER — Encounter: Payer: Self-pay | Admitting: Nurse Practitioner

## 2014-08-04 ENCOUNTER — Other Ambulatory Visit: Payer: Self-pay | Admitting: Nurse Practitioner

## 2014-08-04 ENCOUNTER — Ambulatory Visit: Payer: Medicare Other | Admitting: Hematology & Oncology

## 2014-08-04 LAB — PROTEIN ELECTROPHORESIS, SERUM, WITH REFLEX
Albumin ELP: 38.2 % — ABNORMAL LOW (ref 55.8–66.1)
Alpha-1-Globulin: 6.9 % — ABNORMAL HIGH (ref 2.9–4.9)
Alpha-2-Globulin: 11.6 % (ref 7.1–11.8)
Beta 2: 9.4 % — ABNORMAL HIGH (ref 3.2–6.5)
Beta Globulin: 7.1 % (ref 4.7–7.2)
Gamma Globulin: 26.8 % — ABNORMAL HIGH (ref 11.1–18.8)
Total Protein, Serum Electrophoresis: 5.8 g/dL — ABNORMAL LOW (ref 6.0–8.3)

## 2014-08-04 LAB — COMPREHENSIVE METABOLIC PANEL
ALT: 28 U/L (ref 0–53)
AST: 20 U/L (ref 0–37)
Albumin: 2.5 g/dL — ABNORMAL LOW (ref 3.5–5.2)
Alkaline Phosphatase: 76 U/L (ref 39–117)
BUN: 77 mg/dL — ABNORMAL HIGH (ref 6–23)
CO2: 17 mEq/L — ABNORMAL LOW (ref 19–32)
Calcium: 7 mg/dL — ABNORMAL LOW (ref 8.4–10.5)
Chloride: 108 mEq/L (ref 96–112)
Creatinine, Ser: 5.89 mg/dL — ABNORMAL HIGH (ref 0.50–1.35)
Glucose, Bld: 100 mg/dL — ABNORMAL HIGH (ref 70–99)
Potassium: 3.3 mEq/L — ABNORMAL LOW (ref 3.5–5.3)
Sodium: 137 mEq/L (ref 135–145)
Total Bilirubin: 0.3 mg/dL (ref 0.2–1.2)
Total Protein: 5.8 g/dL — ABNORMAL LOW (ref 6.0–8.3)

## 2014-08-04 LAB — IGG, IGA, IGM
IgA: 565 mg/dL — ABNORMAL HIGH (ref 68–379)
IgG (Immunoglobin G), Serum: 1490 mg/dL (ref 650–1600)
IgM, Serum: 40 mg/dL — ABNORMAL LOW (ref 41–251)

## 2014-08-04 LAB — KAPPA/LAMBDA LIGHT CHAINS
Kappa free light chain: 24 mg/dL — ABNORMAL HIGH (ref 0.33–1.94)
Kappa:Lambda Ratio: 1.74 — ABNORMAL HIGH (ref 0.26–1.65)
Lambda Free Lght Chn: 13.8 mg/dL — ABNORMAL HIGH (ref 0.57–2.63)

## 2014-08-04 LAB — IFE INTERPRETATION

## 2014-08-04 LAB — LACTATE DEHYDROGENASE: LDH: 196 U/L (ref 94–250)

## 2014-08-04 MED ORDER — AMINOCAPROIC ACID 500 MG PO TABS: 1000.0000 mg | ORAL_TABLET | Freq: Three times a day (TID) | ORAL | Status: AC

## 2014-08-04 NOTE — Progress Notes (Signed)
Spoke w/Pattie [phone] at [facility] they will flush his PICC during home care services 3xweekly.

## 2014-08-07 ENCOUNTER — Ambulatory Visit: Payer: Medicare Other

## 2014-08-07 ENCOUNTER — Ambulatory Visit (HOSPITAL_BASED_OUTPATIENT_CLINIC_OR_DEPARTMENT_OTHER): Payer: Medicare Other | Admitting: Lab

## 2014-08-07 VITALS — BP 120/51 | HR 81 | Temp 98.0°F | Resp 16

## 2014-08-07 DIAGNOSIS — C9000 Multiple myeloma not having achieved remission: Secondary | ICD-10-CM

## 2014-08-07 DIAGNOSIS — D46Z Other myelodysplastic syndromes: Secondary | ICD-10-CM

## 2014-08-07 LAB — CMP (CANCER CENTER ONLY)
ALT(SGPT): 24 U/L (ref 10–47)
AST: 18 U/L (ref 11–38)
Albumin: 2.2 g/dL — ABNORMAL LOW (ref 3.3–5.5)
Alkaline Phosphatase: 57 U/L (ref 26–84)
BUN: 85 mg/dL — AB (ref 7–22)
CALCIUM: 7.4 mg/dL — AB (ref 8.0–10.3)
CHLORIDE: 111 meq/L — AB (ref 98–108)
CO2: 19 meq/L (ref 18–33)
CREATININE: 5.9 mg/dL — AB (ref 0.6–1.2)
Glucose, Bld: 113 mg/dL (ref 73–118)
Potassium: 3.1 mEq/L — ABNORMAL LOW (ref 3.3–4.7)
Sodium: 139 mEq/L (ref 128–145)
Total Bilirubin: 0.4 mg/dl (ref 0.20–1.60)
Total Protein: 6.5 g/dL (ref 6.4–8.1)

## 2014-08-07 LAB — CBC WITH DIFFERENTIAL (CANCER CENTER ONLY)
BASO#: 0 10*3/uL (ref 0.0–0.2)
BASO%: 1.7 % (ref 0.0–2.0)
EOS ABS: 0.1 10*3/uL (ref 0.0–0.5)
EOS%: 2.8 % (ref 0.0–7.0)
HCT: 18.2 % — ABNORMAL LOW (ref 38.7–49.9)
HGB: 6.5 g/dL — CL (ref 13.0–17.1)
LYMPH#: 0.4 10*3/uL — AB (ref 0.9–3.3)
LYMPH%: 24.4 % (ref 14.0–48.0)
MCH: 30.2 pg (ref 28.0–33.4)
MCHC: 35.7 g/dL (ref 32.0–35.9)
MCV: 85 fL (ref 82–98)
MONO#: 0.3 10*3/uL (ref 0.1–0.9)
MONO%: 18.3 % — ABNORMAL HIGH (ref 0.0–13.0)
NEUT%: 52.8 % (ref 40.0–80.0)
NEUTROS ABS: 1 10*3/uL — AB (ref 1.5–6.5)
RBC: 2.15 10*6/uL — AB (ref 4.20–5.70)
RDW: 13 % (ref 11.1–15.7)
WBC: 1.8 10*3/uL — ABNORMAL LOW (ref 4.0–10.0)

## 2014-08-07 LAB — TECHNOLOGIST REVIEW CHCC SATELLITE

## 2014-08-07 MED ORDER — HEPARIN SOD (PORK) LOCK FLUSH 100 UNIT/ML IV SOLN
500.0000 [IU] | Freq: Once | INTRAVENOUS | Status: AC
  Administered 2014-08-07: 500 [IU] via INTRAVENOUS
  Filled 2014-08-07: qty 5

## 2014-08-07 MED ORDER — SODIUM CHLORIDE 0.9 % IJ SOLN
10.0000 mL | INTRAMUSCULAR | Status: DC | PRN
  Administered 2014-08-07: 10 mL via INTRAVENOUS
  Filled 2014-08-07: qty 10

## 2014-08-07 NOTE — Patient Instructions (Signed)
PICC Home Guide A peripherally inserted central catheter (PICC) is a long, thin, flexible tube that is inserted into a vein in the upper arm. It is a form of intravenous (IV) access. It is considered to be a "central" line because the tip of the PICC ends in a large vein in your chest. This large vein is called the superior vena cava (SVC). The PICC tip ends in the SVC because there is a lot of blood flow in the SVC. This allows medicines and IV fluids to be quickly distributed throughout the body. The PICC is inserted using a sterile technique by a specially trained nurse or physician. After the PICC is inserted, a chest X-ray exam is done to be sure it is in the correct place.  A PICC may be placed for different reasons, such as:  To give medicines and liquid nutrition that can only be given through a central line. Examples are:  Certain antibiotic treatments.  Chemotherapy.  Total parenteral nutrition (TPN).  To take frequent blood samples.  To give IV fluids and blood products.  If there is difficulty placing a peripheral intravenous (PIV) catheter. If taken care of properly, a PICC can remain in place for several months. A PICC can also allow a person to go home from the hospital early. Medicine and PICC care can be managed at home by a family member or home health care team. WHAT PROBLEMS CAN HAPPEN WHEN I HAVE A PICC? Problems with a PICC can occasionally occur. These may include the following:  A blood clot (thrombus) forming in or at the tip of the PICC. This can cause the PICC to become clogged. A clot-dissolving medicine called tissue plasminogen activator (tPA) can be given through the PICC to help break up the clot.  Inflammation of the vein (phlebitis) in which the PICC is placed. Signs of inflammation may include redness, pain at the insertion site, red streaks, or being able to feel a "cord" in the vein where the PICC is located.  Infection in the PICC or at the insertion  site. Signs of infection may include fever, chills, redness, swelling, or pus drainage from the PICC insertion site.  PICC movement (malposition). The PICC tip may move from its original position due to excessive physical activity, forceful coughing, sneezing, or vomiting.  A break or cut in the PICC. It is important to not use scissors near the PICC.  Nerve or tendon irritation or injury during PICC insertion. WHAT SHOULD I KEEP IN MIND ABOUT ACTIVITIES WHEN I HAVE A PICC?  You may bend your arm and move it freely. If your PICC is near or at the bend of your elbow, avoid activity with repeated motion at the elbow.  Rest at home for the remainder of the day following PICC line insertion.  Avoid lifting heavy objects as instructed by your health care provider.  Avoid using a crutch with the arm on the same side as your PICC. You may need to use a walker. WHAT SHOULD I KNOW ABOUT MY PICC DRESSING?  Keep your PICC bandage (dressing) clean and dry to prevent infection.  Ask your health care provider when you may shower. Ask your health care provider to teach you how to wrap the PICC when you do take a shower.  Change the PICC dressing as instructed by your health care provider.  Change your PICC dressing if it becomes loose or wet. WHAT SHOULD I KNOW ABOUT PICC CARE?  Check the PICC insertion site   daily for leakage, redness, swelling, or pain.  Do not take a bath, swim, or use hot tubs when you have a PICC. Cover PICC line with clear plastic wrap and tape to keep it dry while showering.  Flush the PICC as directed by your health care provider. Let your health care provider know right away if the PICC is difficult to flush or does not flush. Do not use force to flush the PICC.  Do not use a syringe that is less than 10 mL to flush the PICC.  Never pull or tug on the PICC.  Avoid blood pressure checks on the arm with the PICC.  Keep your PICC identification card with you at all  times.  Do not take the PICC out yourself. Only a trained clinical professional should remove the PICC. SEEK IMMEDIATE MEDICAL CARE IF:  Your PICC is accidentally pulled all the way out. If this happens, cover the insertion site with a bandage or gauze dressing. Do not throw the PICC away. Your health care provider will need to inspect it.  Your PICC was tugged or pulled and has partially come out. Do not  push the PICC back in.  There is any type of drainage, redness, or swelling where the PICC enters the skin.  You cannot flush the PICC, it is difficult to flush, or the PICC leaks around the insertion site when it is flushed.  You hear a "flushing" sound when the PICC is flushed.  You have pain, discomfort, or numbness in your arm, shoulder, or jaw on the same side as the PICC.  You feel your heart "racing" or skipping beats.  You notice a hole or tear in the PICC.  You develop chills or a fever. MAKE SURE YOU:   Understand these instructions.  Will watch your condition.  Will get help right away if you are not doing well or get worse. Document Released: 04/30/2003 Document Revised: 03/10/2014 Document Reviewed: 07/01/2013 [employer] Patient Information 2015 [employer]. This information is not intended to replace advice given to you by your health care provider. Make sure you discuss any questions you have with your health care provider.  

## 2014-08-08 ENCOUNTER — Encounter (HOSPITAL_COMMUNITY): Payer: Self-pay

## 2014-08-08 ENCOUNTER — Telehealth: Payer: Self-pay | Admitting: *Deleted

## 2014-08-08 NOTE — Telephone Encounter (Signed)
Called patient wife and told her that we will repeat labwork next week,.  Patient is to let us know if he has any bleeding per dr. Marin Olp.

## 2014-08-11 LAB — PROTEIN ELECTROPHORESIS, SERUM, WITH REFLEX
ALBUMIN ELP: 40 % — AB (ref 55.8–66.1)
ALPHA-1-GLOBULIN: 6.7 % — AB (ref 2.9–4.9)
Alpha-2-Globulin: 11.5 % (ref 7.1–11.8)
BETA 2: 8.9 % — AB (ref 3.2–6.5)
BETA GLOBULIN: 6.8 % (ref 4.7–7.2)
Gamma Globulin: 26.1 % — ABNORMAL HIGH (ref 11.1–18.8)
Total Protein, Serum Electrophoresis: 5.8 g/dL — ABNORMAL LOW (ref 6.0–8.3)

## 2014-08-11 LAB — IGG, IGA, IGM
IgA: 553 mg/dL — ABNORMAL HIGH (ref 68–379)
IgG (Immunoglobin G), Serum: 1410 mg/dL (ref 650–1600)
IgM, Serum: 34 mg/dL — ABNORMAL LOW (ref 41–251)

## 2014-08-11 LAB — IFE INTERPRETATION

## 2014-08-12 ENCOUNTER — Other Ambulatory Visit: Payer: Self-pay | Admitting: Nurse Practitioner

## 2014-08-12 MED ORDER — OXYCODONE HCL 20 MG/ML PO CONC: ORAL | Status: AC

## 2014-08-12 NOTE — Telephone Encounter (Signed)
Spoke w/nurse and family regarding ear bleeding. Instructed they may clean gently with saline and a soft guaze or towel. Also pt is having some ear pain per nurse. Rx given per Dr. Marin Olp for Oxyfast. Per Patty, [facility], RN pt is complaining of pain 8/10 to her and it is at his L ear. VS has been stable at 104/54 HR 95. Pt is scheduled for an office visit on 10/8 and wife verbalized understanding.

## 2014-08-14 ENCOUNTER — Telehealth: Payer: Self-pay | Admitting: Hematology & Oncology

## 2014-08-14 ENCOUNTER — Other Ambulatory Visit: Payer: Medicare Other | Admitting: Lab

## 2014-08-14 ENCOUNTER — Ambulatory Visit: Payer: Medicare Other | Admitting: Hematology & Oncology

## 2014-08-14 NOTE — Telephone Encounter (Signed)
Patient's wife called and cx 08/14/14 apt and due to patient being sick and in the hospital.  She stated she will call back to resch

## 2014-09-05 ENCOUNTER — Other Ambulatory Visit: Payer: Self-pay | Admitting: Nurse Practitioner

## 2014-09-07 DEATH — deceased

## 2015-08-13 IMAGING — CR DG BONE SURVEY MET
9 of 10 series · 9 of 10 positions shown · non-contrast
Comparison: PET-CT dated 04/21/2014.  Bone survey dated 11/29/2013.

CLINICAL DATA: Multiple myeloma

EXAM:
METASTATIC BONE SURVEY

[w chest pa]
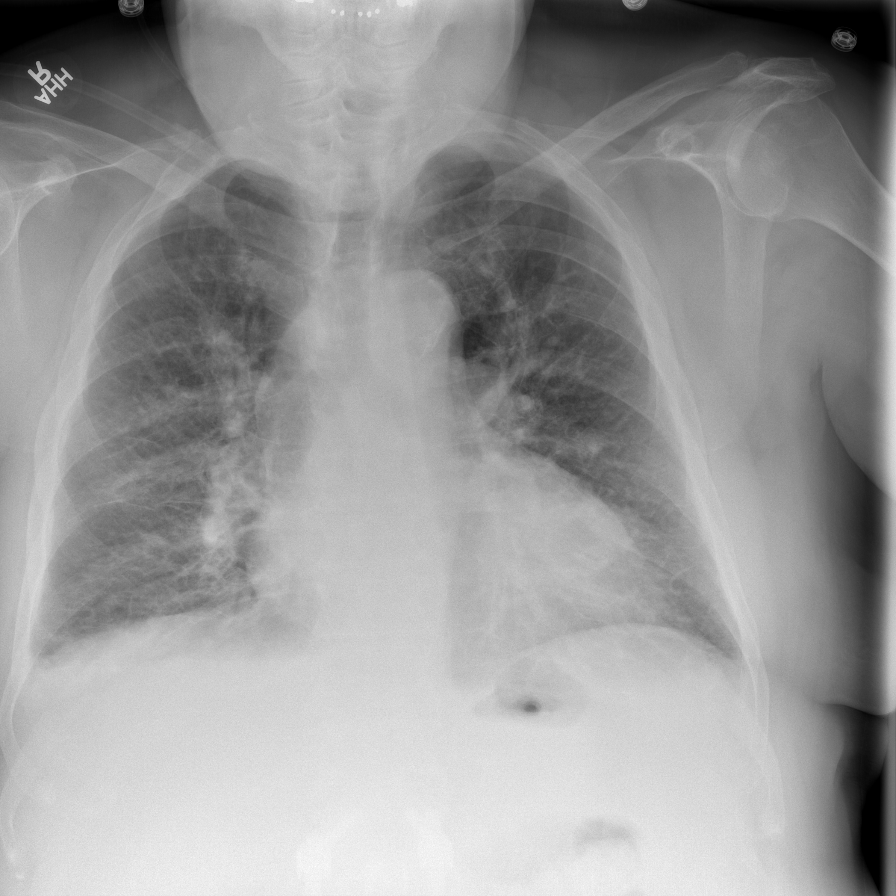

[w skull lat *]
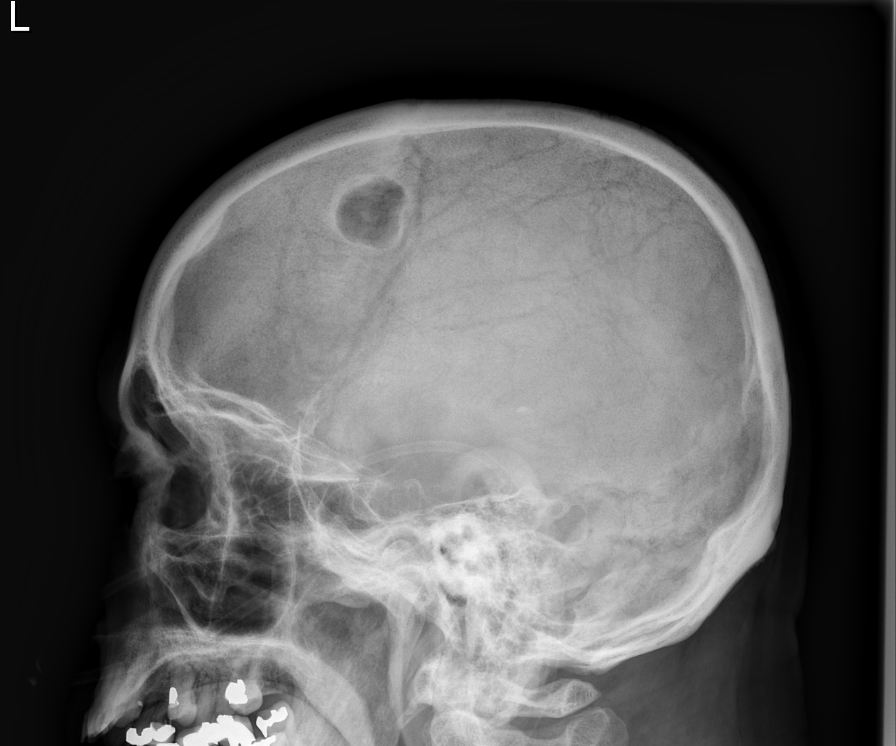

[w c-spine lat]
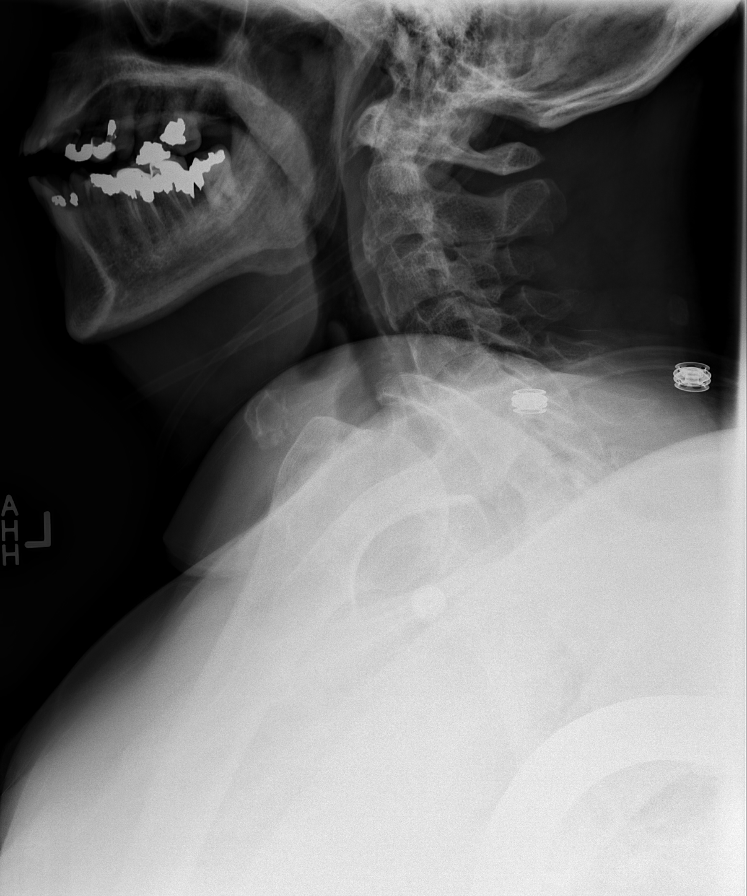

[t c-spine a.p.]
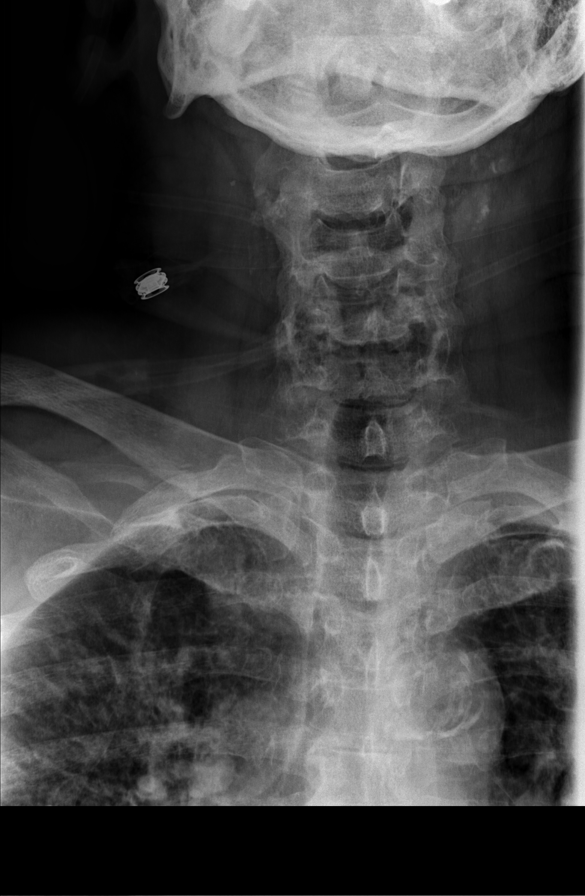

[t t-spine a.p.]
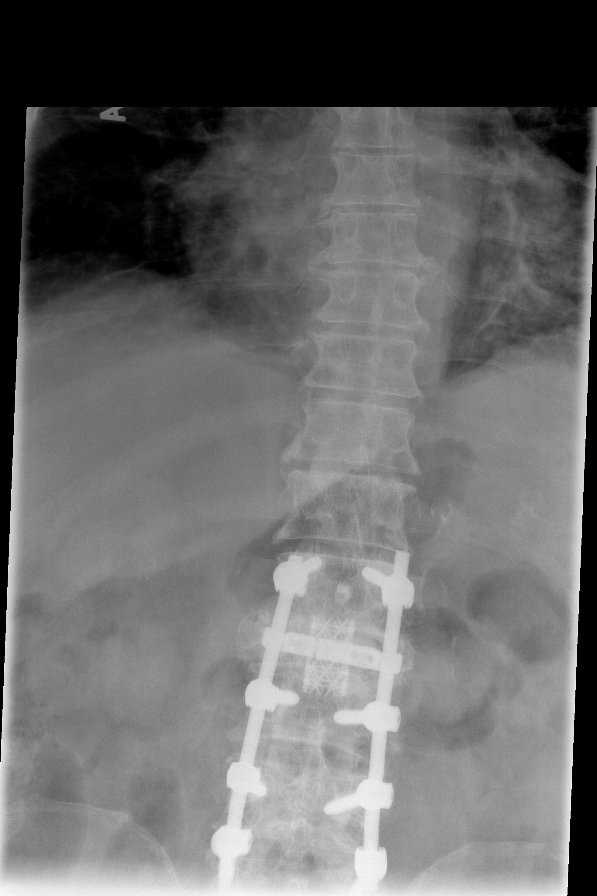

[t l-spine a.p.]
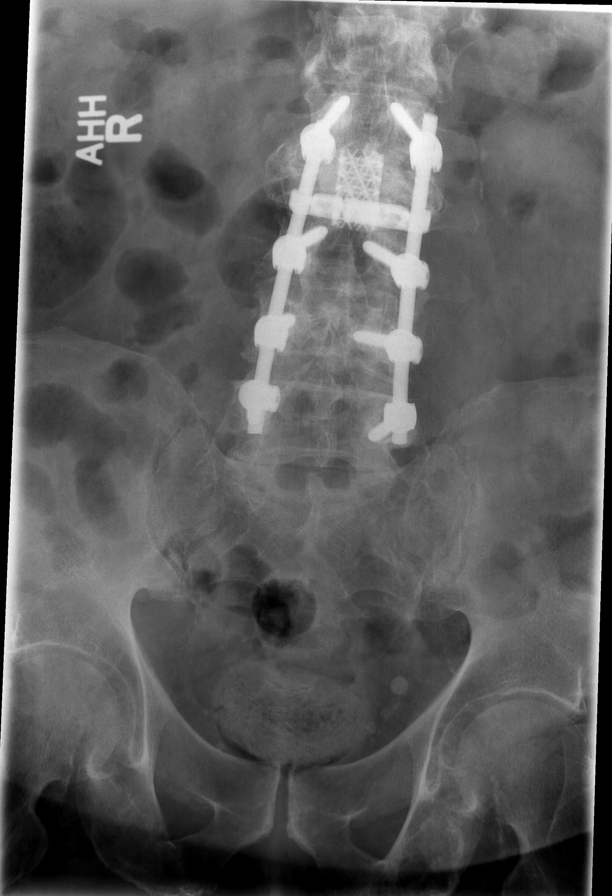

[t pelvis a.p.]
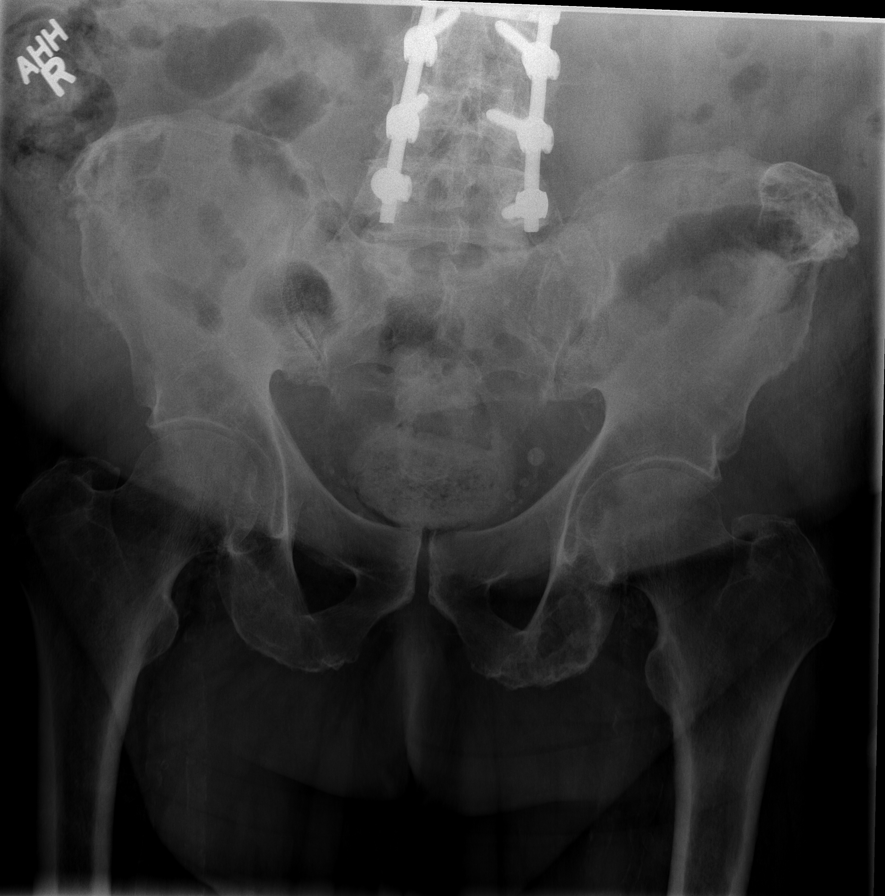

[t femur with hip  ap left]
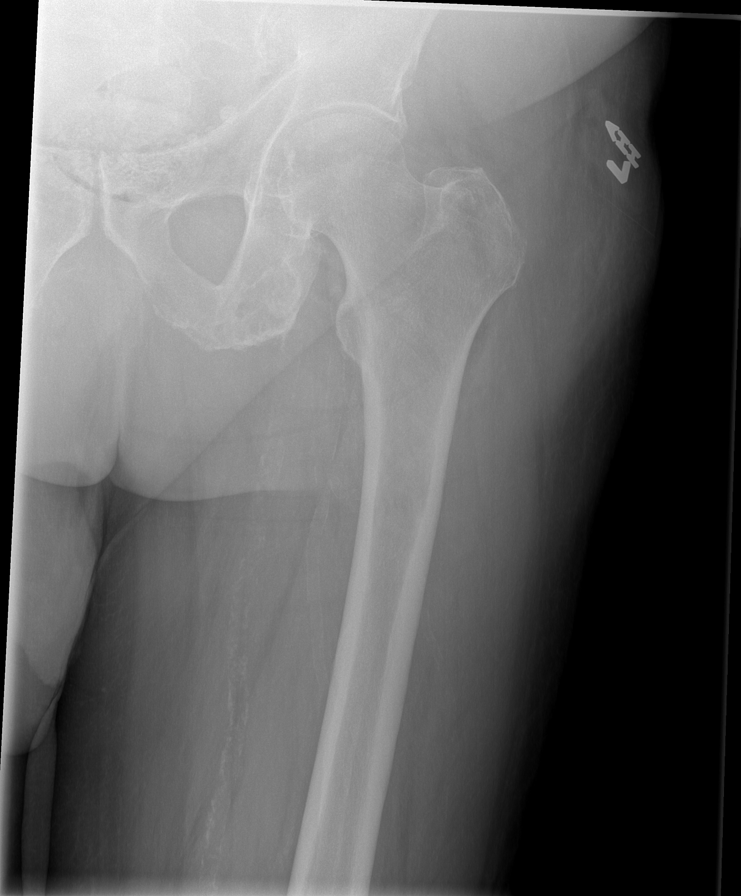

[t femur with knee ap left]
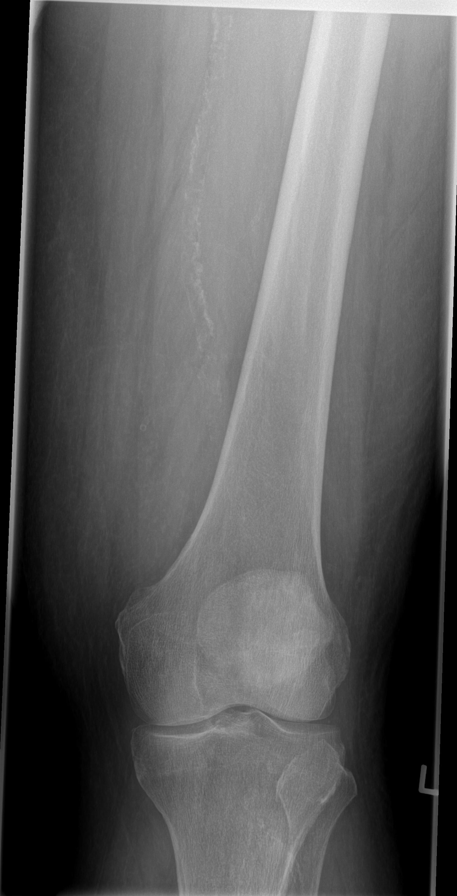

[9 of 10 positions shown; findings below may reference images not displayed]

FINDINGS: 2.4 cm lytic lesion in the calvarium, grossly unchanged.

Degenerative changes of the cervical, thoracic, and lumbar spine.
Stable postsurgical changes involving the lumbar spine.

Expansile mixed sclerotic and lytic lesion involving the left iliac
crest. 1.7 cm lytic lesion in the left inferior pubic ramus,
unchanged.

Visualized bilateral upper and lower extremities are within normal
limits.

Lungs are grossly clear.  Heart is normal in size.
IMPRESSION: Stable lytic lesions in the calvarium and left inferior pubic ramus.

Stable mixed sclerotic and lytic lesion in the left iliac crest.

No evidence of new lesions in the visualized axial and appendicular
skeleton.
